# Patient Record
Sex: Male | Born: 1955 | Race: White | Hispanic: No | Marital: Married | Smoking: Current every day smoker
Health system: Southern US, Community
[De-identification: ages and names within clinical notes are randomized; demographics above are authoritative.]

## PROBLEM LIST (undated history)

## (undated) DIAGNOSIS — T4145XA Adverse effect of unspecified anesthetic, initial encounter: Secondary | ICD-10-CM

## (undated) DIAGNOSIS — C61 Malignant neoplasm of prostate: Secondary | ICD-10-CM

## (undated) DIAGNOSIS — M549 Dorsalgia, unspecified: Secondary | ICD-10-CM

## (undated) DIAGNOSIS — E669 Obesity, unspecified: Secondary | ICD-10-CM

## (undated) DIAGNOSIS — Z87442 Personal history of urinary calculi: Secondary | ICD-10-CM

## (undated) DIAGNOSIS — I739 Peripheral vascular disease, unspecified: Secondary | ICD-10-CM

## (undated) DIAGNOSIS — T8859XA Other complications of anesthesia, initial encounter: Secondary | ICD-10-CM

## (undated) HISTORY — PX: CYSTOSCOPY: SUR368

## (undated) HISTORY — PX: HERNIA REPAIR: SHX51

## (undated) HISTORY — PX: TONSILLECTOMY: SUR1361

## (undated) HISTORY — PX: TONSILLECTOMY AND ADENOIDECTOMY: SUR1326

## (undated) HISTORY — PX: CYST EXCISION: SHX5701

## (undated) HISTORY — DX: Peripheral vascular disease, unspecified: I73.9

---

## 2002-11-17 HISTORY — PX: GASTRIC BYPASS: SHX52

## 2014-02-12 DIAGNOSIS — N201 Calculus of ureter: Secondary | ICD-10-CM | POA: Insufficient documentation

## 2017-06-09 ENCOUNTER — Other Ambulatory Visit: Payer: Self-pay | Admitting: Urology

## 2017-06-09 DIAGNOSIS — R310 Gross hematuria: Secondary | ICD-10-CM

## 2017-06-11 ENCOUNTER — Ambulatory Visit
Admission: RE | Admit: 2017-06-11 | Discharge: 2017-06-11 | Disposition: A | Discharge status: Home / Self Care | Payer: BLUE CROSS/BLUE SHIELD | Source: Ambulatory Visit | Attending: Urology | Admitting: Urology

## 2017-06-11 DIAGNOSIS — N202 Calculus of kidney with calculus of ureter: Secondary | ICD-10-CM | POA: Diagnosis not present

## 2017-06-11 DIAGNOSIS — I700 Atherosclerosis of aorta: Secondary | ICD-10-CM | POA: Diagnosis not present

## 2017-06-11 DIAGNOSIS — R310 Gross hematuria: Secondary | ICD-10-CM | POA: Insufficient documentation

## 2017-06-11 DIAGNOSIS — R918 Other nonspecific abnormal finding of lung field: Secondary | ICD-10-CM | POA: Diagnosis not present

## 2017-06-11 MED ORDER — IOPAMIDOL (ISOVUE-300) INJECTION 61%
150.0000 mL | Freq: Once | INTRAVENOUS | Status: AC | PRN
  Administered 2017-06-11: 125 mL via INTRAVENOUS

## 2017-06-16 ENCOUNTER — Encounter: Payer: Self-pay | Admitting: *Deleted

## 2017-06-16 ENCOUNTER — Ambulatory Visit
Admission: RE | Admit: 2017-06-16 | Discharge: 2017-06-16 | Disposition: A | Discharge status: Home / Self Care | Payer: BLUE CROSS/BLUE SHIELD | Source: Ambulatory Visit | Attending: Urology | Admitting: Urology

## 2017-06-16 ENCOUNTER — Ambulatory Visit: Payer: BLUE CROSS/BLUE SHIELD | Admitting: Anesthesiology

## 2017-06-16 ENCOUNTER — Encounter
Admission: RE | Disposition: A | Discharge status: Home / Self Care | Payer: Self-pay | Source: Ambulatory Visit | Attending: Urology

## 2017-06-16 DIAGNOSIS — N201 Calculus of ureter: Secondary | ICD-10-CM | POA: Insufficient documentation

## 2017-06-16 DIAGNOSIS — Z800 Family history of malignant neoplasm of digestive organs: Secondary | ICD-10-CM | POA: Insufficient documentation

## 2017-06-16 DIAGNOSIS — F17210 Nicotine dependence, cigarettes, uncomplicated: Secondary | ICD-10-CM | POA: Insufficient documentation

## 2017-06-16 DIAGNOSIS — J449 Chronic obstructive pulmonary disease, unspecified: Secondary | ICD-10-CM | POA: Insufficient documentation

## 2017-06-16 DIAGNOSIS — M549 Dorsalgia, unspecified: Secondary | ICD-10-CM | POA: Insufficient documentation

## 2017-06-16 DIAGNOSIS — G8929 Other chronic pain: Secondary | ICD-10-CM | POA: Insufficient documentation

## 2017-06-16 HISTORY — PX: URETEROSCOPY WITH HOLMIUM LASER LITHOTRIPSY: SHX6645

## 2017-06-16 HISTORY — PX: CYSTOSCOPY WITH STENT PLACEMENT: SHX5790

## 2017-06-16 HISTORY — DX: Personal history of urinary calculi: Z87.442

## 2017-06-16 SURGERY — URETEROSCOPY, WITH LITHOTRIPSY USING HOLMIUM LASER
Anesthesia: General | Site: Ureter | Laterality: Left | Wound class: Clean Contaminated

## 2017-06-16 MED ORDER — HYOSCYAMINE SULFATE SL 0.125 MG SL SUBL: 0.1250 mg | SUBLINGUAL_TABLET | SUBLINGUAL | 3 refills | Status: DC | PRN

## 2017-06-16 MED ORDER — FENTANYL CITRATE (PF) 100 MCG/2ML IJ SOLN
INTRAMUSCULAR | Status: AC
  Filled 2017-06-16: qty 2

## 2017-06-16 MED ORDER — CEFAZOLIN (ANCEF) 1 G IV SOLR: 1.0000 g | INTRAVENOUS | Status: DC

## 2017-06-16 MED ORDER — CEFAZOLIN SODIUM-DEXTROSE 1-4 GM/50ML-% IV SOLN
1.0000 g | Freq: Once | INTRAVENOUS | Status: AC
  Administered 2017-06-16: 1 g via INTRAVENOUS

## 2017-06-16 MED ORDER — BELLADONNA ALKALOIDS-OPIUM 16.2-60 MG RE SUPP
RECTAL | Status: AC
  Filled 2017-06-16: qty 1

## 2017-06-16 MED ORDER — FAMOTIDINE 20 MG PO TABS
20.0000 mg | ORAL_TABLET | Freq: Once | ORAL | Status: AC
  Administered 2017-06-16: 20 mg via ORAL

## 2017-06-16 MED ORDER — ROCURONIUM BROMIDE 100 MG/10ML IV SOLN
INTRAVENOUS | Status: DC | PRN
  Administered 2017-06-16: 40 mg via INTRAVENOUS

## 2017-06-16 MED ORDER — LACTATED RINGERS IV SOLN
INTRAVENOUS | Status: DC
  Administered 2017-06-16: 13:00:00 via INTRAVENOUS

## 2017-06-16 MED ORDER — GLYCOPYRROLATE 0.2 MG/ML IJ SOLN
INTRAMUSCULAR | Status: AC
  Filled 2017-06-16: qty 1

## 2017-06-16 MED ORDER — CEFAZOLIN SODIUM-DEXTROSE 1-4 GM/50ML-% IV SOLN
INTRAVENOUS | Status: AC
  Filled 2017-06-16: qty 50

## 2017-06-16 MED ORDER — FENTANYL CITRATE (PF) 100 MCG/2ML IJ SOLN: 25.0000 ug | INTRAMUSCULAR | Status: DC | PRN

## 2017-06-16 MED ORDER — SUGAMMADEX SODIUM 200 MG/2ML IV SOLN
INTRAVENOUS | Status: AC
  Filled 2017-06-16: qty 2

## 2017-06-16 MED ORDER — FAMOTIDINE 20 MG PO TABS
ORAL_TABLET | ORAL | Status: AC
  Administered 2017-06-16: 20 mg via ORAL
  Filled 2017-06-16: qty 1

## 2017-06-16 MED ORDER — LIDOCAINE HCL 2 % EX GEL
CUTANEOUS | Status: AC
  Filled 2017-06-16: qty 10

## 2017-06-16 MED ORDER — MIDAZOLAM HCL 2 MG/2ML IJ SOLN
INTRAMUSCULAR | Status: AC
  Filled 2017-06-16: qty 2

## 2017-06-16 MED ORDER — ONDANSETRON HCL 4 MG/2ML IJ SOLN
INTRAMUSCULAR | Status: AC
  Filled 2017-06-16: qty 2

## 2017-06-16 MED ORDER — ONDANSETRON 8 MG PO TBDP: 4.0000 mg | ORAL_TABLET | Freq: Four times a day (QID) | ORAL | 3 refills | Status: DC | PRN

## 2017-06-16 MED ORDER — MIDAZOLAM HCL 2 MG/2ML IJ SOLN
INTRAMUSCULAR | Status: DC | PRN
  Administered 2017-06-16: 2 mg via INTRAVENOUS

## 2017-06-16 MED ORDER — FUROSEMIDE 10 MG/ML IJ SOLN: 10.0000 mg | Freq: Once | INTRAMUSCULAR | Status: DC

## 2017-06-16 MED ORDER — LIDOCAINE HCL 2 % EX GEL
CUTANEOUS | Status: DC | PRN
  Administered 2017-06-16: 1

## 2017-06-16 MED ORDER — MEPERIDINE HCL 50 MG/ML IJ SOLN: 6.2500 mg | INTRAMUSCULAR | Status: DC | PRN

## 2017-06-16 MED ORDER — IOTHALAMATE MEGLUMINE 43 % IV SOLN
INTRAVENOUS | Status: DC | PRN
  Administered 2017-06-16: 15 mL via URETHRAL

## 2017-06-16 MED ORDER — ROCURONIUM BROMIDE 50 MG/5ML IV SOLN
INTRAVENOUS | Status: AC
  Filled 2017-06-16: qty 1

## 2017-06-16 MED ORDER — GLYCOPYRROLATE 0.2 MG/ML IJ SOLN
INTRAMUSCULAR | Status: DC | PRN
  Administered 2017-06-16: 0.2 mg via INTRAVENOUS

## 2017-06-16 MED ORDER — BELLADONNA ALKALOIDS-OPIUM 16.2-60 MG RE SUPP
RECTAL | Status: DC | PRN
  Administered 2017-06-16: 1 via RECTAL

## 2017-06-16 MED ORDER — SUGAMMADEX SODIUM 200 MG/2ML IV SOLN
INTRAVENOUS | Status: DC | PRN
  Administered 2017-06-16: 140 mg via INTRAVENOUS

## 2017-06-16 MED ORDER — KETOROLAC TROMETHAMINE 30 MG/ML IJ SOLN
INTRAMUSCULAR | Status: AC
  Filled 2017-06-16: qty 1

## 2017-06-16 MED ORDER — ONDANSETRON HCL 4 MG/2ML IJ SOLN
INTRAMUSCULAR | Status: DC | PRN
  Administered 2017-06-16: 4 mg via INTRAVENOUS

## 2017-06-16 MED ORDER — EPHEDRINE SULFATE 50 MG/ML IJ SOLN
INTRAMUSCULAR | Status: DC | PRN
  Administered 2017-06-16: 5 mg via INTRAVENOUS

## 2017-06-16 MED ORDER — PROMETHAZINE HCL 25 MG/ML IJ SOLN: 6.2500 mg | INTRAMUSCULAR | Status: DC | PRN

## 2017-06-16 MED ORDER — TAMSULOSIN HCL 0.4 MG PO CAPS: 0.4000 mg | ORAL_CAPSULE | Freq: Every day | ORAL | 11 refills | Status: DC

## 2017-06-16 MED ORDER — CIPROFLOXACIN HCL 500 MG PO TABS: 500.0000 mg | ORAL_TABLET | Freq: Two times a day (BID) | ORAL | 0 refills | Status: DC

## 2017-06-16 MED ORDER — PROPOFOL 10 MG/ML IV BOLUS
INTRAVENOUS | Status: AC
  Filled 2017-06-16: qty 20

## 2017-06-16 MED ORDER — PROPOFOL 10 MG/ML IV BOLUS
INTRAVENOUS | Status: DC | PRN
  Administered 2017-06-16: 150 mg via INTRAVENOUS
  Administered 2017-06-16: 50 mg via INTRAVENOUS

## 2017-06-16 MED ORDER — FENTANYL CITRATE (PF) 100 MCG/2ML IJ SOLN
INTRAMUSCULAR | Status: DC | PRN
  Administered 2017-06-16: 100 ug via INTRAVENOUS

## 2017-06-16 SURGICAL SUPPLY — 27 items
BAG DRAIN CYSTO-URO LG1000N (MISCELLANEOUS) ×2 IMPLANT
CATH URETL 5X70 OPEN END (CATHETERS) ×2 IMPLANT
CNTNR SPEC 2.5X3XGRAD LEK (MISCELLANEOUS) ×1
CONRAY 43 FOR UROLOGY 50M (MISCELLANEOUS) ×2 IMPLANT
CONT SPEC 4OZ STER OR WHT (MISCELLANEOUS) ×1
CONTAINER SPEC 2.5X3XGRAD LEK (MISCELLANEOUS) ×1 IMPLANT
FIBER LASER 365 (Laser) ×2 IMPLANT
GLOVE BIO SURGEON STRL SZ7 (GLOVE) ×4 IMPLANT
GLOVE BIO SURGEON STRL SZ7.5 (GLOVE) ×2 IMPLANT
GOWN STRL REUS W/ TWL LRG LVL4 (GOWN DISPOSABLE) ×1 IMPLANT
GOWN STRL REUS W/ TWL XL LVL3 (GOWN DISPOSABLE) ×1 IMPLANT
GOWN STRL REUS W/TWL LRG LVL4 (GOWN DISPOSABLE) ×1
GOWN STRL REUS W/TWL XL LVL3 (GOWN DISPOSABLE) ×1
GOWN STRL REUS W/TWL XL LVL4 (GOWN DISPOSABLE) ×2 IMPLANT
GUIDEWIRE STR ZIPWIRE 035X150 (MISCELLANEOUS) ×2 IMPLANT
KIT RM TURNOVER CYSTO AR (KITS) ×2 IMPLANT
PACK CYSTO AR (MISCELLANEOUS) ×2 IMPLANT
SET CYSTO W/LG BORE CLAMP LF (SET/KITS/TRAYS/PACK) ×2 IMPLANT
SOL .9 NS 3000ML IRR  AL (IV SOLUTION) ×1
SOL .9 NS 3000ML IRR UROMATIC (IV SOLUTION) ×1 IMPLANT
SOL PREP PVP 2OZ (MISCELLANEOUS) ×2
SOLUTION PREP PVP 2OZ (MISCELLANEOUS) ×1 IMPLANT
STENT URET 6FRX24 CONTOUR (STENTS) ×2 IMPLANT
STENT URET 6FRX26 CONTOUR (STENTS) IMPLANT
SURGILUBE 2OZ TUBE FLIPTOP (MISCELLANEOUS) ×2 IMPLANT
SYRINGE IRR TOOMEY STRL 70CC (SYRINGE) ×2 IMPLANT
WATER STERILE IRR 1000ML POUR (IV SOLUTION) ×2 IMPLANT

## 2017-06-16 NOTE — Anesthesia Preprocedure Evaluation (Signed)
Anesthesia Evaluation  Patient identified by MRN, date of birth, ID band Patient awake    Reviewed: Allergy & Precautions, NPO status , Patient's Chart, lab work & pertinent test results  History of Anesthesia Complications Negative for: history of anesthetic complications  Airway Mallampati: II  TM Distance: >3 FB Neck ROM: Full    Dental no notable dental hx.    Pulmonary neg sleep apnea, neg COPD, Current Smoker,    breath sounds clear to auscultation- rhonchi (-) wheezing      Cardiovascular Exercise Tolerance: Good (-) hypertension(-) CAD, (-) Past MI and (-) Cardiac Stents  Rhythm:Regular Rate:Normal - Systolic murmurs and - Diastolic murmurs    Neuro/Psych negative neurological ROS  negative psych ROS   GI/Hepatic negative GI ROS, Neg liver ROS,   Endo/Other  negative endocrine ROSneg diabetes  Renal/GU Renal disease: nephrolithiasis.     Musculoskeletal negative musculoskeletal ROS (+)   Abdominal (+) - obese,   Peds  Hematology negative hematology ROS (+)   Anesthesia Other Findings Past Medical History: No date: History of kidney stones     Comment:  5th time treating stones in 15 years   Reproductive/Obstetrics                             Anesthesia Physical Anesthesia Plan  ASA: II  Anesthesia Plan: General   Post-op Pain Management:    Induction: Intravenous  PONV Risk Score and Plan: 0 and Ondansetron  Airway Management Planned: Oral ETT  Additional Equipment:   Intra-op Plan:   Post-operative Plan: Extubation in OR  Informed Consent: I have reviewed the patients History and Physical, chart, labs and discussed the procedure including the risks, benefits and alternatives for the proposed anesthesia with the patient or authorized representative who has indicated his/her understanding and acceptance.   Dental advisory given  Plan Discussed with: CRNA and  Anesthesiologist  Anesthesia Plan Comments:         Anesthesia Quick Evaluation

## 2017-06-16 NOTE — H&P (Signed)
Date of Initial H&P: 06/15/17  History reviewed, patient examined, no change in status, stable for surgery.  

## 2017-06-16 NOTE — Transfer of Care (Signed)
Immediate Anesthesia Transfer of Care Note  Patient: Orpah GreekClarence M Ackroyd  Procedure(s) Performed: Procedure(s): URETEROSCOPY WITH HOLMIUM LASER LITHOTRIPSY (Left) CYSTOSCOPY WITH STENT PLACEMENT (Left)  Patient Location: PACU  Anesthesia Type:General  Level of Consciousness: drowsy and patient cooperative  Airway & Oxygen Therapy: Patient Spontanous Breathing and Patient connected to face mask oxygen  Post-op Assessment: Report given to RN and Post -op Vital signs reviewed and stable  Post vital signs: Reviewed and stable  Last Vitals:  Vitals:   06/16/17 1210 06/16/17 1531  BP: 134/89 123/80  Pulse: (!) 47 (!) 51  Resp: 16   Temp: 36.7 C (!) 36.2 C    Last Pain:  Vitals:   06/16/17 1210  TempSrc: Tympanic  PainSc: 1       Patients Stated Pain Goal: 0 (06/16/17 1210)  Complications: No apparent anesthesia complications

## 2017-06-16 NOTE — Anesthesia Procedure Notes (Signed)
Procedure Name: Intubation Date/Time: 06/16/2017 1:47 PM Performed by: Jonna Clark Pre-anesthesia Checklist: Patient identified, Patient being monitored, Timeout performed, Emergency Drugs available and Suction available Patient Re-evaluated:Patient Re-evaluated prior to induction Oxygen Delivery Method: Circle system utilized Preoxygenation: Pre-oxygenation with 100% oxygen Induction Type: IV induction Ventilation: Mask ventilation without difficulty Laryngoscope Size: Mac and 3 Grade View: Grade II Tube type: Oral Tube size: 7.5 mm Number of attempts: 1 Placement Confirmation: ETT inserted through vocal cords under direct vision,  positive ETCO2 and breath sounds checked- equal and bilateral Secured at: 21 cm Tube secured with: Tape Dental Injury: Teeth and Oropharynx as per pre-operative assessment  Comments: Put lubricant on dry skin of right eye before taping closed

## 2017-06-16 NOTE — Discharge Instructions (Signed)
AMBULATORY SURGERY  DISCHARGE INSTRUCTIONS   1) The drugs that you were given will stay in your system until tomorrow so for the next 24 hours you should not:  A) Drive an automobile B) Make any legal decisions C) Drink any alcoholic beverage   2) You may resume regular meals tomorrow.  Today it is better to start with liquids and gradually work up to solid foods.  You may eat anything you prefer, but it is better to start with liquids, then soup and crackers, and gradually work up to solid foods.   3) Please notify your doctor immediately if you have any unusual bleeding, trouble breathing, redness and pain at the surgery site, drainage, fever, or pain not relieved by medication. 4)   5) Your post-operative visit with Dr.                                     is: Date:                        Time:    Please call to schedule your post-operative visit.  6) Additional Instructions:       Kidney Stones Kidney stones (urolithiasis) are rock-like masses that form inside of the kidneys. Kidneys are organs that make pee (urine). A kidney stone can cause very bad pain and can block the flow of pee. The stone usually leaves your body (passes) through your pee. You may need to have a doctor take out the stone. Follow these instructions at home: Eating and drinking  Drink enough fluid to keep your pee clear or pale yellow. This will help you pass the stone.  If told by your doctor, change the foods you eat (your diet). This may include: ? Limiting how much salt (sodium) you eat. ? Eating more fruits and vegetables. ? Limiting how much meat, poultry, fish, and eggs you eat.  Follow instructions from your doctor about eating or drinking restrictions. General instructions  Collect pee samples as told by your doctor. You may need to collect a pee sample: ? 24 hours after a stone comes out. ? 8-12 weeks after a stone comes out, and every 6-12 months after that.  Strain your pee every  time you pee (urinate), for as long as told. Use the strainer that your doctor recommends.  Do not throw out the stone. Keep it so that it can be tested by your doctor.  Take over-the-counter and prescription medicines only as told by your doctor.  Keep all follow-up visits as told by your doctor. This is important. You may need follow-up tests. Preventing kidney stones To prevent another kidney stone:  Drink enough fluid to keep your pee clear or pale yellow. This is the best way to prevent kidney stones.  Eat healthy foods.  Avoid certain foods as told by your doctor. You may be told to eat less protein.  Stay at a healthy weight.  Contact a doctor if:  You have pain that gets worse or does not get better with medicine. Get help right away if:  You have a fever or chills.  You get very bad pain.  You get new pain in your belly (abdomen).  You pass out (faint).  You cannot pee. This information is not intended to replace advice given to you by your health care provider. Make sure you discuss any questions you have  with your health care provider. Document Released: 04/21/2008 Document Revised: 07/22/2016 Document Reviewed: 07/22/2016 Elsevier Interactive Patient Education  2017 [employer]   Laser Therapy for Kidney Stones, Care After Refer to this sheet in the next few weeks. These instructions provide you with information on caring for yourself after your procedure. Your health care provider may also give you more specific instructions. Your treatment has been planned according to current medical practices, but problems sometimes occur. Call your health care provider if you have any problems or questions after your procedure. What can I expect after the procedure? After the procedure, it is common to have:  Pain.  A burning sensation while urinating.  Small amounts of blood in your urine.  A need to urinate frequently.  Pieces of kidney stone in your  urine.  Mild discomfort when urinating that is felt in the tip of the penis in men or in the back. You may experience this if you have a flexible tube (stent) in your ureter.  Follow these instructions at home:  Take over-the-counter and prescription medicines only as told by your health care provider.  Take your antibiotic medicine as told by your health care provider. Do not stop taking the antibiotic even if you start to feel better.  Drink enough fluid to keep your urine clear or pale yellow. Your health care provider may recommend drinking two 8-ounce glasses of water per hour for a few hours after your procedure.  Return to your normal activities as told by your health care provider. Ask your health care provider what activities are safe for you.  If your health care provider approves, you may take a warm bath to ease discomfort and burning.  Do notdrivefor 24 hours if you were given a medicine to help you relax (sedative).  Keep all follow-up visits as told by your health care provider. This is important. If you have a stent, you will need to return to your health care provider to have the stent removed. Contact a health care provider if:  You have pain or a burning feeling that lasts more than two days.  You feel nauseous.  You vomit more and more often.  You have difficulty urinating.  You have pain that gets worse or does not get better with medicine. Get help right away if:  You are unable to urinate, even if your bladder feels full.  You have bright red blood or blood clots in your urine.  You have more blood in your urine.  You have severe pain or discomfort.  You have a fever or shaking chills.  You have abdominal pain. This information is not intended to replace advice given to you by your health care provider. Make sure you discuss any questions you have with your health care provider. Document Released: 11/30/2015 Document Revised: 04/10/2016 Document  Reviewed: 09/27/2015 Elsevier Interactive Patient Education  [employer]2018 [employer]   Ureteroscopy Ureteroscopy is a procedure to check for and treat problems inside part of the urinary tract. In this procedure, a thin, tube-shaped instrument with a light at the end (ureteroscope) is used to look at the inside of the kidneys and the ureters, which are the tubes that carry urine from the kidneys to the bladder. The ureteroscope is inserted into one or both of the ureters. You may need this procedure if you have frequent urinary tract infections (UTIs), blood in your urine, or a stone in one of your ureters. A ureteroscopy can be done to find  the cause of urine blockage in a ureter and to evaluate other abnormalities inside the ureters or kidneys. If stones are found, they can be removed during the procedure. Polyps, abnormal tissue, and some types of tumors can also be removed or treated. The ureteroscope may also have a tool to remove tissue to be checked for disease under a microscope (biopsy). Tell a health care provider about:  Any allergies you have.  All medicines you are taking, including vitamins, herbs, eye drops, creams, and over-the-counter medicines.  Any problems you or family members have had with anesthetic medicines.  Any blood disorders you have.  Any surgeries you have had.  Any medical conditions you have.  Whether you are pregnant or may be pregnant. What are the risks? Generally, this is a safe procedure. However, problems may occur, including:  Bleeding.  Infection.  Allergic reactions to medicines.  Scarring that narrows the ureter (stricture).  Creating a hole in the ureter (perforation).  What happens before the procedure? Staying hydrated Follow instructions from your health care provider about hydration, which may include:  Up to 2 hours before the procedure - you may continue to drink clear liquids, such as water, clear fruit juice, black coffee, and  plain tea.  Eating and drinking restrictions Follow instructions from your health care provider about eating and drinking, which may include:  8 hours before the procedure - stop eating heavy meals or foods such as meat, fried foods, or fatty foods.  6 hours before the procedure - stop eating light meals or foods, such as toast or cereal.  6 hours before the procedure - stop drinking milk or drinks that contain milk.  2 hours before the procedure - stop drinking clear liquids.  Medicines  Ask your health care provider about: ? Changing or stopping your regular medicines. This is especially important if you are taking diabetes medicines or blood thinners. ? Taking medicines such as aspirin and ibuprofen. These medicines can thin your blood. Do not take these medicines before your procedure if your health care provider instructs you not to.  You may be given antibiotic medicine to help prevent infection. General instructions  You may have a urine sample taken to check for infection.  Plan to have someone take you home from the hospital or clinic. What happens during the procedure?  To reduce your risk of infection: ? Your health care team will wash or sanitize their hands. ? Your skin will be washed with soap.  An IV tube will be inserted into one of your veins.  You will be given one of the following: ? A medicine to help you relax (sedative). ? A medicine to make you fall asleep (general anesthetic). ? A medicine that is injected into your spine to numb the area below and slightly above the injection site (spinal anesthetic).  To lower your risk of infection, you may be given an antibiotic medicine by an injection or through the IV tube.  The opening from which you urinate (urethra) will be cleaned with a germ-killing solution.  The ureteroscope will be passed through your urethra into your bladder.  A salt-water solution will flow through the ureteroscope to fill your  bladder. This will help the health care provider see the openings of your ureters more clearly.  Then, the ureteroscope will be passed into your ureter. ? If a growth is found, a piece of it may be removed so it can be examined under a microscope (biopsy). ? If  a stone is found, it may be removed through the ureteroscope, or the stone may be broken up using a laser, shock waves, or electrical energy. ? In some cases, if the ureter is too small, a tube may be inserted that keeps the ureter open (ureteral stent). The stent may be left in place for 1 or 2 weeks to keep the ureter open, and then the ureteroscopy procedure will be performed.  The scope will be removed, and your bladder will be emptied. The procedure may vary among health care providers and hospitals. What happens after the procedure?  Your blood pressure, heart rate, breathing rate, and blood oxygen level will be monitored until the medicines you were given have worn off.  You may be asked to urinate.  Donot drive for 24 hours if you were given a sedative. This information is not intended to replace advice given to you by your health care provider. Make sure you discuss any questions you have with your health care provider. Document Released: 11/08/2013 Document Revised: 08/19/2016 Document Reviewed: 08/15/2016 Elsevier Interactive Patient Education  2018 [employer]  Ureteral Stent Implantation, Care After Refer to this sheet in the next few weeks. These instructions provide you with information about caring for yourself after your procedure. Your health care provider may also give you more specific instructions. Your treatment has been planned according to current medical practices, but problems sometimes occur. Call your health care provider if you have any problems or questions after your procedure. What can I expect after the procedure? After the procedure, it is common to have:  Nausea.  Mild pain when you urinate.  You may feel this pain in your lower back or lower abdomen. Pain should stop within a few minutes after you urinate. This may last for up to 1 week.  A small amount of blood in your urine for several days.  Follow these instructions at home:  Medicines  Take over-the-counter and prescription medicines only as told by your health care provider.  If you were prescribed an antibiotic medicine, take it as told by your health care provider. Do not stop taking the antibiotic even if you start to feel better.  Do not drive for 24 hours if you received a sedative.  Do not drive or operate heavy machinery while taking prescription pain medicines. Activity  Return to your normal activities as told by your health care provider. Ask your health care provider what activities are safe for you.  Do not lift anything that is heavier than 10 lb (4.5 kg). Follow this limit for 1 week after your procedure, or for as long as told by your health care provider. General instructions  Watch for any blood in your urine. Call your health care provider if the amount of blood in your urine increases.  If you have a catheter: ? Follow instructions from your health care provider about taking care of your catheter and collection bag. ? Do not take baths, swim, or use a hot tub until your health care provider approves.  Drink enough fluid to keep your urine clear or pale yellow.  Keep all follow-up visits as told by your health care provider. This is important. Contact a health care provider if:  You have pain that gets worse or does not get better with medicine, especially pain when you urinate.  You have difficulty urinating.  You feel nauseous or you vomit repeatedly during a period of more than 2 days after the procedure. Get help  right away if:  Your urine is dark red or has blood clots in it.  You are leaking urine (have incontinence).  The end of the stent comes out of your urethra.  You cannot  urinate.  You have sudden, sharp, or severe pain in your abdomen or lower back.  You have a fever. This information is not intended to replace advice given to you by your health care provider. Make sure you discuss any questions you have with your health care provider. Document Released: 07/06/2013 Document Revised: 04/10/2016 Document Reviewed: 05/18/2015 Elsevier Interactive Patient Education  [employer].

## 2017-06-16 NOTE — Anesthesia Postprocedure Evaluation (Signed)
Anesthesia Post Note  Patient: Thomas Huerta  Procedure(s) Performed: Procedure(s) (LRB): URETEROSCOPY WITH HOLMIUM LASER LITHOTRIPSY (Left) CYSTOSCOPY WITH STENT PLACEMENT (Left)  Patient location during evaluation: PACU Anesthesia Type: General Level of consciousness: awake and alert and oriented Pain management: pain level controlled Vital Signs Assessment: post-procedure vital signs reviewed and stable Respiratory status: spontaneous breathing Cardiovascular status: blood pressure returned to baseline Anesthetic complications: no     Last Vitals:  Vitals:   06/16/17 1615 06/16/17 1630  BP: (!) 149/91 (!) 143/95  Pulse: (!) 55 (!) 44  Resp: 20 13  Temp:      Last Pain:  Vitals:   06/16/17 1630  TempSrc:   PainSc: 0-No pain                 Sanyiah Kanzler

## 2017-06-16 NOTE — Anesthesia Post-op Follow-up Note (Cosign Needed)
Anesthesia QCDR form completed.        

## 2017-06-16 NOTE — Progress Notes (Signed)
Patient was on cpap about 20 years ago until he had a T & A along with gastric bypass

## 2017-06-16 NOTE — Op Note (Signed)
Preoperative diagnosis: Left ureterolithiasis  Postoperative diagnosis: Same   Procedure: 1. Left ureteroscopic ureterolithotomy with holmium laser lithotripsy                      2. Left double pigtail ureteral stent placement                      3. Fluoroscopy    Surgeon: Suszanne ConnersMichael R. Evelene CroonWolff MD  Anesthesia: General  Indications:See the history and physical. After informed consent the above procedure(s) were requested     Technique and findings: After adequate general anesthesia been obtained the patient was placed into dorsal lithotomy position and the perineum was prepped and draped in the usual fashion. Fluoroscopy confirmed the presence of a 2 x 1 cm stone in distal left ureter. Bilateral renal stones were also present. The 3021 French the scope was coupled to the camera and visually advanced into the bladder. Bladder was thoroughly inspected. Both ureteral orifices were identified and had clear efflux. No bladder tumors were identified. A 0.035 Glidewire was then advanced up the ureter beyond the stone and curled into the renal pelvis. The cystoscope was removed taking care to leave the guidewire in position. The 4.5 short semirigid ureteroscope is coupled the camera and then visually advanced into the distal ureter. The distal end of the stone was identified. The  365  holmium laser fiber was introduced through the scope and set at our level of 1 and a frequency of 5. The stone was powdered and fragmented into fragments less than 1 mm in size. The ureteroscope was then removed and the cystoscope backloaded over the guidewire. A 6 French by 24 cm double pigtail stent was then advanced over the guidewire and position in the ureter. Guidewire was then removed taking care leaving the stent in place. The bladder was drained and cystoscope was removed. 10 cc of viscous Xylocaine was instilled within the urethra. A B&O suppository was placed. The procedure was then terminated and patient transferred to  the recovery room in stable condition.

## 2017-06-23 ENCOUNTER — Ambulatory Visit: Payer: Self-pay

## 2017-06-24 ENCOUNTER — Encounter: Payer: Self-pay | Admitting: *Deleted

## 2017-06-25 ENCOUNTER — Ambulatory Visit
Admission: RE | Admit: 2017-06-25 | Discharge: 2017-06-25 | Disposition: A | Discharge status: Home / Self Care | Payer: BLUE CROSS/BLUE SHIELD | Source: Ambulatory Visit | Attending: Urology | Admitting: Urology

## 2017-06-25 ENCOUNTER — Encounter: Payer: Self-pay | Admitting: *Deleted

## 2017-06-25 ENCOUNTER — Encounter
Admission: RE | Disposition: A | Discharge status: Home / Self Care | Payer: Self-pay | Source: Ambulatory Visit | Attending: Urology

## 2017-06-25 DIAGNOSIS — Z800 Family history of malignant neoplasm of digestive organs: Secondary | ICD-10-CM | POA: Insufficient documentation

## 2017-06-25 DIAGNOSIS — Z79899 Other long term (current) drug therapy: Secondary | ICD-10-CM | POA: Insufficient documentation

## 2017-06-25 DIAGNOSIS — Z9884 Bariatric surgery status: Secondary | ICD-10-CM | POA: Insufficient documentation

## 2017-06-25 DIAGNOSIS — N200 Calculus of kidney: Secondary | ICD-10-CM | POA: Insufficient documentation

## 2017-06-25 DIAGNOSIS — Z7982 Long term (current) use of aspirin: Secondary | ICD-10-CM | POA: Diagnosis not present

## 2017-06-25 DIAGNOSIS — F17210 Nicotine dependence, cigarettes, uncomplicated: Secondary | ICD-10-CM | POA: Insufficient documentation

## 2017-06-25 HISTORY — DX: Obesity, unspecified: E66.9

## 2017-06-25 HISTORY — DX: Dorsalgia, unspecified: M54.9

## 2017-06-25 HISTORY — PX: EXTRACORPOREAL SHOCK WAVE LITHOTRIPSY: SHX1557

## 2017-06-25 SURGERY — LITHOTRIPSY, ESWL
Anesthesia: Moderate Sedation | Laterality: Left

## 2017-06-25 MED ORDER — DIPHENHYDRAMINE HCL 25 MG PO CAPS
25.0000 mg | ORAL_CAPSULE | ORAL | Status: AC
  Administered 2017-06-25: 25 mg via ORAL

## 2017-06-25 MED ORDER — MIDAZOLAM HCL 2 MG/2ML IJ SOLN
1.0000 mg | Freq: Once | INTRAMUSCULAR | Status: AC
  Administered 2017-06-25: 1 mg via INTRAMUSCULAR

## 2017-06-25 MED ORDER — FUROSEMIDE 10 MG/ML IJ SOLN
10.0000 mg | Freq: Once | INTRAMUSCULAR | Status: AC
  Administered 2017-06-25: 10 mg via INTRAVENOUS

## 2017-06-25 MED ORDER — ONDANSETRON 8 MG PO TBDP: 4.0000 mg | ORAL_TABLET | Freq: Four times a day (QID) | ORAL | 3 refills | Status: DC | PRN

## 2017-06-25 MED ORDER — MORPHINE SULFATE (PF) 10 MG/ML IV SOLN
INTRAVENOUS | Status: AC
  Administered 2017-06-25: 10 mg via INTRAMUSCULAR
  Filled 2017-06-25: qty 1

## 2017-06-25 MED ORDER — MORPHINE SULFATE (PF) 10 MG/ML IV SOLN
10.0000 mg | Freq: Once | INTRAVENOUS | Status: AC
  Administered 2017-06-25: 10 mg via INTRAMUSCULAR

## 2017-06-25 MED ORDER — DIPHENHYDRAMINE HCL 25 MG PO CAPS
ORAL_CAPSULE | ORAL | Status: AC
  Administered 2017-06-25: 25 mg via ORAL
  Filled 2017-06-25: qty 1

## 2017-06-25 MED ORDER — DEXTROSE 5 % IV SOLN
1.0000 g | Freq: Once | INTRAVENOUS | Status: DC
  Filled 2017-06-25: qty 10

## 2017-06-25 MED ORDER — PROMETHAZINE HCL 25 MG/ML IJ SOLN
25.0000 mg | Freq: Once | INTRAMUSCULAR | Status: AC
  Administered 2017-06-25: 25 mg via INTRAMUSCULAR

## 2017-06-25 MED ORDER — PROMETHAZINE HCL 25 MG/ML IJ SOLN
INTRAMUSCULAR | Status: AC
  Administered 2017-06-25: 25 mg via INTRAMUSCULAR
  Filled 2017-06-25: qty 1

## 2017-06-25 MED ORDER — FUROSEMIDE 10 MG/ML IJ SOLN
INTRAMUSCULAR | Status: AC
  Filled 2017-06-25: qty 2

## 2017-06-25 MED ORDER — DEXTROSE-NACL 5-0.45 % IV SOLN: INTRAVENOUS | Status: DC

## 2017-06-25 MED ORDER — MIDAZOLAM HCL 2 MG/2ML IJ SOLN
INTRAMUSCULAR | Status: AC
  Administered 2017-06-25: 1 mg via INTRAMUSCULAR
  Filled 2017-06-25: qty 2

## 2017-06-25 NOTE — Discharge Instructions (Addendum)
Kidney Stones Kidney stones (urolithiasis) are solid, rock-like deposits that form inside of the organs that make urine (kidneys). A kidney stone may form in a kidney and move into the bladder, where it can cause intense pain and block the flow of urine. Kidney stones are created when high levels of certain minerals are found in the urine. They are usually passed through urination, but in some cases, medical treatment may be needed to remove them. What are the causes? Kidney stones may be caused by:  A condition in which certain glands produce too much parathyroid hormone (primary hyperparathyroidism), which causes too much calcium buildup in the blood.  Buildup of uric acid crystals in the bladder (hyperuricosuria). Uric acid is a chemical that the body produces when you eat certain foods. It usually exits the body in the urine.  Narrowing (stricture) of one or both of the tubes that drain urine from the kidneys to the bladder (ureters).  A kidney blockage that is present at birth (congenital obstruction).  Past surgery on the kidney or the ureters, such as gastric bypass surgery.  What increases the risk? The following factors make you more likely to develop kidney stones:  Having had a kidney stone in the past.  Having a family history of kidney stones.  Not drinking enough water.  Eating a diet that is high in protein, salt (sodium), or sugar.  Being overweight or obese.  What are the signs or symptoms? Symptoms of a kidney stone may include:  Nausea.  Vomiting.  Blood in the urine (hematuria).  Pain in the side of the abdomen, right below the ribs (flank pain). Pain usually spreads (radiates) to the groin.  Needing to urinate frequently or urgently.  How is this diagnosed? This condition may be diagnosed based on:  Your medical history.  A physical exam.  Blood tests.  Urine tests.  CT scan.  Abdominal X-ray.  A procedure to examine the inside of the  bladder (cystoscopy).  How is this treated? Treatment for kidney stones depends on the size, location, and makeup of the stones. Treatment may involve:  Analyzing your urine before and after you pass the stone through urination.  Being monitored at the hospital until you pass the stone through urination.  Increasing your fluid intake and decreasing the amount of calcium and protein in your diet.  A procedure to break up kidney stones in the bladder using: ? A focused beam of light (laser therapy). ? Shock waves (extracorporeal shock wave lithotripsy).  Surgery to remove kidney stones. This may be needed if you have severe pain or have stones that block your urinary tract.  Follow these instructions at home: Eating and drinking   Drink enough fluid to keep your urine clear or pale yellow. This will help you to pass the kidney stone.  If directed, change your diet. This may include: ? Limiting how much sodium you eat. ? Eating more fruits and vegetables. ? Limiting how much meat, poultry, fish, and eggs you eat.  Follow instructions from your health care provider about eating or drinking restrictions. General instructions  Collect urine samples as told by your health care provider. You may need to collect a urine sample: ? 24 hours after you pass the stone. ? 8-12 weeks after passing the kidney stone, and every 6-12 months after that.  Strain your urine every time you urinate, for as long as directed. Use the strainer that your health care provider recommends.  Do not throw out  the kidney stone after passing it. Keep the stone so it can be tested by your health care provider. Testing the makeup of your kidney stone may help prevent you from getting kidney stones in the future. °· Take over-the-counter and prescription medicines only as told by your health care provider. °· Keep all follow-up visits as told by your health care provider. This is important. You may need follow-up  X-rays or ultrasounds to make sure that your stone has passed. °How is this prevented? °To prevent another kidney stone: °· Drink enough fluid to keep your urine clear or pale yellow. This is the best way to prevent kidney stones. °· Eat a healthy diet and follow recommendations from your health care provider about foods to avoid. You may be instructed to eat a low-protein diet. Recommendations vary depending on the type of kidney stone that you have. °· Maintain a healthy weight. ° °Contact a health care provider if: °· You have pain that gets worse or does not get better with medicine. °Get help right away if: °· You have a fever or chills. °· You develop severe pain. °· You develop new abdominal pain. °· You faint. °· You are unable to urinate. °This information is not intended to replace advice given to you by your health care provider. Make sure you discuss any questions you have with your health care provider. °Document Released: 11/03/2005 Document Revised: 05/23/2016 Document Reviewed: 04/18/2016 °Elsevier Interactive Patient Education © 2017 [employer] ° ° °Lithotripsy, Care After °This sheet gives you information about how to care for yourself after your procedure. Your health care provider may also give you more specific instructions. If you have problems or questions, contact your health care provider. °What can I expect after the procedure? °After the procedure, it is common to have: °· Some blood in your urine. This should only last for a few days. °· Soreness in your back, sides, or upper abdomen for a few days. °· Blotches or bruises on your back where the pressure wave entered the skin. °· Pain, discomfort, or nausea when pieces (fragments) of the kidney stone move through the tube that carries urine from the kidney to the bladder (ureter). Stone fragments may pass soon after the procedure, but they may continue to pass for up to 4-8 weeks. °? If you have severe pain or nausea, contact your  health care provider. This may be caused by a large stone that was not broken up, and this may mean that you need more treatment. °· Some pain or discomfort during urination. °· Some pain or discomfort in the lower abdomen or (in men) at the base of the penis. ° °Follow these instructions at home: °Medicines °· Take over-the-counter and prescription medicines only as told by your health care provider. °· If you were prescribed an antibiotic medicine, take it as told by your health care provider. Do not stop taking the antibiotic even if you start to feel better. °· Do not drive for 24 hours if you were given a medicine to help you relax (sedative). °· Do not drive or use heavy machinery while taking prescription pain medicine. °Eating and drinking °· Drink enough water and fluids to keep your urine clear or pale yellow. This helps any remaining pieces of the stone to pass. It can also help prevent new stones from forming. °· Eat plenty of fresh fruits and vegetables. °· Follow instructions from your health care provider about eating and drinking restrictions. You may be instructed: °?   To reduce how much salt (sodium) you eat or drink. Check ingredients and nutrition facts on packaged foods and beverages. ? To reduce how much meat you eat.  Eat the recommended amount of calcium for your age and gender. Ask your health care provider how much calcium you should have. General instructions  Get plenty of rest.  Most people can resume normal activities 1-2 days after the procedure. Ask your health care provider what activities are safe for you.  If directed, strain all urine through the strainer that was provided by your health care provider. ? Keep all fragments for your health care provider to see. Any stones that are found may be sent to a medical lab for examination. The stone may be as small as a grain of salt.  Keep all follow-up visits as told by your health care provider. This is important. Contact a  health care provider if:  You have pain that is severe or does not get better with medicine.  You have nausea that is severe or does not go away.  You have blood in your urine longer than your health care provider told you to expect.  You have more blood in your urine.  You have pain during urination that does not go away.  You urinate more frequently than usual and this does not go away.  You develop a rash or any other possible signs of an allergic reaction. Get help right away if:  You have severe pain in your back, sides, or upper abdomen.  You have severe pain while urinating.  Your urine is very dark red.  You have blood in your stool (feces).  You cannot pass any urine at all.  You feel a strong urge to urinate after emptying your bladder.  You have a fever or chills.  You develop shortness of breath, difficulty breathing, or chest pain.  You have severe nausea that leads to persistent vomiting.  You faint. Summary  After this procedure, it is common to have some pain, discomfort, or nausea when pieces (fragments) of the kidney stone move through the tube that carries urine from the kidney to the bladder (ureter). If this pain or nausea is severe, however, you should contact your health care provider.  Most people can resume normal activities 1-2 days after the procedure. Ask your health care provider what activities are safe for you.  Drink enough water and fluids to keep your urine clear or pale yellow. This helps any remaining pieces of the stone to pass, and it can help prevent new stones from forming.  If directed, strain your urine and keep all fragments for your health care provider to see. Fragments or stones may be as small as a grain of salt.  Get help right away if you have severe pain in your back, sides, or upper abdomen or have severe pain while urinating. This information is not intended to replace advice given to you by your health care provider.  Make sure you discuss any questions you have with your health care provider. Document Released: 11/23/2007 Document Revised: 09/24/2016 Document Reviewed: 09/24/2016 Elsevier Interactive Patient Education  2017 [place].   Lithotripsy Lithotripsy is a treatment that can sometimes help eliminate kidney stones and the pain that they cause. A form of lithotripsy, also known as extracorporeal shock wave lithotripsy, is a nonsurgical procedure that crushes a kidney stone with shock waves. These shock waves pass through your body and focus on the kidney stone. They cause the  kidney stone to break up while it is still in the urinary tract. This makes it easier for the smaller pieces of stone to pass in the urine. °Tell a health care provider about: °· Any allergies you have. °· All medicines you are taking, including vitamins, herbs, eye drops, creams, and over-the-counter medicines. °· Any blood disorders you have. °· Any surgeries you have had. °· Any medical conditions you have. °· Whether you are pregnant or may be pregnant. °· Any problems you or family members have had with anesthetic medicines. °What are the risks? °Generally, this is a safe procedure. However, problems may occur, including: °· Infection. °· Bleeding of the kidney. °· Bruising of the kidney or skin. °· Scarring of the kidney, which can lead to: °? Increased blood pressure. °? Poor kidney function. °? Return (recurrence) of kidney stones. °· Damage to other structures or organs, such as the liver, colon, spleen, or pancreas. °· Blockage (obstruction) of the the tube that carries urine from the kidney to the bladder (ureter). °· Failure of the kidney stone to break into pieces (fragments). ° °What happens before the procedure? °Staying hydrated °Follow instructions from your health care provider about hydration, which may include: °· Up to 2 hours before the procedure - you may continue to drink clear liquids, such as water, clear fruit  juice, black coffee, and plain tea. ° °Eating and drinking restrictions °Follow instructions from your health care provider about eating and drinking, which may include: °· 8 hours before the procedure - stop eating heavy meals or foods such as meat, fried foods, or fatty foods. °· 6 hours before the procedure - stop eating light meals or foods, such as toast or cereal. °· 6 hours before the procedure - stop drinking milk or drinks that contain milk. °· 2 hours before the procedure - stop drinking clear liquids. ° °General instructions °· Plan to have someone take you home from the hospital or clinic. °· Ask your health care provider about: °? Changing or stopping your regular medicines. This is especially important if you are taking diabetes medicines or blood thinners. °? Taking medicines such as aspirin and ibuprofen. These medicines and other NSAIDs can thin your blood. Do not take these medicines for 7 days before your procedure if your health care provider instructs you not to. °· You may have tests, such as: °? Blood tests. °? Urine tests. °? Imaging tests, such as a CT scan. °What happens during the procedure? °· To lower your risk of infection: °? Your health care team will wash or sanitize their hands. °? Your skin will be washed with soap. °· An IV tube will be inserted into one of your veins. This tube will give you fluids and medicines. °· You will be given one or more of the following: °? A medicine to help you relax (sedative). °? A medicine to make you fall asleep (general anesthetic). °· A water-filled cushion may be placed behind your kidney or on your abdomen. In some cases you may be placed in a tub of lukewarm water. °· Your body will be positioned in a way that makes it easy to target the kidney stone. °· A flexible tube with holes in it (stent) may be placed in the ureter. This will help keep urine flowing from the kidney if the fragments of the stone have been blocking the ureter. °· An X-ray  or ultrasound exam will be done to locate your stone. °· Shock waves will be   aimed at the stone. If you are awake, you may feel a tapping sensation as the shock waves pass through your body. The procedure may vary among health care providers and hospitals. What happens after the procedure?  You may have an X-ray to see whether the procedure was able to break up the kidney stone and how much of the stone has passed. If large stone fragments remain after treatment, you may need to have a second procedure at a later time.  Your blood pressure, heart rate, breathing rate, and blood oxygen level will be monitored until the medicines you were given have worn off.  You may be given antibiotics or pain medicine as needed.  If a stent was placed in your ureter during surgery, it may stay in place for a few weeks.  You may need strain your urine to collect pieces of the kidney stone for testing.  You will need to drink plenty of water.  Do not drive for 24 hours if you were given a sedative. Summary  Lithotripsy is a treatment that can sometimes help eliminate kidney stones and the pain that they cause.  A form of lithotripsy, also known as extracorporeal shock wave lithotripsy, is a nonsurgical procedure that crushes a kidney stone with shock waves.  Generally, this is a safe procedure. However, problems may occur, including damage to the kidney or other organs, infection, or obstruction of the tube that carries urine from the kidney to the bladder (ureter).  When you go home, you will need to drink plenty of water. You may be asked to strain your urine to collect pieces of the kidney stone for testing. This information is not intended to replace advice given to you by your health care provider. Make sure you discuss any questions you have with your health care provider. AMBULATORY SURGERY  DISCHARGE INSTRUCTIONS   1) The drugs that you were given will stay in your system until tomorrow so for  the next 24 hours you should not:  A) Drive an automobile B) Make any legal decisions C) Drink any alcoholic beverage   2) You may resume regular meals tomorrow.  Today it is better to start with liquids and gradually work up to solid foods.  You may eat anything you prefer, but it is better to start with liquids, then soup and crackers, and gradually work up to solid foods.   3) Please notify your doctor immediately if you have any unusual bleeding, trouble breathing, redness and pain at the surgery site, drainage, fever, or pain not relieved by medication.    4) Additional Instructions:  Please contact your physician with any problems or Same Day Surgery at [phone], Monday through Friday 6 am to 4 pm, or [place] [facility] Main number at [phone].

## 2017-06-25 NOTE — OR Nursing (Signed)
1615 dr. Evelene CroonWolff notified that patient is out of Lev[place] and that seemed to help after his last procedure.  Left message to call in at [facility] in [place].

## 2017-06-26 ENCOUNTER — Encounter: Payer: Self-pay | Admitting: Urology

## 2017-07-28 ENCOUNTER — Inpatient Hospital Stay: Admission: RE | Admit: 2017-07-28 | Payer: BLUE CROSS/BLUE SHIELD | Source: Ambulatory Visit

## 2017-07-30 ENCOUNTER — Encounter: Admission: RE | Payer: Self-pay | Source: Ambulatory Visit

## 2017-07-30 ENCOUNTER — Ambulatory Visit: Admission: RE | Admit: 2017-07-30 | Payer: BLUE CROSS/BLUE SHIELD | Source: Ambulatory Visit | Admitting: Urology

## 2017-07-30 SURGERY — LITHOTRIPSY, ESWL
Anesthesia: General | Laterality: Left

## 2017-08-05 ENCOUNTER — Encounter: Payer: Self-pay | Admitting: *Deleted

## 2017-08-05 ENCOUNTER — Encounter
Admission: RE | Admit: 2017-08-05 | Discharge: 2017-08-05 | Disposition: A | Discharge status: Home / Self Care | Payer: BLUE CROSS/BLUE SHIELD | Source: Ambulatory Visit | Attending: Urology | Admitting: Urology

## 2017-08-05 DIAGNOSIS — Z833 Family history of diabetes mellitus: Secondary | ICD-10-CM | POA: Diagnosis not present

## 2017-08-05 DIAGNOSIS — F17200 Nicotine dependence, unspecified, uncomplicated: Secondary | ICD-10-CM | POA: Diagnosis not present

## 2017-08-05 DIAGNOSIS — Z800 Family history of malignant neoplasm of digestive organs: Secondary | ICD-10-CM | POA: Diagnosis not present

## 2017-08-05 DIAGNOSIS — N200 Calculus of kidney: Secondary | ICD-10-CM | POA: Diagnosis present

## 2017-08-05 DIAGNOSIS — Z9884 Bariatric surgery status: Secondary | ICD-10-CM | POA: Diagnosis not present

## 2017-08-05 HISTORY — DX: Adverse effect of unspecified anesthetic, initial encounter: T41.45XA

## 2017-08-05 HISTORY — DX: Other complications of anesthesia, initial encounter: T88.59XA

## 2017-08-05 NOTE — Patient Instructions (Signed)
Your procedure is scheduled on: August 06, 2017 Report to Same Day Surgery on the 2nd floor in [facility] at 12:00 pm  REMEMBER: Instructions that are not followed completely may result in serious medical risk up to and including death; or upon the discretion of your surgeon and anesthesiologist your surgery may need to be rescheduled.  Do not eat food or drink liquids after midnight. No gum chewing or hard candies.  You may however, drink CLEAR liquids up to 2 hours before you are scheduled to arrive at the hospital for your procedure.  Do not drink clear liquids within 2 hours of your scheduled arrival to the hospital as this may lead to your procedure being delayed or rescheduled.  Clear liquids include: - water  - apple juice without pulp - clear gatorade - black coffee or tea (NO milk, creamers, sugars) DO NOT drink anything not on this list.  Type 1 and Type 2 diabetics should only drink water.  No Alcohol for 24 hours before or after surgery.  No Smoking for 24 hours prior to surgery.  Notify your doctor if there is any change in your medical condition (cold, fever, infection).  Do not wear jewelry, make-up, hairpins, clips or nail polish.  Do not wear lotions, powders, or perfumes.   Do not shave 48 hours prior to surgery. Men may shave face and neck.  Contacts and dentures may not be worn into surgery.  Do not bring valuables to the hospital. [facility] is not responsible for any belongings or valuables.   TAKE THESE MEDICATIONS THE MORNING OF SURGERY WITH A SIP OF WATER: NONE   Stop Anti-inflammatories such as Advil, Aleve, Ibuprofen, Motrin, Naproxen, Naprosyn, Goodie powder, or aspirin products. (May take Tylenol if needed.)  Stop supplements until after surgery.  If you are being discharged the day of surgery, you will not be allowed to drive home. You will need someone to drive you home and stay with you that night.   If you are taking public  transportation, you will need to have a responsible adult to with you.  Please call the number above if you have any questions about these instructions.

## 2017-08-06 ENCOUNTER — Ambulatory Visit: Payer: BLUE CROSS/BLUE SHIELD | Admitting: Registered Nurse

## 2017-08-06 ENCOUNTER — Encounter: Payer: Self-pay | Admitting: *Deleted

## 2017-08-06 ENCOUNTER — Ambulatory Visit
Admission: RE | Admit: 2017-08-06 | Discharge: 2017-08-06 | Disposition: A | Discharge status: Home / Self Care | Payer: BLUE CROSS/BLUE SHIELD | Source: Ambulatory Visit | Attending: Urology | Admitting: Urology

## 2017-08-06 ENCOUNTER — Encounter
Admission: RE | Disposition: A | Discharge status: Home / Self Care | Payer: Self-pay | Source: Ambulatory Visit | Attending: Urology

## 2017-08-06 DIAGNOSIS — N200 Calculus of kidney: Secondary | ICD-10-CM | POA: Diagnosis not present

## 2017-08-06 DIAGNOSIS — Z800 Family history of malignant neoplasm of digestive organs: Secondary | ICD-10-CM | POA: Insufficient documentation

## 2017-08-06 DIAGNOSIS — F17200 Nicotine dependence, unspecified, uncomplicated: Secondary | ICD-10-CM | POA: Insufficient documentation

## 2017-08-06 DIAGNOSIS — Z833 Family history of diabetes mellitus: Secondary | ICD-10-CM | POA: Insufficient documentation

## 2017-08-06 DIAGNOSIS — Z9884 Bariatric surgery status: Secondary | ICD-10-CM | POA: Insufficient documentation

## 2017-08-06 HISTORY — PX: EXTRACORPOREAL SHOCK WAVE LITHOTRIPSY: SHX1557

## 2017-08-06 LAB — URINE DRUG SCREEN, QUALITATIVE (ARMC ONLY)
Amphetamines, Ur Screen: NOT DETECTED
Barbiturates, Ur Screen: NOT DETECTED
Benzodiazepine, Ur Scrn: NOT DETECTED
Cannabinoid 50 Ng, Ur ~~LOC~~: NOT DETECTED
Cocaine Metabolite,Ur ~~LOC~~: NOT DETECTED
MDMA (Ecstasy)Ur Screen: NOT DETECTED
Methadone Scn, Ur: NOT DETECTED
Opiate, Ur Screen: NOT DETECTED
Phencyclidine (PCP) Ur S: NOT DETECTED
Tricyclic, Ur Screen: NOT DETECTED

## 2017-08-06 SURGERY — LITHOTRIPSY, ESWL
Anesthesia: General | Laterality: Left

## 2017-08-06 MED ORDER — CIPROFLOXACIN HCL 500 MG PO TABS: 500.0000 mg | ORAL_TABLET | Freq: Two times a day (BID) | ORAL | 0 refills | Status: DC

## 2017-08-06 MED ORDER — FAMOTIDINE 20 MG PO TABS
20.0000 mg | ORAL_TABLET | Freq: Once | ORAL | Status: AC
  Administered 2017-08-06: 20 mg via ORAL

## 2017-08-06 MED ORDER — LIDOCAINE HCL (PF) 2 % IJ SOLN
INTRAMUSCULAR | Status: AC
  Filled 2017-08-06: qty 2

## 2017-08-06 MED ORDER — ONDANSETRON 8 MG PO TBDP: 4.0000 mg | ORAL_TABLET | Freq: Four times a day (QID) | ORAL | 3 refills | Status: DC | PRN

## 2017-08-06 MED ORDER — PROPOFOL 10 MG/ML IV BOLUS
INTRAVENOUS | Status: AC
  Filled 2017-08-06: qty 20

## 2017-08-06 MED ORDER — LEVOFLOXACIN 500 MG PO TABS
ORAL_TABLET | ORAL | Status: AC
  Administered 2017-08-06: 500 mg via ORAL
  Filled 2017-08-06: qty 1

## 2017-08-06 MED ORDER — TAMSULOSIN HCL 0.4 MG PO CAPS: 0.4000 mg | ORAL_CAPSULE | Freq: Every day | ORAL | 11 refills | Status: DC

## 2017-08-06 MED ORDER — DEXAMETHASONE SODIUM PHOSPHATE 10 MG/ML IJ SOLN
INTRAMUSCULAR | Status: AC
  Filled 2017-08-06: qty 1

## 2017-08-06 MED ORDER — NUCYNTA 50 MG PO TABS
50.0000 mg | ORAL_TABLET | Freq: Four times a day (QID) | ORAL | 0 refills | Status: DC | PRN
Start: 2017-08-06 — End: 2017-11-05

## 2017-08-06 MED ORDER — EPHEDRINE SULFATE 50 MG/ML IJ SOLN
INTRAMUSCULAR | Status: AC
  Filled 2017-08-06: qty 1

## 2017-08-06 MED ORDER — FAMOTIDINE 20 MG PO TABS
ORAL_TABLET | ORAL | Status: AC
  Administered 2017-08-06: 20 mg via ORAL
  Filled 2017-08-06: qty 1

## 2017-08-06 MED ORDER — ONDANSETRON HCL 4 MG/2ML IJ SOLN: 4.0000 mg | Freq: Once | INTRAMUSCULAR | Status: DC | PRN

## 2017-08-06 MED ORDER — ONDANSETRON HCL 4 MG/2ML IJ SOLN
INTRAMUSCULAR | Status: AC
  Filled 2017-08-06: qty 2

## 2017-08-06 MED ORDER — DEXTROSE-NACL 5-0.45 % IV SOLN
INTRAVENOUS | Status: DC
  Administered 2017-08-06: 13:00:00 via INTRAVENOUS

## 2017-08-06 MED ORDER — FUROSEMIDE 10 MG/ML IJ SOLN: 10.0000 mg | Freq: Once | INTRAMUSCULAR | Status: DC

## 2017-08-06 MED ORDER — MIDAZOLAM HCL 2 MG/2ML IJ SOLN
INTRAMUSCULAR | Status: AC
  Filled 2017-08-06: qty 2

## 2017-08-06 MED ORDER — FENTANYL CITRATE (PF) 100 MCG/2ML IJ SOLN: 25.0000 ug | INTRAMUSCULAR | Status: DC | PRN

## 2017-08-06 MED ORDER — FENTANYL CITRATE (PF) 100 MCG/2ML IJ SOLN
INTRAMUSCULAR | Status: AC
  Filled 2017-08-06: qty 2

## 2017-08-06 MED ORDER — LEVOFLOXACIN 500 MG PO TABS
500.0000 mg | ORAL_TABLET | Freq: Once | ORAL | Status: AC
  Administered 2017-08-06: 500 mg via ORAL

## 2017-08-06 NOTE — Discharge Instructions (Addendum)
Kidney Stones Kidney stones (urolithiasis) are solid, rock-like deposits that form inside of the organs that make urine (kidneys). A kidney stone may form in a kidney and move into the bladder, where it can cause intense pain and block the flow of urine. Kidney stones are created when high levels of certain minerals are found in the urine. They are usually passed through urination, but in some cases, medical treatment may be needed to remove them. What are the causes? Kidney stones may be caused by:  A condition in which certain glands produce too much parathyroid hormone (primary hyperparathyroidism), which causes too much calcium buildup in the blood.  Buildup of uric acid crystals in the bladder (hyperuricosuria). Uric acid is a chemical that the body produces when you eat certain foods. It usually exits the body in the urine.  Narrowing (stricture) of one or both of the tubes that drain urine from the kidneys to the bladder (ureters).  A kidney blockage that is present at birth (congenital obstruction).  Past surgery on the kidney or the ureters, such as gastric bypass surgery.  What increases the risk? The following factors make you more likely to develop kidney stones:  Having had a kidney stone in the past.  Having a family history of kidney stones.  Not drinking enough water.  Eating a diet that is high in protein, salt (sodium), or sugar.  Being overweight or obese.  What are the signs or symptoms? Symptoms of a kidney stone may include:  Nausea.  Vomiting.  Blood in the urine (hematuria).  Pain in the side of the abdomen, right below the ribs (flank pain). Pain usually spreads (radiates) to the groin.  Needing to urinate frequently or urgently.  How is this diagnosed? This condition may be diagnosed based on:  Your medical history.  A physical exam.  Blood tests.  Urine tests.  CT scan.  Abdominal X-ray.  A procedure to examine the inside of the  bladder (cystoscopy).  How is this treated? Treatment for kidney stones depends on the size, location, and makeup of the stones. Treatment may involve:  Analyzing your urine before and after you pass the stone through urination.  Being monitored at the hospital until you pass the stone through urination.  Increasing your fluid intake and decreasing the amount of calcium and protein in your diet.  A procedure to break up kidney stones in the bladder using: ? A focused beam of light (laser therapy). ? Shock waves (extracorporeal shock wave lithotripsy).  Surgery to remove kidney stones. This may be needed if you have severe pain or have stones that block your urinary tract.  Follow these instructions at home: Eating and drinking   Drink enough fluid to keep your urine clear or pale yellow. This will help you to pass the kidney stone.  If directed, change your diet. This may include: ? Limiting how much sodium you eat. ? Eating more fruits and vegetables. ? Limiting how much meat, poultry, fish, and eggs you eat.  Follow instructions from your health care provider about eating or drinking restrictions. General instructions  Collect urine samples as told by your health care provider. You may need to collect a urine sample: ? 24 hours after you pass the stone. ? 8-12 weeks after passing the kidney stone, and every 6-12 months after that.  Strain your urine every time you urinate, for as long as directed. Use the strainer that your health care provider recommends.  Do not throw out  the kidney stone after passing it. Keep the stone so it can be tested by your health care provider. Testing the makeup of your kidney stone may help prevent you from getting kidney stones in the future. °· Take over-the-counter and prescription medicines only as told by your health care provider. °· Keep all follow-up visits as told by your health care provider. This is important. You may need follow-up  X-rays or ultrasounds to make sure that your stone has passed. °How is this prevented? °To prevent another kidney stone: °· Drink enough fluid to keep your urine clear or pale yellow. This is the best way to prevent kidney stones. °· Eat a healthy diet and follow recommendations from your health care provider about foods to avoid. You may be instructed to eat a low-protein diet. Recommendations vary depending on the type of kidney stone that you have. °· Maintain a healthy weight. ° °Contact a health care provider if: °· You have pain that gets worse or does not get better with medicine. °Get help right away if: °· You have a fever or chills. °· You develop severe pain. °· You develop new abdominal pain. °· You faint. °· You are unable to urinate. °This information is not intended to replace advice given to you by your health care provider. Make sure you discuss any questions you have with your health care provider. °Document Released: 11/03/2005 Document Revised: 05/23/2016 Document Reviewed: 04/18/2016 °Elsevier Interactive Patient Education © 2017 [employer] ° ° °Lithotripsy, Care After °This sheet gives you information about how to care for yourself after your procedure. Your health care provider may also give you more specific instructions. If you have problems or questions, contact your health care provider. °What can I expect after the procedure? °After the procedure, it is common to have: °· Some blood in your urine. This should only last for a few days. °· Soreness in your back, sides, or upper abdomen for a few days. °· Blotches or bruises on your back where the pressure wave entered the skin. °· Pain, discomfort, or nausea when pieces (fragments) of the kidney stone move through the tube that carries urine from the kidney to the bladder (ureter). Stone fragments may pass soon after the procedure, but they may continue to pass for up to 4-8 weeks. °? If you have severe pain or nausea, contact your  health care provider. This may be caused by a large stone that was not broken up, and this may mean that you need more treatment. °· Some pain or discomfort during urination. °· Some pain or discomfort in the lower abdomen or (in men) at the base of the penis. ° °Follow these instructions at home: °Medicines °· Take over-the-counter and prescription medicines only as told by your health care provider. °· If you were prescribed an antibiotic medicine, take it as told by your health care provider. Do not stop taking the antibiotic even if you start to feel better. °· Do not drive for 24 hours if you were given a medicine to help you relax (sedative). °· Do not drive or use heavy machinery while taking prescription pain medicine. °Eating and drinking °· Drink enough water and fluids to keep your urine clear or pale yellow. This helps any remaining pieces of the stone to pass. It can also help prevent new stones from forming. °· Eat plenty of fresh fruits and vegetables. °· Follow instructions from your health care provider about eating and drinking restrictions. You may be instructed: °?   To reduce how much salt (sodium) you eat or drink. Check ingredients and nutrition facts on packaged foods and beverages. ? To reduce how much meat you eat.  Eat the recommended amount of calcium for your age and gender. Ask your health care provider how much calcium you should have. General instructions  Get plenty of rest.  Most people can resume normal activities 1-2 days after the procedure. Ask your health care provider what activities are safe for you.  If directed, strain all urine through the strainer that was provided by your health care provider. ? Keep all fragments for your health care provider to see. Any stones that are found may be sent to a medical lab for examination. The stone may be as small as a grain of salt.  Keep all follow-up visits as told by your health care provider. This is important. Contact a  health care provider if:  You have pain that is severe or does not get better with medicine.  You have nausea that is severe or does not go away.  You have blood in your urine longer than your health care provider told you to expect.  You have more blood in your urine.  You have pain during urination that does not go away.  You urinate more frequently than usual and this does not go away.  You develop a rash or any other possible signs of an allergic reaction. Get help right away if:  You have severe pain in your back, sides, or upper abdomen.  You have severe pain while urinating.  Your urine is very dark red.  You have blood in your stool (feces).  You cannot pass any urine at all.  You feel a strong urge to urinate after emptying your bladder.  You have a fever or chills.  You develop shortness of breath, difficulty breathing, or chest pain.  You have severe nausea that leads to persistent vomiting.  You faint. Summary  After this procedure, it is common to have some pain, discomfort, or nausea when pieces (fragments) of the kidney stone move through the tube that carries urine from the kidney to the bladder (ureter). If this pain or nausea is severe, however, you should contact your health care provider.  Most people can resume normal activities 1-2 days after the procedure. Ask your health care provider what activities are safe for you.  Drink enough water and fluids to keep your urine clear or pale yellow. This helps any remaining pieces of the stone to pass, and it can help prevent new stones from forming.  If directed, strain your urine and keep all fragments for your health care provider to see. Fragments or stones may be as small as a grain of salt.  Get help right away if you have severe pain in your back, sides, or upper abdomen or have severe pain while urinating. This information is not intended to replace advice given to you by your health care provider.  Make sure you discuss any questions you have with your health care provider. Document Released: 11/23/2007 Document Revised: 09/24/2016 Document Reviewed: 09/24/2016 Elsevier Interactive Patient Education  2017 [place].   Lithotripsy Lithotripsy is a treatment that can sometimes help eliminate kidney stones and the pain that they cause. A form of lithotripsy, also known as extracorporeal shock wave lithotripsy, is a nonsurgical procedure that crushes a kidney stone with shock waves. These shock waves pass through your body and focus on the kidney stone. They cause the  kidney stone to break up while it is still in the urinary tract. This makes it easier for the smaller pieces of stone to pass in the urine. °Tell a health care provider about: °· Any allergies you have. °· All medicines you are taking, including vitamins, herbs, eye drops, creams, and over-the-counter medicines. °· Any blood disorders you have. °· Any surgeries you have had. °· Any medical conditions you have. °· Whether you are pregnant or may be pregnant. °· Any problems you or family members have had with anesthetic medicines. °What are the risks? °Generally, this is a safe procedure. However, problems may occur, including: °· Infection. °· Bleeding of the kidney. °· Bruising of the kidney or skin. °· Scarring of the kidney, which can lead to: °? Increased blood pressure. °? Poor kidney function. °? Return (recurrence) of kidney stones. °· Damage to other structures or organs, such as the liver, colon, spleen, or pancreas. °· Blockage (obstruction) of the the tube that carries urine from the kidney to the bladder (ureter). °· Failure of the kidney stone to break into pieces (fragments). ° °What happens before the procedure? °Staying hydrated °Follow instructions from your health care provider about hydration, which may include: °· Up to 2 hours before the procedure - you may continue to drink clear liquids, such as water, clear fruit  juice, black coffee, and plain tea. ° °Eating and drinking restrictions °Follow instructions from your health care provider about eating and drinking, which may include: °· 8 hours before the procedure - stop eating heavy meals or foods such as meat, fried foods, or fatty foods. °· 6 hours before the procedure - stop eating light meals or foods, such as toast or cereal. °· 6 hours before the procedure - stop drinking milk or drinks that contain milk. °· 2 hours before the procedure - stop drinking clear liquids. ° °General instructions °· Plan to have someone take you home from the hospital or clinic. °· Ask your health care provider about: °? Changing or stopping your regular medicines. This is especially important if you are taking diabetes medicines or blood thinners. °? Taking medicines such as aspirin and ibuprofen. These medicines and other NSAIDs can thin your blood. Do not take these medicines for 7 days before your procedure if your health care provider instructs you not to. °· You may have tests, such as: °? Blood tests. °? Urine tests. °? Imaging tests, such as a CT scan. °What happens during the procedure? °· To lower your risk of infection: °? Your health care team will wash or sanitize their hands. °? Your skin will be washed with soap. °· An IV tube will be inserted into one of your veins. This tube will give you fluids and medicines. °· You will be given one or more of the following: °? A medicine to help you relax (sedative). °? A medicine to make you fall asleep (general anesthetic). °· A water-filled cushion may be placed behind your kidney or on your abdomen. In some cases you may be placed in a tub of lukewarm water. °· Your body will be positioned in a way that makes it easy to target the kidney stone. °· A flexible tube with holes in it (stent) may be placed in the ureter. This will help keep urine flowing from the kidney if the fragments of the stone have been blocking the ureter. °· An X-ray  or ultrasound exam will be done to locate your stone. °· Shock waves will be   aimed at the stone. If you are awake, you may feel a tapping sensation as the shock waves pass through your body. The procedure may vary among health care providers and hospitals. What happens after the procedure?  You may have an X-ray to see whether the procedure was able to break up the kidney stone and how much of the stone has passed. If large stone fragments remain after treatment, you may need to have a second procedure at a later time.  Your blood pressure, heart rate, breathing rate, and blood oxygen level will be monitored until the medicines you were given have worn off.  You may be given antibiotics or pain medicine as needed.  If a stent was placed in your ureter during surgery, it may stay in place for a few weeks.  You may need strain your urine to collect pieces of the kidney stone for testing.  You will need to drink plenty of water.  Do not drive for 24 hours if you were given a sedative. Summary  Lithotripsy is a treatment that can sometimes help eliminate kidney stones and the pain that they cause.  A form of lithotripsy, also known as extracorporeal shock wave lithotripsy, is a nonsurgical procedure that crushes a kidney stone with shock waves.  Generally, this is a safe procedure. However, problems may occur, including damage to the kidney or other organs, infection, or obstruction of the tube that carries urine from the kidney to the bladder (ureter).  When you go home, you will need to drink plenty of water. You may be asked to strain your urine to collect pieces of the kidney stone for testing. This information is not intended to replace advice given to you by your health care provider. Make sure you discuss any questions you have with your health care provider. Document Released: 10/31/2000 Document Revised: 09/24/2016 Document Reviewed: 09/24/2016 Elsevier Interactive Patient Education   2017 [employer] Inc[employer]    AMBULATORY SURGERY  DISCHARGE INSTRUCTIONS   1) The drugs that you were given will stay in your system until tomorrow so for the next 24 hours you should not:  A) Drive an automobile B) Make any legal decisions C) Drink any alcoholic beverage   2) You may resume regular meals tomorrow.  Today it is better to start with liquids and gradually work up to solid foods.  You may eat anything you prefer, but it is better to start with liquids, then soup and crackers, and gradually work up to solid foods.   3) Please notify your doctor immediately if you have any unusual bleeding, trouble breathing, redness and pain at the surgery site, drainage, fever, or pain not relieved by medication.    4) Additional Instructions:DRINK DRINK DRINK at least one bottle per hour of water(preferrably) but any liquid will do.  Strain all urine and keep any stone fragments.  Bring back with you to the follow up appointment.  You may take pain medication as needed or take tylenol/ibuprofen for pain.      Please contact your physician with any problems or Same Day Surgery at [phone], Monday through Friday 6 am to 4 pm, or [place] at [facility] number at [phone].

## 2017-08-06 NOTE — Anesthesia Preprocedure Evaluation (Signed)
Anesthesia Evaluation  Patient identified by MRN, date of birth, ID band Patient awake    Reviewed: Allergy & Precautions, H&P , NPO status , Patient's Chart, lab work & pertinent test results, reviewed documented beta blocker date and time   History of Anesthesia Complications (+) history of anesthetic complications  Airway Mallampati: II  TM Distance: >3 FB Neck ROM: full    Dental  (+) Teeth Intact   Pulmonary neg pulmonary ROS, Current Smoker,    Pulmonary exam normal        Cardiovascular Exercise Tolerance: Good negative cardio ROS Normal cardiovascular exam Rate:Normal     Neuro/Psych negative neurological ROS  negative psych ROS   GI/Hepatic negative GI ROS, Neg liver ROS,   Endo/Other  negative endocrine ROS  Renal/GU negative Renal ROS  negative genitourinary   Musculoskeletal   Abdominal   Peds  Hematology negative hematology ROS (+)   Anesthesia Other Findings   Reproductive/Obstetrics negative OB ROS                             Anesthesia Physical Anesthesia Plan  ASA: II  Anesthesia Plan: General LMA   Post-op Pain Management:    Induction:   PONV Risk Score and Plan: 2 and Ondansetron and Dexamethasone  Airway Management Planned:   Additional Equipment:   Intra-op Plan:   Post-operative Plan:   Informed Consent: I have reviewed the patients History and Physical, chart, labs and discussed the procedure including the risks, benefits and alternatives for the proposed anesthesia with the patient or authorized representative who has indicated his/her understanding and acceptance.     Plan Discussed with: CRNA  Anesthesia Plan Comments:         Anesthesia Quick Evaluation

## 2017-08-06 NOTE — Transfer of Care (Signed)
Immediate Anesthesia Transfer of Care Note  Patient: Thomas Huerta  Procedure(s) Performed: Procedure(s): EXTRACORPOREAL SHOCK WAVE LITHOTRIPSY (ESWL) (Left)  Patient Location: PACU  Anesthesia Type:General  Level of Consciousness: sedated  Airway & Oxygen Therapy: Patient Spontanous Breathing and Patient connected to face mask oxygen  Post-op Assessment: Report given to RN and Post -op Vital signs reviewed and stable  Post vital signs: Reviewed and stable  Last Vitals:  Vitals:   08/06/17 1220 08/06/17 1552  BP: (!) 129/93 139/88  Pulse: 60 (!) 51  Resp: 16 10  Temp: (!) 36.1 C (!) 36.2 C  SpO2: 99% 98%    Last Pain:  Vitals:   08/06/17 1220  TempSrc: Tympanic  PainSc: 1          Complications: No apparent anesthesia complications

## 2017-08-06 NOTE — Anesthesia Post-op Follow-up Note (Signed)
Anesthesia QCDR form completed.        

## 2017-08-07 ENCOUNTER — Encounter: Payer: Self-pay | Admitting: Urology

## 2017-08-07 NOTE — Anesthesia Postprocedure Evaluation (Signed)
Anesthesia Post Note  Patient: Thomas Huerta  Procedure(s) Performed: Procedure(s) (LRB): EXTRACORPOREAL SHOCK WAVE LITHOTRIPSY (ESWL) (Left)  Patient location during evaluation: PACU Anesthesia Type: General Level of consciousness: awake and alert Pain management: pain level controlled Vital Signs Assessment: post-procedure vital signs reviewed and stable Respiratory status: spontaneous breathing, nonlabored ventilation, respiratory function stable and patient connected to nasal cannula oxygen Cardiovascular status: blood pressure returned to baseline and stable Postop Assessment: no apparent nausea or vomiting Anesthetic complications: no     Last Vitals:  Vitals:   08/06/17 1635 08/06/17 1714  BP: 133/87 125/82  Pulse: (!) 51 (!) 52  Resp: 15 16  Temp: (!) 35.8 C   SpO2: 100% 100%    Last Pain:  Vitals:   08/07/17 0830  TempSrc:   PainSc: 0-No pain                 Yevette Edwards

## 2017-08-13 NOTE — H&P (Signed)
Thomas Huerta, Thomas Huerta           ACCOUNT NO.:  [id number]  MEDICAL RECORD NO.:  [id number]  LOCATION:  [facility]                         FACILITY:  [facility]  PHYSICIAN:  Anola Gurney          DATE OF BIRTH:  Apr 19, 1956  DATE OF ADMISSION:  08/06/2017 DATE OF DISCHARGE:  08/06/2017                            HISTORY AND PHYSICAL   SAME-DAY SURGERY:  August 18, 2017.  CHIEF COMPLAINT:  Ureteral stent.  HISTORY OF PRESENT ILLNESS:  [name] is a 61 year old white male with history of left ureterolithiasis and bilateral nephrolithiasis, who is status post left ureteroscopic ureterolithotomy with holmium laser lithotripsy, on June 16, 2017.  He had a stent placed at that time.  He had several large left renal stones and underwent lithotripsy.  Stones are fully fragmented into powder and he comes in now for cystoscopy with stent removal.  ALLERGIES:  NO DRUG ALLERGIES.  MEDICATIONS:  Suboxone and aspirin.  SURGICAL HISTORY: 1. Gastric bypass in 2004. 2. Right inguinal herniorrhaphy in 2014. 3. Ureteroscopic stone removals in 2005, 2008, and 2016.  PAST AND CURRENT MEDICAL CONDITIONS: 1. Morbid obesity. 2. Chronic back pain.  SOCIAL HISTORY:  The patient smokes a pack a day, has a 10-pack year history.  He denies alcohol use.  REVIEW OF SYSTEMS:  The patient denies chest pain, shortness of breath, diabetes, or heart disease.  FAMILY HISTORY:  Positive for colon cancer and diabetes.  PHYSICAL EXAMINATION:  GENERAL:  Well-nourished white male, in no acute distress. HEENT:  Sclerae were clear. NECK:  Supple.  No palpable cervical adenopathy. LUNGS:  Clear to auscultation. CARDIOVASCULAR:  Regular rhythm and rate without audible murmurs. ABDOMEN:  Soft, nontender abdomen. NEUROMUSCULAR:  Alert and oriented x3. GU:  Deferred.  IMPRESSION:  Left ureterolithiasis and nephrolithiasis, status post ureteroscopic ureterolithotomy and extracorporeal shockwave lithotripsy.  PLAN:   Cystoscopy with stent retrieval.          ______________________________ Anola Gurney     MW/MEDQ  D:  08/13/2017  T:  08/13/2017  Job:  [id number]

## 2017-08-18 ENCOUNTER — Ambulatory Visit: Payer: BLUE CROSS/BLUE SHIELD | Admitting: Anesthesiology

## 2017-08-18 ENCOUNTER — Ambulatory Visit
Admission: RE | Admit: 2017-08-18 | Discharge: 2017-08-18 | Disposition: A | Discharge status: Home / Self Care | Payer: BLUE CROSS/BLUE SHIELD | Source: Ambulatory Visit | Attending: Urology | Admitting: Urology

## 2017-08-18 ENCOUNTER — Encounter: Payer: Self-pay | Admitting: Emergency Medicine

## 2017-08-18 ENCOUNTER — Encounter
Admission: RE | Disposition: A | Discharge status: Home / Self Care | Payer: Self-pay | Source: Ambulatory Visit | Attending: Urology

## 2017-08-18 DIAGNOSIS — Z9884 Bariatric surgery status: Secondary | ICD-10-CM | POA: Diagnosis not present

## 2017-08-18 DIAGNOSIS — N200 Calculus of kidney: Secondary | ICD-10-CM | POA: Insufficient documentation

## 2017-08-18 DIAGNOSIS — Z79899 Other long term (current) drug therapy: Secondary | ICD-10-CM | POA: Diagnosis not present

## 2017-08-18 DIAGNOSIS — Z466 Encounter for fitting and adjustment of urinary device: Secondary | ICD-10-CM | POA: Insufficient documentation

## 2017-08-18 HISTORY — PX: CYSTOSCOPY W/ URETERAL STENT REMOVAL: SHX1430

## 2017-08-18 LAB — URINE DRUG SCREEN, QUALITATIVE (ARMC ONLY)
Amphetamines, Ur Screen: NOT DETECTED
Barbiturates, Ur Screen: NOT DETECTED
Benzodiazepine, Ur Scrn: POSITIVE — AB
Cannabinoid 50 Ng, Ur ~~LOC~~: NOT DETECTED
Cocaine Metabolite,Ur ~~LOC~~: NOT DETECTED
MDMA (Ecstasy)Ur Screen: NOT DETECTED
Methadone Scn, Ur: NOT DETECTED
Opiate, Ur Screen: NOT DETECTED
Phencyclidine (PCP) Ur S: NOT DETECTED
Tricyclic, Ur Screen: NOT DETECTED

## 2017-08-18 SURGERY — REMOVAL, STENT, URETER, CYSTOSCOPIC
Anesthesia: General | Site: Ureter | Laterality: Left | Wound class: Clean Contaminated

## 2017-08-18 MED ORDER — FENTANYL CITRATE (PF) 100 MCG/2ML IJ SOLN
INTRAMUSCULAR | Status: DC | PRN
  Administered 2017-08-18: 25 ug via INTRAVENOUS

## 2017-08-18 MED ORDER — FUROSEMIDE 10 MG/ML IJ SOLN
INTRAMUSCULAR | Status: AC
  Filled 2017-08-18: qty 4

## 2017-08-18 MED ORDER — FENTANYL CITRATE (PF) 100 MCG/2ML IJ SOLN
INTRAMUSCULAR | Status: AC
  Filled 2017-08-18: qty 2

## 2017-08-18 MED ORDER — LEVOFLOXACIN IN D5W 500 MG/100ML IV SOLN
INTRAVENOUS | Status: AC
  Filled 2017-08-18: qty 100

## 2017-08-18 MED ORDER — OXYCODONE HCL 5 MG/5ML PO SOLN: 5.0000 mg | Freq: Once | ORAL | Status: DC | PRN

## 2017-08-18 MED ORDER — LIDOCAINE HCL 2 % EX GEL
CUTANEOUS | Status: DC | PRN
  Administered 2017-08-18: 1 via URETHRAL

## 2017-08-18 MED ORDER — PROPOFOL 10 MG/ML IV BOLUS
INTRAVENOUS | Status: AC
  Filled 2017-08-18: qty 20

## 2017-08-18 MED ORDER — PROMETHAZINE HCL 25 MG/ML IJ SOLN: 6.2500 mg | INTRAMUSCULAR | Status: DC | PRN

## 2017-08-18 MED ORDER — MIDAZOLAM HCL 2 MG/2ML IJ SOLN
INTRAMUSCULAR | Status: DC | PRN
  Administered 2017-08-18: 2 mg via INTRAVENOUS

## 2017-08-18 MED ORDER — LIDOCAINE HCL 2 % EX GEL
CUTANEOUS | Status: AC
  Filled 2017-08-18: qty 10

## 2017-08-18 MED ORDER — OXYCODONE HCL 5 MG PO TABS: 5.0000 mg | ORAL_TABLET | Freq: Once | ORAL | Status: DC | PRN

## 2017-08-18 MED ORDER — PROPOFOL 10 MG/ML IV BOLUS
INTRAVENOUS | Status: DC | PRN
  Administered 2017-08-18: 150 mg via INTRAVENOUS

## 2017-08-18 MED ORDER — ONDANSETRON HCL 4 MG/2ML IJ SOLN
INTRAMUSCULAR | Status: AC
  Filled 2017-08-18: qty 2

## 2017-08-18 MED ORDER — ONDANSETRON HCL 4 MG/2ML IJ SOLN
INTRAMUSCULAR | Status: DC | PRN
  Administered 2017-08-18: 4 mg via INTRAVENOUS

## 2017-08-18 MED ORDER — DEXAMETHASONE SODIUM PHOSPHATE 10 MG/ML IJ SOLN
INTRAMUSCULAR | Status: DC | PRN
  Administered 2017-08-18: 10 mg via INTRAVENOUS

## 2017-08-18 MED ORDER — FENTANYL CITRATE (PF) 100 MCG/2ML IJ SOLN: 25.0000 ug | INTRAMUSCULAR | Status: DC | PRN

## 2017-08-18 MED ORDER — MIDAZOLAM HCL 2 MG/2ML IJ SOLN
INTRAMUSCULAR | Status: AC
  Filled 2017-08-18: qty 2

## 2017-08-18 MED ORDER — LACTATED RINGERS IV SOLN
INTRAVENOUS | Status: DC
  Administered 2017-08-18 (×2): via INTRAVENOUS

## 2017-08-18 MED ORDER — GLYCOPYRROLATE 0.2 MG/ML IJ SOLN
INTRAMUSCULAR | Status: DC | PRN
  Administered 2017-08-18: 0.2 mg via INTRAVENOUS

## 2017-08-18 MED ORDER — LIDOCAINE HCL (PF) 2 % IJ SOLN
INTRAMUSCULAR | Status: AC
  Filled 2017-08-18: qty 4

## 2017-08-18 MED ORDER — GLYCOPYRROLATE 0.2 MG/ML IJ SOLN
INTRAMUSCULAR | Status: AC
  Filled 2017-08-18: qty 1

## 2017-08-18 MED ORDER — FAMOTIDINE 20 MG PO TABS
ORAL_TABLET | ORAL | Status: AC
  Administered 2017-08-18: 20 mg via ORAL
  Filled 2017-08-18: qty 1

## 2017-08-18 MED ORDER — FAMOTIDINE 20 MG PO TABS
20.0000 mg | ORAL_TABLET | Freq: Once | ORAL | Status: AC
  Administered 2017-08-18: 20 mg via ORAL

## 2017-08-18 MED ORDER — MEPERIDINE HCL 50 MG/ML IJ SOLN: 6.2500 mg | INTRAMUSCULAR | Status: DC | PRN

## 2017-08-18 MED ORDER — LIDOCAINE HCL (CARDIAC) 20 MG/ML IV SOLN
INTRAVENOUS | Status: DC | PRN
  Administered 2017-08-18: 80 mg via INTRAVENOUS

## 2017-08-18 MED ORDER — FUROSEMIDE 10 MG/ML IJ SOLN
INTRAMUSCULAR | Status: DC | PRN
  Administered 2017-08-18: 10 mg via INTRAMUSCULAR

## 2017-08-18 MED ORDER — LEVOFLOXACIN IN D5W 500 MG/100ML IV SOLN
500.0000 mg | Freq: Once | INTRAVENOUS | Status: AC
  Administered 2017-08-18: 500 mg via INTRAVENOUS

## 2017-08-18 SURGICAL SUPPLY — 17 items
BAG DRAIN CYSTO-URO LG1000N (MISCELLANEOUS) ×2 IMPLANT
GLOVE BIO SURGEON STRL SZ7 (GLOVE) ×4 IMPLANT
GLOVE BIO SURGEON STRL SZ7.5 (GLOVE) ×2 IMPLANT
GOWN STRL REUS W/ TWL LRG LVL3 (GOWN DISPOSABLE) ×1 IMPLANT
GOWN STRL REUS W/ TWL XL LVL3 (GOWN DISPOSABLE) ×1 IMPLANT
GOWN STRL REUS W/TWL LRG LVL3 (GOWN DISPOSABLE) ×1
GOWN STRL REUS W/TWL XL LVL3 (GOWN DISPOSABLE) ×1
GUIDEWIRE STR ZIPWIRE 035X150 (MISCELLANEOUS) ×2 IMPLANT
PACK CYSTO AR (MISCELLANEOUS) ×2 IMPLANT
PREP PVP WINGED SPONGE (MISCELLANEOUS) ×2 IMPLANT
SET CYSTO W/LG BORE CLAMP LF (SET/KITS/TRAYS/PACK) ×2 IMPLANT
SOL .9 NS 3000ML IRR  AL (IV SOLUTION) ×1
SOL .9 NS 3000ML IRR UROMATIC (IV SOLUTION) ×1 IMPLANT
SOL PREP PVP 2OZ (MISCELLANEOUS) ×2
SOLUTION PREP PVP 2OZ (MISCELLANEOUS) ×1 IMPLANT
SURGILUBE 2OZ TUBE FLIPTOP (MISCELLANEOUS) ×2 IMPLANT
WATER STERILE IRR 1000ML POUR (IV SOLUTION) ×2 IMPLANT

## 2017-08-18 NOTE — Op Note (Signed)
Preoperative diagnosis: Left nephrolithiasis  Postoperative diagnosis: Same   Procedure: Cystoscopy with removal of left ureteral stent    Surgeon: Suszanne Conners. Evelene Croon MD  Anesthesia: General  Indications:See the history and physical. After informed consent the above procedure(s) were requested     Technique and findings: After adequate general anesthesia been obtained the patient was placed into dorsal lithotomy position and the perineum was prepped and draped in the usual fashion. The 21 French cystoscope was coupled to the camera and visually advanced into the bladder. The stent was identified exiting the left ureteral orifice. No bladder lesions were identified. The stent was engaged with the alligator forceps and removed. 10 cc of viscous Xylocaine was instilled within the urethra and bladder. The procedure was then terminated and the patient was transferred to the recovery room in stable condition.

## 2017-08-18 NOTE — Transfer of Care (Signed)
Immediate Anesthesia Transfer of Care Note  Patient: Thomas Huerta  Procedure(s) Performed: CYSTOSCOPY WITH STENT REMOVAL (Left Ureter)  Patient Location: PACU  Anesthesia Type:General  Level of Consciousness: drowsy and patient cooperative  Airway & Oxygen Therapy: Patient Spontanous Breathing and Patient connected to face mask oxygen  Post-op Assessment: Report given to RN and Post -op Vital signs reviewed and stable  Post vital signs: Reviewed and stable  Last Vitals:  Vitals:   08/18/17 1120  BP: 126/85  Pulse: (!) 50  Resp: 18  Temp: 36.6 C  SpO2: 100%    Last Pain:  Vitals:   08/18/17 1120  TempSrc: Oral         Complications: No apparent anesthesia complications

## 2017-08-18 NOTE — Anesthesia Preprocedure Evaluation (Signed)
Anesthesia Evaluation  Patient identified by MRN, date of birth, ID band Patient awake    Reviewed: Allergy & Precautions, NPO status , Patient's Chart, lab work & pertinent test results  History of Anesthesia Complications Negative for: history of anesthetic complications  Airway Mallampati: II  TM Distance: >3 FB Neck ROM: Full    Dental no notable dental hx.    Pulmonary neg sleep apnea, neg COPD, Current Smoker,    breath sounds clear to auscultation- rhonchi (-) wheezing      Cardiovascular Exercise Tolerance: Good (-) hypertension(-) CAD, (-) Past MI and (-) Cardiac Stents  Rhythm:Regular Rate:Normal - Systolic murmurs and - Diastolic murmurs    Neuro/Psych negative neurological ROS  negative psych ROS   GI/Hepatic negative GI ROS, Neg liver ROS,   Endo/Other  negative endocrine ROSneg diabetes  Renal/GU Renal disease: nephrolithiasis.     Musculoskeletal negative musculoskeletal ROS (+)   Abdominal (+) - obese,   Peds  Hematology negative hematology ROS (+)   Anesthesia Other Findings Past Medical History: No date: Back pain     Comment:  chronic No date: Complication of anesthesia     Comment:  over medicated with Fentanyl No date: History of kidney stones     Comment:  5th time treating stones in 15 years No date: Obesity     Comment:  history of prior to gastric bypass   Reproductive/Obstetrics                             Anesthesia Physical Anesthesia Plan  ASA: II  Anesthesia Plan: General   Post-op Pain Management:    Induction: Intravenous  PONV Risk Score and Plan: 0 and Ondansetron  Airway Management Planned: LMA  Additional Equipment:   Intra-op Plan:   Post-operative Plan:   Informed Consent: I have reviewed the patients History and Physical, chart, labs and discussed the procedure including the risks, benefits and alternatives for the proposed  anesthesia with the patient or authorized representative who has indicated his/her understanding and acceptance.   Dental advisory given  Plan Discussed with: CRNA and Anesthesiologist  Anesthesia Plan Comments:         Anesthesia Quick Evaluation

## 2017-08-18 NOTE — Anesthesia Postprocedure Evaluation (Signed)
Anesthesia Post Note  Patient: Thomas Huerta  Procedure(s) Performed: CYSTOSCOPY WITH STENT REMOVAL (Left Ureter)  Patient location during evaluation: PACU Anesthesia Type: General Level of consciousness: awake and alert and oriented Pain management: pain level controlled Vital Signs Assessment: post-procedure vital signs reviewed and stable Respiratory status: spontaneous breathing, nonlabored ventilation and respiratory function stable Cardiovascular status: blood pressure returned to baseline and stable Postop Assessment: no signs of nausea or vomiting Anesthetic complications: no     Last Vitals:  Vitals:   08/18/17 1335 08/18/17 1350  BP: 107/79 122/86  Pulse: (!) 49 (!) 49  Resp: (!) 9 12  Temp: 36.9 C   SpO2: 100% 100%    Last Pain:  Vitals:   08/18/17 1335  TempSrc:   PainSc: Asleep                 Dawanna Grauberger

## 2017-08-18 NOTE — Anesthesia Post-op Follow-up Note (Signed)
Anesthesia QCDR form completed.        

## 2017-08-18 NOTE — Discharge Instructions (Addendum)
AMBULATORY SURGERY  DISCHARGE INSTRUCTIONS   1) The drugs that you were given will stay in your system until tomorrow so for the next 24 hours you should not:  A) Drive an automobile B) Make any legal decisions C) Drink any alcoholic beverage   2) You may resume regular meals tomorrow.  Today it is better to start with liquids and gradually work up to solid foods.  You may eat anything you prefer, but it is better to start with liquids, then soup and crackers, and gradually work up to solid foods.   3) Please notify your doctor immediately if you have any unusual bleeding, trouble breathing, redness and pain at the surgery site, drainage, fever, or pain not relieved by medication.    4) Additional Instructions:        Please contact your physician with any problems or Same Day Surgery at [phone], Monday through Friday 6 am to 4 pm, or [place] [facility] number at [phone].Kidney Stones Kidney stones (urolithiasis) are solid, rock-like deposits that form inside of the organs that make urine (kidneys). A kidney stone may form in a kidney and move into the bladder, where it can cause intense pain and block the flow of urine. Kidney stones are created when high levels of certain minerals are found in the urine. They are usually passed through urination, but in some cases, medical treatment may be needed to remove them. What are the causes? Kidney stones may be caused by:  A condition in which certain glands produce too much parathyroid hormone (primary hyperparathyroidism), which causes too much calcium buildup in the blood.  Buildup of uric acid crystals in the bladder (hyperuricosuria). Uric acid is a chemical that the body produces when you eat certain foods. It usually exits the body in the urine.  Narrowing (stricture) of one or both of the tubes that drain urine from the kidneys to the bladder (ureters).  A kidney blockage that is present at birth  (congenital obstruction).  Past surgery on the kidney or the ureters, such as gastric bypass surgery.  What increases the risk? The following factors make you more likely to develop kidney stones:  Having had a kidney stone in the past.  Having a family history of kidney stones.  Not drinking enough water.  Eating a diet that is high in protein, salt (sodium), or sugar.  Being overweight or obese.  What are the signs or symptoms? Symptoms of a kidney stone may include:  Nausea.  Vomiting.  Blood in the urine (hematuria).  Pain in the side of the abdomen, right below the ribs (flank pain). Pain usually spreads (radiates) to the groin.  Needing to urinate frequently or urgently.  How is this diagnosed? This condition may be diagnosed based on:  Your medical history.  A physical exam.  Blood tests.  Urine tests.  CT scan.  Abdominal X-ray.  A procedure to examine the inside of the bladder (cystoscopy).  How is this treated? Treatment for kidney stones depends on the size, location, and makeup of the stones. Treatment may involve:  Analyzing your urine before and after you pass the stone through urination.  Being monitored at the hospital until you pass the stone through urination.  Increasing your fluid intake and decreasing the amount of calcium and protein in your diet.  A procedure to break up kidney stones in the bladder using: ? A focused beam of light (laser therapy). ? Shock waves (extracorporeal shock wave lithotripsy).  Surgery to remove kidney stones. This may be needed if you have severe pain or have stones that block your urinary tract.  Follow these instructions at home: Eating and drinking   Drink enough fluid to keep your urine clear or pale yellow. This will help you to pass the kidney stone.  If directed, change your diet. This may include: ? Limiting how much sodium you eat. ? Eating more fruits and vegetables. ? Limiting how much  meat, poultry, fish, and eggs you eat.  Follow instructions from your health care provider about eating or drinking restrictions. General instructions  Collect urine samples as told by your health care provider. You may need to collect a urine sample: ? 24 hours after you pass the stone. ? 8-12 weeks after passing the kidney stone, and every 6-12 months after that.  Strain your urine every time you urinate, for as long as directed. Use the strainer that your health care provider recommends.  Do not throw out the kidney stone after passing it. Keep the stone so it can be tested by your health care provider. Testing the makeup of your kidney stone may help prevent you from getting kidney stones in the future.  Take over-the-counter and prescription medicines only as told by your health care provider.  Keep all follow-up visits as told by your health care provider. This is important. You may need follow-up X-rays or ultrasounds to make sure that your stone has passed. How is this prevented? To prevent another kidney stone:  Drink enough fluid to keep your urine clear or pale yellow. This is the best way to prevent kidney stones.  Eat a healthy diet and follow recommendations from your health care provider about foods to avoid. You may be instructed to eat a low-protein diet. Recommendations vary depending on the type of kidney stone that you have.  Maintain a healthy weight.  Contact a health care provider if:  You have pain that gets worse or does not get better with medicine. Get help right away if:  You have a fever or chills.  You develop severe pain.  You develop new abdominal pain.  You faint.  You are unable to urinate. This information is not intended to replace advice given to you by your health care provider. Make sure you discuss any questions you have with your health care provider. Document Released: 11/03/2005 Document Revised: 05/23/2016 Document Reviewed:  04/18/2016 Elsevier Interactive Patient Education  2017 [employer]  Cystoscopy, Care After Refer to this sheet in the next few weeks. These instructions provide you with information about caring for yourself after your procedure. Your health care provider may also give you more specific instructions. Your treatment has been planned according to current medical practices, but problems sometimes occur. Call your health care provider if you have any problems or questions after your procedure. What can I expect after the procedure? After the procedure, it is common to have:  Mild pain when you urinate. Pain should stop within a few minutes after you urinate. This may last for up to 1 week.  A small amount of blood in your urine for several days.  Feeling like you need to urinate but producing only a small amount of urine.  Follow these instructions at home:  Medicines  Take over-the-counter and prescription medicines only as told by your health care provider.  If you were prescribed an antibiotic medicine, take it as told by your health care provider. Do not stop taking the  antibiotic even if you start to feel better. General instructions   Return to your normal activities as told by your health care provider. Ask your health care provider what activities are safe for you.  Do not drive for 24 hours if you received a sedative.  Watch for any blood in your urine. If the amount of blood in your urine increases, call your health care provider.  Follow instructions from your health care provider about eating or drinking restrictions.  If a tissue sample was removed for testing (biopsy) during your procedure, it is your responsibility to get your test results. Ask your health care provider or the department performing the test when your results will be ready.  Drink enough fluid to keep your urine clear or pale yellow.  Keep all follow-up visits as told by your health care provider.  This is important. Contact a health care provider if:  You have pain that gets worse or does not get better with medicine, especially pain when you urinate.  You have difficulty urinating. Get help right away if:  You have more blood in your urine.  You have blood clots in your urine.  You have abdominal pain.  You have a fever or chills.  You are unable to urinate. This information is not intended to replace advice given to you by your health care provider. Make sure you discuss any questions you have with your health care provider. Document Released: 05/23/2005 Document Revised: 04/10/2016 Document Reviewed: 09/20/2015 Elsevier Interactive Patient Education  2017 [employer].  Cystoscopy Cystoscopy is a procedure that is used to help diagnose and sometimes treat conditions that affect that lower urinary tract. The lower urinary tract includes the bladder and the tube that drains urine from the bladder out of the body (urethra). Cystoscopy is performed with a thin, tube-shaped instrument with a light and camera at the end (cystoscope). The cystoscope may be hard (rigid) or flexible, depending on the goal of the procedure.The cystoscope is inserted through the urethra, into the bladder. Cystoscopy may be recommended if you have:  Urinary tractinfections that keep coming back (recurring).  Blood in the urine (hematuria).  Loss of bladder control (urinary incontinence) or an overactive bladder.  Unusual cells found in a urine sample.  A blockage in the urethra.  Painful urination.  An abnormality in the bladder found during an intravenous pyelogram (IVP) or CT scan.  Cystoscopy may also be done to remove a sample of tissue to be examined under a microscope (biopsy). Tell a health care provider about:  Any allergies you have.  All medicines you are taking, including vitamins, herbs, eye drops, creams, and over-the-counter medicines.  Any problems you or family members  have had with anesthetic medicines.  Any blood disorders you have.  Any surgeries you have had.  Any medical conditions you have.  Whether you are pregnant or may be pregnant. What are the risks? Generally, this is a safe procedure. However, problems may occur, including:  Infection.  Bleeding.  Allergic reactions to medicines.  Damage to other structures or organs.  What happens before the procedure?  Ask your health care provider about: ? Changing or stopping your regular medicines. This is especially important if you are taking diabetes medicines or blood thinners. ? Taking medicines such as aspirin and ibuprofen. These medicines can thin your blood. Do not take these medicines before your procedure if your health care provider instructs you not to.  Follow instructions from your health care provider  about eating or drinking restrictions.  You may be given antibiotic medicine to help prevent infection.  You may have an exam or testing, such as X-rays of the bladder, urethra, or kidneys.  You may have urine tests to check for signs of infection.  Plan to have someone take you home after the procedure. What happens during the procedure?  To reduce your risk of infection,your health care team will wash or sanitize their hands.  You will be given one or more of the following: ? A medicine to help you relax (sedative). ? A medicine to numb the area (local anesthetic).  The area around the opening of your urethra will be cleaned.  The cystoscope will be passed through your urethra into your bladder.  Germ-free (sterile)fluid will flow through the cystoscope to fill your bladder. The fluid will stretch your bladder so that your surgeon can clearly examine your bladder walls.  The cystoscope will be removed and your bladder will be emptied. The procedure may vary among health care providers and hospitals. What happens after the procedure?  You may have some soreness  or pain in your abdomen and urethra. Medicines will be available to help you.  You may have some blood in your urine.  Do not drive for 24 hours if you received a sedative. This information is not intended to replace advice given to you by your health care provider. Make sure you discuss any questions you have with your health care provider. Document Released: 10/31/2000 Document Revised: 03/13/2016 Document Reviewed: 09/20/2015 Elsevier Interactive Patient Education  2017 [employer].

## 2017-08-18 NOTE — H&P (Signed)
Date of Initial H&P: 08/06/17  History reviewed, patient examined, no change in status, stable for surgery.

## 2017-08-18 NOTE — Anesthesia Procedure Notes (Signed)
Procedure Name: LMA Insertion Date/Time: 08/18/2017 1:01 PM Performed by: Michaele Offer Pre-anesthesia Checklist: Patient identified, Emergency Drugs available, Suction available, Patient being monitored and Timeout performed Patient Re-evaluated:Patient Re-evaluated prior to induction Oxygen Delivery Method: Circle system utilized Preoxygenation: Pre-oxygenation with 100% oxygen Induction Type: IV induction Ventilation: Mask ventilation without difficulty LMA: LMA inserted LMA Size: 4.0 Number of attempts: 1 Placement Confirmation: positive ETCO2 and breath sounds checked- equal and bilateral Tube secured with: Tape Dental Injury: Teeth and Oropharynx as per pre-operative assessment

## 2017-08-19 ENCOUNTER — Encounter: Payer: Self-pay | Admitting: Urology

## 2017-10-30 ENCOUNTER — Other Ambulatory Visit: Payer: Self-pay

## 2017-10-30 ENCOUNTER — Encounter
Admission: RE | Admit: 2017-10-30 | Discharge: 2017-10-30 | Disposition: A | Discharge status: Home / Self Care | Payer: BLUE CROSS/BLUE SHIELD | Source: Ambulatory Visit | Attending: Urology | Admitting: Urology

## 2017-10-30 NOTE — Patient Instructions (Addendum)
Your procedure is scheduled on:  Thursday, November 05, 2017 Report to Same Day Surgery on the 2nd floor in the [facility]. To find out your arrival time, please call [phone] between 1PM - 3PM on: Wednesday, November 04, 2017  REMEMBER: Instructions that are not followed completely may result in serious medical risk, up to and including death; or upon the discretion of your surgeon and anesthesiologist your surgery may need to be rescheduled.  Do not eat food after midnight the night before your procedure.  No gum chewing or hard candies.  You may however, drink CLEAR liquids up to 2 hours before you are scheduled to arrive at the hospital for your procedure.  Do not drink clear liquids within 2 hours of the start of your surgery.  Clear liquids include: - water  - apple juice without pulp - clear gatorade - black coffee or tea (Do NOT add anything to the coffee or tea) Do NOT drink anything that is not on this list.  No Alcohol for 24 hours before or after surgery.  No Smoking including e-cigarettes for 24 hours prior to surgery. No chewable tobacco products for at least 6 hours prior to surgery. No nicotine patches on the day of surgery.  Notify your doctor if there is any change in your medical condition (cold, fever, infection).  Do not wear jewelry, make-up, hairpins, clips or nail polish.  Do not wear lotions, powders, or perfumes. You may wear deodorant.  Do not shave 48 hours prior to surgery. Men may shave face and neck.  Contacts and dentures may not be worn into surgery.  Do not bring valuables to the hospital. [facility] or [facility]ables.   TAKE THESE MEDICATIONS THE MORNING OF SURGERY WITH A SIP OF WATER:  none  NOW!  Stop ASPIRIN AND Anti-inflammatories such as Advil, Aleve, Ibuprofen, Motrin, Naproxen, Naprosyn, Goodie powder, or aspirin products. (May take Tylenol or Acetaminophen if needed.)  NOW!  Stop ANY OVER THE  COUNTER supplements until after surgery.   If you are being discharged the day of surgery, you will not be allowed to drive home. You will need someone to drive you home and stay with you that night.   If you are taking public transportation, you will need to have a responsible adult to with you.  Please call the number above if you have any questions about these instructions.

## 2017-11-05 ENCOUNTER — Ambulatory Visit: Payer: BLUE CROSS/BLUE SHIELD | Admitting: Anesthesiology

## 2017-11-05 ENCOUNTER — Ambulatory Visit
Admission: RE | Admit: 2017-11-05 | Discharge: 2017-11-05 | Disposition: A | Discharge status: Home / Self Care | Payer: BLUE CROSS/BLUE SHIELD | Source: Ambulatory Visit | Attending: Urology | Admitting: Urology

## 2017-11-05 ENCOUNTER — Encounter
Admission: RE | Disposition: A | Discharge status: Home / Self Care | Payer: Self-pay | Source: Ambulatory Visit | Attending: Urology

## 2017-11-05 DIAGNOSIS — F17200 Nicotine dependence, unspecified, uncomplicated: Secondary | ICD-10-CM | POA: Insufficient documentation

## 2017-11-05 DIAGNOSIS — N200 Calculus of kidney: Secondary | ICD-10-CM | POA: Insufficient documentation

## 2017-11-05 HISTORY — PX: EXTRACORPOREAL SHOCK WAVE LITHOTRIPSY: SHX1557

## 2017-11-05 LAB — URINE DRUG SCREEN, QUALITATIVE (ARMC ONLY)
Amphetamines, Ur Screen: NOT DETECTED
Barbiturates, Ur Screen: NOT DETECTED
Benzodiazepine, Ur Scrn: POSITIVE — AB
Cannabinoid 50 Ng, Ur ~~LOC~~: NOT DETECTED
Cocaine Metabolite,Ur ~~LOC~~: NOT DETECTED
MDMA (Ecstasy)Ur Screen: NOT DETECTED
Methadone Scn, Ur: NOT DETECTED
Opiate, Ur Screen: NOT DETECTED
Phencyclidine (PCP) Ur S: NOT DETECTED
Tricyclic, Ur Screen: POSITIVE — AB

## 2017-11-05 SURGERY — LITHOTRIPSY, ESWL
Anesthesia: General | Laterality: Right

## 2017-11-05 MED ORDER — ONDANSETRON HCL 4 MG/2ML IJ SOLN: 4.0000 mg | Freq: Once | INTRAMUSCULAR | Status: DC | PRN

## 2017-11-05 MED ORDER — DEXAMETHASONE SODIUM PHOSPHATE 10 MG/ML IJ SOLN
INTRAMUSCULAR | Status: AC
  Filled 2017-11-05: qty 1

## 2017-11-05 MED ORDER — MIDAZOLAM HCL 2 MG/2ML IJ SOLN
INTRAMUSCULAR | Status: AC
  Filled 2017-11-05: qty 2

## 2017-11-05 MED ORDER — PROMETHAZINE HCL 25 MG/ML IJ SOLN
INTRAMUSCULAR | Status: AC
  Administered 2017-11-05: 25 mg via INTRAVENOUS
  Filled 2017-11-05: qty 1

## 2017-11-05 MED ORDER — ONDANSETRON 8 MG PO TBDP: 8.0000 mg | ORAL_TABLET | Freq: Four times a day (QID) | ORAL | 3 refills | Status: DC | PRN

## 2017-11-05 MED ORDER — KETOROLAC TROMETHAMINE 30 MG/ML IJ SOLN
30.0000 mg | Freq: Once | INTRAMUSCULAR | Status: AC
  Administered 2017-11-05: 30 mg via INTRAVENOUS

## 2017-11-05 MED ORDER — FENTANYL CITRATE (PF) 100 MCG/2ML IJ SOLN: 25.0000 ug | INTRAMUSCULAR | Status: DC | PRN

## 2017-11-05 MED ORDER — TAMSULOSIN HCL 0.4 MG PO CAPS: 0.4000 mg | ORAL_CAPSULE | Freq: Every day | ORAL | 1 refills | Status: DC

## 2017-11-05 MED ORDER — LIDOCAINE HCL (PF) 2 % IJ SOLN
INTRAMUSCULAR | Status: AC
  Filled 2017-11-05: qty 10

## 2017-11-05 MED ORDER — DEXTROSE-NACL 5-0.45 % IV SOLN
INTRAVENOUS | Status: DC
  Administered 2017-11-05: 12:00:00 via INTRAVENOUS

## 2017-11-05 MED ORDER — ONDANSETRON HCL 4 MG/2ML IJ SOLN
INTRAMUSCULAR | Status: AC
  Filled 2017-11-05: qty 2

## 2017-11-05 MED ORDER — KETOROLAC TROMETHAMINE 30 MG/ML IJ SOLN
INTRAMUSCULAR | Status: AC
  Administered 2017-11-05: 30 mg via INTRAVENOUS
  Filled 2017-11-05: qty 1

## 2017-11-05 MED ORDER — FAMOTIDINE 20 MG PO TABS
20.0000 mg | ORAL_TABLET | Freq: Once | ORAL | Status: AC
  Administered 2017-11-05: 20 mg via ORAL

## 2017-11-05 MED ORDER — TAMSULOSIN HCL 0.4 MG PO CAPS
0.4000 mg | ORAL_CAPSULE | Freq: Every day | ORAL | Status: DC
  Administered 2017-11-05: 0.4 mg via ORAL
  Filled 2017-11-05 (×3): qty 1

## 2017-11-05 MED ORDER — FAMOTIDINE 20 MG PO TABS
ORAL_TABLET | ORAL | Status: AC
  Administered 2017-11-05: 20 mg via ORAL
  Filled 2017-11-05: qty 1

## 2017-11-05 MED ORDER — FUROSEMIDE 10 MG/ML IJ SOLN
10.0000 mg | Freq: Once | INTRAMUSCULAR | Status: AC
  Administered 2017-11-05: 10 mg via INTRAVENOUS

## 2017-11-05 MED ORDER — LEVOFLOXACIN 500 MG PO TABS
500.0000 mg | ORAL_TABLET | Freq: Once | ORAL | Status: AC
  Administered 2017-11-05: 500 mg via ORAL

## 2017-11-05 MED ORDER — PROPOFOL 10 MG/ML IV BOLUS
INTRAVENOUS | Status: AC
  Filled 2017-11-05: qty 20

## 2017-11-05 MED ORDER — FUROSEMIDE 10 MG/ML IJ SOLN
INTRAMUSCULAR | Status: AC
  Administered 2017-11-05: 10 mg via INTRAVENOUS
  Filled 2017-11-05: qty 2

## 2017-11-05 MED ORDER — LEVOFLOXACIN 500 MG PO TABS
ORAL_TABLET | ORAL | Status: AC
  Administered 2017-11-05: 500 mg via ORAL
  Filled 2017-11-05: qty 1

## 2017-11-05 MED ORDER — PROMETHAZINE HCL 25 MG/ML IJ SOLN
25.0000 mg | Freq: Once | INTRAMUSCULAR | Status: AC
  Administered 2017-11-05: 25 mg via INTRAVENOUS

## 2017-11-05 MED ORDER — FUROSEMIDE 10 MG/ML IJ SOLN
INTRAMUSCULAR | Status: AC
  Filled 2017-11-05: qty 2

## 2017-11-05 NOTE — Transfer of Care (Signed)
Immediate Anesthesia Transfer of Care Note  Patient: Thomas Huerta  Procedure(s) Performed: EXTRACORPOREAL SHOCK WAVE LITHOTRIPSY (ESWL) (Right )  Patient Location: PACU  Anesthesia Type:General  Level of Consciousness: sedated  Airway & Oxygen Therapy: Patient Spontanous Breathing and Patient connected to face mask oxygen  Post-op Assessment: Report given to RN and Post -op Vital signs reviewed and stable  Post vital signs: Reviewed and stable  Last Vitals:  Vitals:   11/05/17 1119 11/05/17 1434  BP: (!) 129/94 (!) 153/94  Pulse: (!) 58 (!) 46  Resp: 17 10  Temp: (!) 36.1 C (!) 36.2 C  SpO2: 100% 100%    Last Pain:  Vitals:   11/05/17 1434  TempSrc:   PainSc: Asleep         Complications: No apparent anesthesia complications

## 2017-11-05 NOTE — OR Nursing (Deleted)
Dr Evelene CroonWolff called related to pt complaint of extreme pain to right flank. Stated to give pt toradol 30mg  IV. Toradol 30mg  given IV.

## 2017-11-05 NOTE — Discharge Instructions (Signed)
Lithotripsy, Care After °This sheet gives you information about how to care for yourself after your procedure. Your health care provider may also give you more specific instructions. If you have problems or questions, contact your health care provider. °What can I expect after the procedure? °After the procedure, it is common to have: °· Some blood in your urine. This should only last for a few days. °· Soreness in your back, sides, or upper abdomen for a few days. °· Blotches or bruises on your back where the pressure wave entered the skin. °· Pain, discomfort, or nausea when pieces (fragments) of the kidney stone move through the tube that carries urine from the kidney to the bladder (ureter). Stone fragments may pass soon after the procedure, but they may continue to pass for up to 4-8 weeks. °? If you have severe pain or nausea, contact your health care provider. This may be caused by a large stone that was not broken up, and this may mean that you need more treatment. °· Some pain or discomfort during urination. °· Some pain or discomfort in the lower abdomen or (in men) at the base of the penis. ° °Follow these instructions at home: °Medicines °· Take over-the-counter and prescription medicines only as told by your health care provider. °· If you were prescribed an antibiotic medicine, take it as told by your health care provider. Do not stop taking the antibiotic even if you start to feel better. °· Do not drive for 24 hours if you were given a medicine to help you relax (sedative). °· Do not drive or use heavy machinery while taking prescription pain medicine. °Eating and drinking °· Drink enough water and fluids to keep your urine clear or pale yellow. This helps any remaining pieces of the stone to pass. It can also help prevent new stones from forming. °· Eat plenty of fresh fruits and vegetables. °· Follow instructions from your health care provider about eating and drinking restrictions. You may be  instructed: °? To reduce how much salt (sodium) you eat or drink. Check ingredients and nutrition facts on packaged foods and beverages. °? To reduce how much meat you eat. °· Eat the recommended amount of calcium for your age and gender. Ask your health care provider how much calcium you should have. °General instructions °· Get plenty of rest. °· Most people can resume normal activities 1-2 days after the procedure. Ask your health care provider what activities are safe for you. °· If directed, strain all urine through the strainer that was provided by your health care provider. °? Keep all fragments for your health care provider to see. Any stones that are found may be sent to a medical lab for examination. The stone may be as small as a grain of salt. °· Keep all follow-up visits as told by your health care provider. This is important. °Contact a health care provider if: °· You have pain that is severe or does not get better with medicine. °· You have nausea that is severe or does not go away. °· You have blood in your urine longer than your health care provider told you to expect. °· You have more blood in your urine. °· You have pain during urination that does not go away. °· You urinate more frequently than usual and this does not go away. °· You develop a rash or any other possible signs of an allergic reaction. °Get help right away if: °· You have severe pain in   your back, sides, or upper abdomen. °· You have severe pain while urinating. °· Your urine is very dark red. °· You have blood in your stool (feces). °· You cannot pass any urine at all. °· You feel a strong urge to urinate after emptying your bladder. °· You have a fever or chills. °· You develop shortness of breath, difficulty breathing, or chest pain. °· You have severe nausea that leads to persistent vomiting. °· You faint. °Summary °· After this procedure, it is common to have some pain, discomfort, or nausea when pieces (fragments) of the  kidney stone move through the tube that carries urine from the kidney to the bladder (ureter). If this pain or nausea is severe, however, you should contact your health care provider. °· Most people can resume normal activities 1-2 days after the procedure. Ask your health care provider what activities are safe for you. °· Drink enough water and fluids to keep your urine clear or pale yellow. This helps any remaining pieces of the stone to pass, and it can help prevent new stones from forming. °· If directed, strain your urine and keep all fragments for your health care provider to see. Fragments or stones may be as small as a grain of salt. °· Get help right away if you have severe pain in your back, sides, or upper abdomen or have severe pain while urinating. °This information is not intended to replace advice given to you by your health care provider. Make sure you discuss any questions you have with your health care provider. °Document Released: 11/23/2007 Document Revised: 09/24/2016 Document Reviewed: 09/24/2016 °Elsevier Interactive Patient Education © 2018 [employer] ° °

## 2017-11-05 NOTE — OR Nursing (Signed)
Dr Evelene CroonWolff called related to patients complaint of severe pain. Toradol 30mg  IV ordered and given.

## 2017-11-05 NOTE — OR Nursing (Signed)
Discharge instructions per discharge instructions for [facility] patients discussed and given to pt and family. Both voice understanding.

## 2017-11-05 NOTE — OR Nursing (Signed)
Dr Evelene CroonWolff called again related to pt complaint of pain (8 out of 10). Ordered Toradol 30 mg IV and phenergan 25mg  IV both given.

## 2017-11-05 NOTE — Anesthesia Postprocedure Evaluation (Signed)
Anesthesia Post Note  Patient: Orpah GreekClarence M Huerta  Procedure(s) Performed: EXTRACORPOREAL SHOCK WAVE LITHOTRIPSY (ESWL) (Right )  Patient location during evaluation: PACU Anesthesia Type: General Level of consciousness: awake and alert and oriented Pain management: pain level controlled Vital Signs Assessment: post-procedure vital signs reviewed and stable Respiratory status: spontaneous breathing Cardiovascular status: blood pressure returned to baseline Anesthetic complications: no     Last Vitals:  Vitals:   11/05/17 1459 11/05/17 1515  BP: (!) 148/95 (!) 154/90  Pulse: (!) 56 (!) 45  Resp: 14 16  Temp: 36.7 C (!) 36.3 C  SpO2: 100% 100%    Last Pain:  Vitals:   11/05/17 1515  TempSrc:   PainSc: 10-Worst pain ever                 Sandria Mcenroe

## 2017-11-05 NOTE — Anesthesia Preprocedure Evaluation (Addendum)
Anesthesia Evaluation  Patient identified by MRN, date of birth, ID band Patient awake    Reviewed: Allergy & Precautions, NPO status , Patient's Chart, lab work & pertinent test results, reviewed documented beta blocker date and time   History of Anesthesia Complications (+) history of anesthetic complications  Airway Mallampati: II  TM Distance: >3 FB     Dental  (+) Chipped   Pulmonary Current Smoker,           Cardiovascular      Neuro/Psych    GI/Hepatic   Endo/Other    Renal/GU      Musculoskeletal   Abdominal   Peds  Hematology   Anesthesia Other Findings g-bypass. No fentanyl.  Reproductive/Obstetrics                             Anesthesia Physical Anesthesia Plan  ASA: III  Anesthesia Plan: General   Post-op Pain Management:    Induction: Intravenous  PONV Risk Score and Plan:   Airway Management Planned:   Additional Equipment:   Intra-op Plan:   Post-operative Plan:   Informed Consent: I have reviewed the patients History and Physical, chart, labs and discussed the procedure including the risks, benefits and alternatives for the proposed anesthesia with the patient or authorized representative who has indicated his/her understanding and acceptance.     Plan Discussed with: CRNA  Anesthesia Plan Comments:         Anesthesia Quick Evaluation

## 2017-11-05 NOTE — Anesthesia Post-op Follow-up Note (Signed)
Anesthesia QCDR form completed.        

## 2017-11-06 ENCOUNTER — Encounter: Payer: Self-pay | Admitting: Urology

## 2018-08-03 DIAGNOSIS — R011 Cardiac murmur, unspecified: Secondary | ICD-10-CM | POA: Diagnosis not present

## 2018-08-03 DIAGNOSIS — R069 Unspecified abnormalities of breathing: Secondary | ICD-10-CM | POA: Diagnosis not present

## 2018-08-03 DIAGNOSIS — R609 Edema, unspecified: Secondary | ICD-10-CM | POA: Diagnosis not present

## 2018-08-04 ENCOUNTER — Ambulatory Visit: Payer: BLUE CROSS/BLUE SHIELD | Admitting: Primary Care

## 2018-09-02 ENCOUNTER — Other Ambulatory Visit: Payer: Self-pay

## 2018-09-02 DIAGNOSIS — I824Z2 Acute embolism and thrombosis of unspecified deep veins of left distal lower extremity: Secondary | ICD-10-CM

## 2018-09-08 ENCOUNTER — Other Ambulatory Visit: Payer: Self-pay

## 2018-09-08 ENCOUNTER — Encounter: Payer: Self-pay | Admitting: Vascular Surgery

## 2018-09-08 ENCOUNTER — Ambulatory Visit (HOSPITAL_COMMUNITY)
Admission: RE | Admit: 2018-09-08 | Discharge: 2018-09-08 | Disposition: A | Discharge status: Home / Self Care | Payer: BLUE CROSS/BLUE SHIELD | Source: Ambulatory Visit | Attending: Vascular Surgery | Admitting: Vascular Surgery

## 2018-09-08 ENCOUNTER — Other Ambulatory Visit: Payer: Self-pay | Admitting: *Deleted

## 2018-09-08 ENCOUNTER — Encounter: Payer: Self-pay | Admitting: *Deleted

## 2018-09-08 ENCOUNTER — Ambulatory Visit (INDEPENDENT_AMBULATORY_CARE_PROVIDER_SITE_OTHER): Payer: BLUE CROSS/BLUE SHIELD | Admitting: Vascular Surgery

## 2018-09-08 VITALS — BP 139/92 | HR 55 | Temp 97.6°F | Resp 18 | Ht 69.0 in | Wt 149.0 lb

## 2018-09-08 DIAGNOSIS — I82402 Acute embolism and thrombosis of unspecified deep veins of left lower extremity: Secondary | ICD-10-CM

## 2018-09-08 DIAGNOSIS — I824Z2 Acute embolism and thrombosis of unspecified deep veins of left distal lower extremity: Secondary | ICD-10-CM | POA: Diagnosis present

## 2018-09-08 NOTE — Progress Notes (Signed)
Referring Physician:  Cletis Athens NP  Patient name: Thomas Huerta MRN: [id number] DOB: 1956/08/15 Sex: male  REASON FOR CONSULT: Left leg DVT with swelling  HPI: Thomas Huerta is a 63 y.o. male, with a 6-week history of bilateral leg swelling.  About 2 to 3 weeks ago the left leg became worse.  Ultrasound was obtained which showed a DVT throughout the left leg extending up to the level of the common femoral vein.  The patient was placed on Eliquis about 2 weeks ago.  Swelling has improved slightly but is still fairly extensive.  He is very limited in his walking distance due to the swelling in the left leg.  He denies prior history of DVT.  He states that he did have incidence of clotting in his mother and father after they developed cancer late in age.  He has had a history of gastric bypass in the past but no real metabolic complications related to this.  He states he was anemic recently.    Past Medical History:  Diagnosis Date  . Back pain    chronic  . Complication of anesthesia    over medicated with Fentanyl  . History of kidney stones    5th time treating stones in 15 years  . Obesity    history of prior to gastric bypass   Past Surgical History:  Procedure Laterality Date  . CYST EXCISION     left lower back beside the spine  . CYSTOSCOPY    . CYSTOSCOPY W/ URETERAL STENT REMOVAL Left 08/18/2017   Procedure: CYSTOSCOPY WITH STENT REMOVAL;  Surgeon: Orson Ape, MD;  Location: [facility];  Service: Urology;  Laterality: Left;  . CYSTOSCOPY WITH STENT PLACEMENT Left 06/16/2017   Procedure: CYSTOSCOPY WITH STENT PLACEMENT;  Surgeon: Orson Ape, MD;  Location: [facility];  Service: Urology;  Laterality: Left;  . EXTRACORPOREAL SHOCK WAVE LITHOTRIPSY Left 06/25/2017   Procedure: EXTRACORPOREAL SHOCK WAVE LITHOTRIPSY (ESWL);  Surgeon: Orson Ape, MD;  Location: [facility];  Service: Urology;  Laterality: Left;  . EXTRACORPOREAL SHOCK WAVE LITHOTRIPSY Left  08/06/2017   Procedure: EXTRACORPOREAL SHOCK WAVE LITHOTRIPSY (ESWL);  Surgeon: Orson Ape, MD;  Location: [facility];  Service: Urology;  Laterality: Left;  . EXTRACORPOREAL SHOCK WAVE LITHOTRIPSY Right 11/05/2017   Procedure: EXTRACORPOREAL SHOCK WAVE LITHOTRIPSY (ESWL);  Surgeon: Orson Ape, MD;  Location: [facility];  Service: Urology;  Laterality: Right;  . GASTRIC BYPASS  2004  . HERNIA REPAIR Bilateral    inguinal hernias  . TONSILLECTOMY     and addenoids  . TONSILLECTOMY AND ADENOIDECTOMY    . URETEROSCOPY WITH HOLMIUM LASER LITHOTRIPSY Left 06/16/2017   Procedure: URETEROSCOPY WITH HOLMIUM LASER LITHOTRIPSY;  Surgeon: Orson Ape, MD;  Location: [facility];  Service: Urology;  Laterality: Left;    Family History  Problem Relation Age of Onset  . Lymphoma Mother   . Colon cancer Father     SOCIAL HISTORY: Social History   Socioeconomic History  . Marital status: Married    Spouse name: Not on file  . Number of children: Not on file  . Years of education: Not on file  . Highest education level: Not on file  Occupational History  . Not on file  Social Needs  . Financial resource strain: Not on file  . Food insecurity:    Worry: Not on file    Inability: Not on file  . Transportation needs:    Medical:  Not on file    Non-medical: Not on file  Tobacco Use  . Smoking status: Current Every Day Smoker    Packs/day: 1.00    Types: Cigarettes  . Smokeless tobacco: Never Used  Substance and Sexual Activity  . Alcohol use: No  . Drug use: Yes    Types: Oxycodone    Comment: past history age 26 with perscription Percocet  . Sexual activity: Not on file  Lifestyle  . Physical activity:    Days per week: Not on file    Minutes per session: Not on file  . Stress: Not on file  Relationships  . Social connections:    Talks on phone: Not on file    Gets together: Not on file    Attends religious service: Not on file    Active member of club or organization:  Not on file    Attends meetings of clubs or organizations: Not on file    Relationship status: Not on file  . Intimate partner violence:    Fear of current or ex partner: Not on file    Emotionally abused: Not on file    Physically abused: Not on file    Forced sexual activity: Not on file  Other Topics Concern  . Not on file  Social History Narrative  . Not on file    Allergies  Allergen Reactions  . Fentanyl Other (See Comments)    Sedated for days    Current Outpatient Medications  Medication Sig Dispense Refill  . buprenorphine-naloxone (SUBOXONE) 2-0.5 mg SUBL SL tablet Place 1 tablet under the tongue 3 (three) times daily.    . diphenhydrAMINE (BENADRYL) 25 MG tablet Take 25 mg by mouth every 6 (six) hours as needed.    . Multiple Vitamins-Minerals (MULTIVITAMIN WITH MINERALS) tablet Take 1 tablet by mouth daily.    . tamsulosin (FLOMAX) 0.4 MG CAPS capsule Take 1 capsule (0.4 mg total) by mouth daily. 30 capsule 11  . ondansetron (ZOFRAN ODT) 8 MG disintegrating tablet Take 0.5 tablets (4 mg total) by mouth every 6 (six) hours as needed for nausea or vomiting. (Patient not taking: Reported on 10/30/2017) 4 tablet 3  . ondansetron (ZOFRAN ODT) 8 MG disintegrating tablet Take 1 tablet (8 mg total) by mouth every 6 (six) hours as needed for nausea or vomiting. (Patient not taking: Reported on 09/08/2018) 10 tablet 3   No current facility-administered medications for this visit.     ROS:   General:  No weight loss, Fever, chills  HEENT: No recent headaches, no nasal bleeding, no visual changes, no sore throat  Neurologic: No dizziness, blackouts, seizures. No recent symptoms of stroke or mini- stroke. No recent episodes of slurred speech, or temporary blindness.  Cardiac: No recent episodes of chest pain/pressure, no shortness of breath at rest.  No shortness of breath with exertion.  Denies history of atrial fibrillation or irregular heartbeat  Vascular: No history of  rest pain in feet.  No history of claudication.  No history of non-healing ulcer, No history of DVT   Pulmonary: No home oxygen, no productive cough, no hemoptysis,  No asthma or wheezing  Musculoskeletal:  [ ]  Arthritis, [ ]  Low back pain,  [ ]  Joint pain  Hematologic:No history of hypercoagulable state.  No history of easy bleeding.  No history of anemia  Gastrointestinal: No hematochezia or melena,  No gastroesophageal reflux, no trouble swallowing  Urinary: [ ]  chronic Kidney disease, [ ]  on HD - [ ]   MWF or [ ]  TTHS, [ ]  Burning with urination, [ ]  Frequent urination, [ ]  Difficulty urinating;   Skin: No rashes  Psychological: No history of anxiety,  No history of depression   Physical Examination  Vitals:   09/08/18 0851  BP: (!) 139/92  Pulse: (!) 55  Resp: 18  Temp: 97.6 F (36.4 C)  TempSrc: Oral  SpO2: 99%  Weight: 149 lb (67.6 kg)  Height: 5\' 9"  (1.753 m)    Body mass index is 22 kg/m.  General:  Alert and oriented, no acute distress HEENT: Normal Neck: No bruit or JVD Pulmonary: Clear to auscultation bilaterally Cardiac: Regular Rate and Rhythm without murmur Abdomen: Soft, non-tender, non-distended, no mass, well-healed midline scar no evidence of hernia Skin: No rash Extremity Pulses:  2+ radial, brachial, femoral, dorsalis pedis, posterior tibial pulses bilaterally Musculoskeletal: No deformity left and right leg edema right leg edema is primarily below the knee 1-2+ edema.  Left leg is approximately 20% larger than the right with tense edema.  Neurologic: Upper and lower extremity motor 5/5 and symmetric  DATA:  Patient had an ileal caval DVT ultrasound today which showed no evidence of DVT in the iliac veins or IVC.  He did have occlusive DVT in the superficial femoral and common femoral vein.  ASSESSMENT: Patient with extensive DVT left leg currently on Eliquis fairly significant debilitating symptoms in his left leg secondary to swelling.   PLAN:  Left leg venogram popliteal vein approach prone position possible IVUS possible thrombolysis possible penumbra.  This is scheduled for Friday, September 10, 2018.  Risk benefits possible complications and procedure details were discussed with the patient today including not limited to bleeding infection vessel injury and need for ICU care at least overnight and possibly a few days hospitalization.  He understands and agrees to proceed.   Fabienne Bruns, MD [facility] Office: [phone] Pager: [phone]

## 2018-09-08 NOTE — H&P (View-Only) (Signed)
  Referring Physician:  Melissa Rose NP  Patient name: Thomas Huerta MRN: [id number] DOB: 07/16/1956 Sex: male  REASON FOR CONSULT: Left leg DVT with swelling  HPI: Tariq M Makki is a 62 y.o. male, with a 6-week history of bilateral leg swelling.  About 2 to 3 weeks ago the left leg became worse.  Ultrasound was obtained which showed a DVT throughout the left leg extending up to the level of the common femoral vein.  The patient was placed on Eliquis about 2 weeks ago.  Swelling has improved slightly but is still fairly extensive.  He is very limited in his walking distance due to the swelling in the left leg.  He denies prior history of DVT.  He states that he did have incidence of clotting in his mother and father after they developed cancer late in age.  He has had a history of gastric bypass in the past but no real metabolic complications related to this.  He states he was anemic recently.    Past Medical History:  Diagnosis Date  . Back pain    chronic  . Complication of anesthesia    over medicated with Fentanyl  . History of kidney stones    5th time treating stones in 15 years  . Obesity    history of prior to gastric bypass   Past Surgical History:  Procedure Laterality Date  . CYST EXCISION     left lower back beside the spine  . CYSTOSCOPY    . CYSTOSCOPY W/ URETERAL STENT REMOVAL Left 08/18/2017   Procedure: CYSTOSCOPY WITH STENT REMOVAL;  Surgeon: Wolff, Michael R, MD;  Location: [facility];  Service: Urology;  Laterality: Left;  . CYSTOSCOPY WITH STENT PLACEMENT Left 06/16/2017   Procedure: CYSTOSCOPY WITH STENT PLACEMENT;  Surgeon: Wolff, Michael R, MD;  Location: [facility];  Service: Urology;  Laterality: Left;  . EXTRACORPOREAL SHOCK WAVE LITHOTRIPSY Left 06/25/2017   Procedure: EXTRACORPOREAL SHOCK WAVE LITHOTRIPSY (ESWL);  Surgeon: Wolff, Michael R, MD;  Location: [facility];  Service: Urology;  Laterality: Left;  . EXTRACORPOREAL SHOCK WAVE LITHOTRIPSY Left  08/06/2017   Procedure: EXTRACORPOREAL SHOCK WAVE LITHOTRIPSY (ESWL);  Surgeon: Wolff, Michael R, MD;  Location: [facility];  Service: Urology;  Laterality: Left;  . EXTRACORPOREAL SHOCK WAVE LITHOTRIPSY Right 11/05/2017   Procedure: EXTRACORPOREAL SHOCK WAVE LITHOTRIPSY (ESWL);  Surgeon: Wolff, Michael R, MD;  Location: [facility];  Service: Urology;  Laterality: Right;  . GASTRIC BYPASS  2004  . HERNIA REPAIR Bilateral    inguinal hernias  . TONSILLECTOMY     and addenoids  . TONSILLECTOMY AND ADENOIDECTOMY    . URETEROSCOPY WITH HOLMIUM LASER LITHOTRIPSY Left 06/16/2017   Procedure: URETEROSCOPY WITH HOLMIUM LASER LITHOTRIPSY;  Surgeon: Wolff, Michael R, MD;  Location: [facility];  Service: Urology;  Laterality: Left;    Family History  Problem Relation Age of Onset  . Lymphoma Mother   . Colon cancer Father     SOCIAL HISTORY: Social History   Socioeconomic History  . Marital status: Married    Spouse name: Not on file  . Number of children: Not on file  . Years of education: Not on file  . Highest education level: Not on file  Occupational History  . Not on file  Social Needs  . Financial resource strain: Not on file  . Food insecurity:    Worry: Not on file    Inability: Not on file  . Transportation needs:    Medical:   Not on file    Non-medical: Not on file  Tobacco Use  . Smoking status: Current Every Day Smoker    Packs/day: 1.00    Types: Cigarettes  . Smokeless tobacco: Never Used  Substance and Sexual Activity  . Alcohol use: No  . Drug use: Yes    Types: Oxycodone    Comment: past history age 47 with perscription Percocet  . Sexual activity: Not on file  Lifestyle  . Physical activity:    Days per week: Not on file    Minutes per session: Not on file  . Stress: Not on file  Relationships  . Social connections:    Talks on phone: Not on file    Gets together: Not on file    Attends religious service: Not on file    Active member of club or organization:  Not on file    Attends meetings of clubs or organizations: Not on file    Relationship status: Not on file  . Intimate partner violence:    Fear of current or ex partner: Not on file    Emotionally abused: Not on file    Physically abused: Not on file    Forced sexual activity: Not on file  Other Topics Concern  . Not on file  Social History Narrative  . Not on file    Allergies  Allergen Reactions  . Fentanyl Other (See Comments)    Sedated for days    Current Outpatient Medications  Medication Sig Dispense Refill  . buprenorphine-naloxone (SUBOXONE) 2-0.5 mg SUBL SL tablet Place 1 tablet under the tongue 3 (three) times daily.    . diphenhydrAMINE (BENADRYL) 25 MG tablet Take 25 mg by mouth every 6 (six) hours as needed.    . Multiple Vitamins-Minerals (MULTIVITAMIN WITH MINERALS) tablet Take 1 tablet by mouth daily.    . tamsulosin (FLOMAX) 0.4 MG CAPS capsule Take 1 capsule (0.4 mg total) by mouth daily. 30 capsule 11  . ondansetron (ZOFRAN ODT) 8 MG disintegrating tablet Take 0.5 tablets (4 mg total) by mouth every 6 (six) hours as needed for nausea or vomiting. (Patient not taking: Reported on 10/30/2017) 4 tablet 3  . ondansetron (ZOFRAN ODT) 8 MG disintegrating tablet Take 1 tablet (8 mg total) by mouth every 6 (six) hours as needed for nausea or vomiting. (Patient not taking: Reported on 09/08/2018) 10 tablet 3   No current facility-administered medications for this visit.     ROS:   General:  No weight loss, Fever, chills  HEENT: No recent headaches, no nasal bleeding, no visual changes, no sore throat  Neurologic: No dizziness, blackouts, seizures. No recent symptoms of stroke or mini- stroke. No recent episodes of slurred speech, or temporary blindness.  Cardiac: No recent episodes of chest pain/pressure, no shortness of breath at rest.  No shortness of breath with exertion.  Denies history of atrial fibrillation or irregular heartbeat  Vascular: No history of  rest pain in feet.  No history of claudication.  No history of non-healing ulcer, No history of DVT   Pulmonary: No home oxygen, no productive cough, no hemoptysis,  No asthma or wheezing  Musculoskeletal:  [ ] Arthritis, [ ] Low back pain,  [ ] Joint pain  Hematologic:No history of hypercoagulable state.  No history of easy bleeding.  No history of anemia  Gastrointestinal: No hematochezia or melena,  No gastroesophageal reflux, no trouble swallowing  Urinary: [ ] chronic Kidney disease, [ ] on HD - [ ]   MWF or [ ] TTHS, [ ] Burning with urination, [ ] Frequent urination, [ ] Difficulty urinating;   Skin: No rashes  Psychological: No history of anxiety,  No history of depression   Physical Examination  Vitals:   09/08/18 0851  BP: (!) 139/92  Pulse: (!) 55  Resp: 18  Temp: 97.6 F (36.4 C)  TempSrc: Oral  SpO2: 99%  Weight: 149 lb (67.6 kg)  Height: 5' 9" (1.753 m)    Body mass index is 22 kg/m.  General:  Alert and oriented, no acute distress HEENT: Normal Neck: No bruit or JVD Pulmonary: Clear to auscultation bilaterally Cardiac: Regular Rate and Rhythm without murmur Abdomen: Soft, non-tender, non-distended, no mass, well-healed midline scar no evidence of hernia Skin: No rash Extremity Pulses:  2+ radial, brachial, femoral, dorsalis pedis, posterior tibial pulses bilaterally Musculoskeletal: No deformity left and right leg edema right leg edema is primarily below the knee 1-2+ edema.  Left leg is approximately 20% larger than the right with tense edema.  Neurologic: Upper and lower extremity motor 5/5 and symmetric  DATA:  Patient had an ileal caval DVT ultrasound today which showed no evidence of DVT in the iliac veins or IVC.  He did have occlusive DVT in the superficial femoral and common femoral vein.  ASSESSMENT: Patient with extensive DVT left leg currently on Eliquis fairly significant debilitating symptoms in his left leg secondary to swelling.   PLAN:  Left leg venogram popliteal vein approach prone position possible IVUS possible thrombolysis possible penumbra.  This is scheduled for Friday, September 10, 2018.  Risk benefits possible complications and procedure details were discussed with the patient today including not limited to bleeding infection vessel injury and need for ICU care at least overnight and possibly a few days hospitalization.  He understands and agrees to proceed.   Erubiel Manasco, MD [facility] Office: [phone] Pager: [phone]   

## 2018-09-09 ENCOUNTER — Encounter: Payer: Self-pay | Admitting: Nurse Practitioner

## 2018-09-10 ENCOUNTER — Inpatient Hospital Stay (HOSPITAL_COMMUNITY)
Admission: AD | Admit: 2018-09-10 | Discharge: 2018-09-12 | DRG: 271 | Disposition: A | Discharge status: Home / Self Care | Payer: BLUE CROSS/BLUE SHIELD | Source: Ambulatory Visit | Attending: Vascular Surgery | Admitting: Vascular Surgery

## 2018-09-10 ENCOUNTER — Encounter (HOSPITAL_COMMUNITY)
Admission: AD | Disposition: A | Discharge status: Home / Self Care | Payer: Self-pay | Source: Ambulatory Visit | Attending: Vascular Surgery

## 2018-09-10 DIAGNOSIS — I82412 Acute embolism and thrombosis of left femoral vein: Secondary | ICD-10-CM | POA: Diagnosis present

## 2018-09-10 DIAGNOSIS — I82432 Acute embolism and thrombosis of left popliteal vein: Secondary | ICD-10-CM | POA: Diagnosis present

## 2018-09-10 DIAGNOSIS — I82402 Acute embolism and thrombosis of unspecified deep veins of left lower extremity: Secondary | ICD-10-CM | POA: Diagnosis not present

## 2018-09-10 DIAGNOSIS — Z885 Allergy status to narcotic agent status: Secondary | ICD-10-CM | POA: Diagnosis not present

## 2018-09-10 DIAGNOSIS — Z9884 Bariatric surgery status: Secondary | ICD-10-CM | POA: Diagnosis not present

## 2018-09-10 DIAGNOSIS — Z79899 Other long term (current) drug therapy: Secondary | ICD-10-CM | POA: Diagnosis not present

## 2018-09-10 DIAGNOSIS — E440 Moderate protein-calorie malnutrition: Secondary | ICD-10-CM | POA: Diagnosis present

## 2018-09-10 DIAGNOSIS — I871 Compression of vein: Secondary | ICD-10-CM | POA: Diagnosis present

## 2018-09-10 DIAGNOSIS — Z6822 Body mass index (BMI) 22.0-22.9, adult: Secondary | ICD-10-CM

## 2018-09-10 DIAGNOSIS — F17210 Nicotine dependence, cigarettes, uncomplicated: Secondary | ICD-10-CM | POA: Diagnosis present

## 2018-09-10 HISTORY — PX: PERIPHERAL VASCULAR THROMBECTOMY: CATH118306

## 2018-09-10 HISTORY — PX: LOWER EXTREMITY VENOGRAPHY: CATH118253

## 2018-09-10 LAB — CBC
HCT: 31.1 % — ABNORMAL LOW (ref 39.0–52.0)
HCT: 27.1 % — ABNORMAL LOW (ref 39.0–52.0)
Hemoglobin: 9.9 g/dL — ABNORMAL LOW (ref 13.0–17.0)
Hemoglobin: 8.4 g/dL — ABNORMAL LOW (ref 13.0–17.0)
MCH: 35.1 pg — ABNORMAL HIGH (ref 26.0–34.0)
MCH: 34.7 pg — ABNORMAL HIGH (ref 26.0–34.0)
MCHC: 31.8 g/dL (ref 30.0–36.0)
MCHC: 31 g/dL (ref 30.0–36.0)
MCV: 110.3 fL — ABNORMAL HIGH (ref 80.0–100.0)
MCV: 112 fL — ABNORMAL HIGH (ref 80.0–100.0)
Platelets: 309 10*3/uL (ref 150–400)
Platelets: 245 10*3/uL (ref 150–400)
RBC: 2.82 MIL/uL — ABNORMAL LOW (ref 4.22–5.81)
RBC: 2.42 MIL/uL — ABNORMAL LOW (ref 4.22–5.81)
RDW: 14.6 % (ref 11.5–15.5)
RDW: 14.6 % (ref 11.5–15.5)
WBC: 5 10*3/uL (ref 4.0–10.5)
WBC: 4.2 10*3/uL (ref 4.0–10.5)
nRBC: 0 % (ref 0.0–0.2)
nRBC: 0 % (ref 0.0–0.2)

## 2018-09-10 LAB — POCT I-STAT, CHEM 8
BUN: 18 mg/dL (ref 6–20)
Calcium, Ion: 1.14 mmol/L — ABNORMAL LOW (ref 1.15–1.40)
Chloride: 109 mmol/L (ref 98–111)
Creatinine, Ser: 0.9 mg/dL (ref 0.61–1.24)
Glucose, Bld: 76 mg/dL (ref 70–99)
HCT: 32 % — ABNORMAL LOW (ref 39.0–52.0)
Hemoglobin: 10.9 g/dL — ABNORMAL LOW (ref 13.0–17.0)
Potassium: 3.5 mmol/L (ref 3.5–5.1)
Sodium: 142 mmol/L (ref 135–145)
TCO2: 26 mmol/L (ref 22–32)

## 2018-09-10 LAB — HEPARIN LEVEL (UNFRACTIONATED)
Heparin Unfractionated: 1.62 IU/mL — ABNORMAL HIGH (ref 0.30–0.70)
Heparin Unfractionated: >2.2 IU/mL — ABNORMAL HIGH (ref 0.30–0.70)

## 2018-09-10 LAB — APTT: aPTT: >200 seconds (ref 24–36)

## 2018-09-10 LAB — FIBRINOGEN
Fibrinogen: 256 mg/dL (ref 210–475)
Fibrinogen: 172 mg/dL — ABNORMAL LOW (ref 210–475)

## 2018-09-10 LAB — MRSA PCR SCREENING: MRSA by PCR: NEGATIVE

## 2018-09-10 SURGERY — LOWER EXTREMITY VENOGRAPHY
Anesthesia: LOCAL | Laterality: Left

## 2018-09-10 MED ORDER — LIDOCAINE HCL (PF) 1 % IJ SOLN
INTRAMUSCULAR | Status: DC | PRN
  Administered 2018-09-10: 5 mL

## 2018-09-10 MED ORDER — SUVOREXANT 5 MG PO TABS: 5.0000 mg | ORAL_TABLET | Freq: Every evening | ORAL | Status: DC | PRN

## 2018-09-10 MED ORDER — POTASSIUM CHLORIDE CRYS ER 20 MEQ PO TBCR
20.0000 meq | EXTENDED_RELEASE_TABLET | Freq: Once | ORAL | Status: AC
  Administered 2018-09-10: 20 meq via ORAL
  Filled 2018-09-10: qty 1

## 2018-09-10 MED ORDER — ADULT MULTIVITAMIN W/MINERALS CH
1.0000 | ORAL_TABLET | Freq: Every day | ORAL | Status: DC
  Administered 2018-09-12: 1 via ORAL
  Filled 2018-09-10: qty 1

## 2018-09-10 MED ORDER — SODIUM CHLORIDE 0.9 % IV SOLN
250.0000 mL | INTRAVENOUS | Status: DC | PRN
  Administered 2018-09-10: 250 mL via INTRAVENOUS

## 2018-09-10 MED ORDER — LIDOCAINE HCL (PF) 1 % IJ SOLN
INTRAMUSCULAR | Status: AC
  Filled 2018-09-10: qty 30

## 2018-09-10 MED ORDER — HEPARIN SODIUM (PORCINE) 1000 UNIT/ML IJ SOLN
INTRAMUSCULAR | Status: DC | PRN
  Administered 2018-09-10: 5000 [IU] via INTRAVENOUS

## 2018-09-10 MED ORDER — MORPHINE SULFATE (PF) 2 MG/ML IV SOLN
INTRAVENOUS | Status: AC
  Filled 2018-09-10: qty 1

## 2018-09-10 MED ORDER — MORPHINE SULFATE (PF) 2 MG/ML IV SOLN
2.0000 mg | INTRAVENOUS | Status: DC | PRN
  Administered 2018-09-10 – 2018-09-12 (×8): 2 mg via INTRAVENOUS
  Filled 2018-09-10 (×8): qty 1

## 2018-09-10 MED ORDER — MIDAZOLAM HCL 2 MG/2ML IJ SOLN
INTRAMUSCULAR | Status: DC | PRN
  Administered 2018-09-10 (×2): 2 mg via INTRAVENOUS

## 2018-09-10 MED ORDER — HEPARIN (PORCINE) IN NACL 1000-0.9 UT/500ML-% IV SOLN
INTRAVENOUS | Status: DC | PRN
  Administered 2018-09-10: 500 mL

## 2018-09-10 MED ORDER — ALBUTEROL SULFATE (2.5 MG/3ML) 0.083% IN NEBU: 3.0000 mL | INHALATION_SOLUTION | Freq: Four times a day (QID) | RESPIRATORY_TRACT | Status: DC | PRN

## 2018-09-10 MED ORDER — PANTOPRAZOLE SODIUM 40 MG PO TBEC
40.0000 mg | DELAYED_RELEASE_TABLET | Freq: Every day | ORAL | Status: DC
  Administered 2018-09-10 – 2018-09-12 (×2): 40 mg via ORAL
  Filled 2018-09-10 (×2): qty 1

## 2018-09-10 MED ORDER — BUPRENORPHINE HCL-NALOXONE HCL 2-0.5 MG SL SUBL
1.0000 | SUBLINGUAL_TABLET | Freq: Three times a day (TID) | SUBLINGUAL | Status: DC
  Administered 2018-09-10 – 2018-09-12 (×6): 1 via SUBLINGUAL
  Filled 2018-09-10 (×6): qty 1

## 2018-09-10 MED ORDER — ZOLPIDEM TARTRATE 5 MG PO TABS
5.0000 mg | ORAL_TABLET | Freq: Every evening | ORAL | Status: DC | PRN
  Administered 2018-09-10 – 2018-09-11 (×2): 5 mg via ORAL
  Filled 2018-09-10 (×2): qty 1

## 2018-09-10 MED ORDER — MIDAZOLAM HCL 2 MG/2ML IJ SOLN
INTRAMUSCULAR | Status: AC
  Filled 2018-09-10: qty 2

## 2018-09-10 MED ORDER — LABETALOL HCL 5 MG/ML IV SOLN: 10.0000 mg | INTRAVENOUS | Status: DC | PRN

## 2018-09-10 MED ORDER — PRO-STAT SUGAR FREE PO LIQD
30.0000 mL | Freq: Two times a day (BID) | ORAL | Status: DC
  Administered 2018-09-10: 30 mL via ORAL
  Filled 2018-09-10: qty 30

## 2018-09-10 MED ORDER — ACETAMINOPHEN 325 MG PO TABS: 325.0000 mg | ORAL_TABLET | ORAL | Status: DC | PRN

## 2018-09-10 MED ORDER — IODIXANOL 320 MG/ML IV SOLN
INTRAVENOUS | Status: DC | PRN
  Administered 2018-09-10: 7 mL via INTRAVENOUS

## 2018-09-10 MED ORDER — DOCUSATE SODIUM 100 MG PO CAPS
100.0000 mg | ORAL_CAPSULE | Freq: Two times a day (BID) | ORAL | Status: DC
  Administered 2018-09-10 – 2018-09-12 (×3): 100 mg via ORAL
  Filled 2018-09-10 (×3): qty 1

## 2018-09-10 MED ORDER — SODIUM CHLORIDE 0.9 % IV SOLN
0.5000 mg/h | INTRAVENOUS | Status: DC
  Filled 2018-09-10 (×3): qty 10

## 2018-09-10 MED ORDER — SODIUM CHLORIDE 0.9 % IV SOLN
10.0000 mg | Freq: Once | INTRAVENOUS | Status: AC
  Administered 2018-09-10: 10 mg via INTRAVENOUS
  Filled 2018-09-10: qty 10

## 2018-09-10 MED ORDER — HYDRALAZINE HCL 20 MG/ML IJ SOLN: 5.0000 mg | INTRAMUSCULAR | Status: DC | PRN

## 2018-09-10 MED ORDER — METOPROLOL TARTRATE 5 MG/5ML IV SOLN: 2.0000 mg | INTRAVENOUS | Status: DC | PRN

## 2018-09-10 MED ORDER — SODIUM CHLORIDE 0.9 % IV SOLN
0.5000 mg/h | INTRAVENOUS | Status: DC
  Administered 2018-09-10: 0.5 mg/h
  Filled 2018-09-10 (×2): qty 10

## 2018-09-10 MED ORDER — MORPHINE SULFATE (PF) 10 MG/ML IV SOLN
2.0000 mg | INTRAVENOUS | Status: DC | PRN
  Administered 2018-09-10: 2 mg via INTRAVENOUS

## 2018-09-10 MED ORDER — ALUM & MAG HYDROXIDE-SIMETH 200-200-20 MG/5ML PO SUSP: 15.0000 mL | ORAL | Status: DC | PRN

## 2018-09-10 MED ORDER — HEPARIN (PORCINE) IN NACL 100-0.45 UNIT/ML-% IJ SOLN
500.0000 [IU]/h | INTRAMUSCULAR | Status: DC
  Administered 2018-09-10: 500 [IU]/h via INTRAVENOUS
  Filled 2018-09-10: qty 250

## 2018-09-10 MED ORDER — GUAIFENESIN-DM 100-10 MG/5ML PO SYRP: 15.0000 mL | ORAL_SOLUTION | ORAL | Status: DC | PRN

## 2018-09-10 MED ORDER — SODIUM CHLORIDE 0.9% FLUSH
3.0000 mL | Freq: Two times a day (BID) | INTRAVENOUS | Status: DC
  Administered 2018-09-10 – 2018-09-12 (×3): 3 mL via INTRAVENOUS

## 2018-09-10 MED ORDER — HEPARIN (PORCINE) IN NACL 100-0.45 UNIT/ML-% IJ SOLN
1150.0000 [IU]/h | INTRAMUSCULAR | Status: DC
  Administered 2018-09-11: 1150 [IU]/h via INTRAVENOUS
  Filled 2018-09-10 (×2): qty 250

## 2018-09-10 MED ORDER — SODIUM CHLORIDE 0.9% FLUSH: 3.0000 mL | INTRAVENOUS | Status: DC | PRN

## 2018-09-10 MED ORDER — ONDANSETRON HCL 4 MG/2ML IJ SOLN
4.0000 mg | Freq: Four times a day (QID) | INTRAMUSCULAR | Status: DC | PRN
  Administered 2018-09-11: 4 mg via INTRAVENOUS

## 2018-09-10 MED ORDER — SODIUM CHLORIDE 0.9 % IV SOLN: INTRAVENOUS | Status: DC

## 2018-09-10 MED ORDER — SPIRONOLACTONE 25 MG PO TABS
50.0000 mg | ORAL_TABLET | Freq: Every day | ORAL | Status: DC
  Administered 2018-09-10 – 2018-09-12 (×2): 50 mg via ORAL
  Filled 2018-09-10 (×2): qty 2

## 2018-09-10 MED ORDER — PHENOL 1.4 % MT LIQD: 1.0000 | OROMUCOSAL | Status: DC | PRN

## 2018-09-10 MED ORDER — DEXTROSE-NACL 5-0.45 % IV SOLN
INTRAVENOUS | Status: DC
  Administered 2018-09-10 (×2): via INTRAVENOUS

## 2018-09-10 MED ORDER — HEPARIN (PORCINE) IN NACL 1000-0.9 UT/500ML-% IV SOLN
INTRAVENOUS | Status: AC
  Filled 2018-09-10: qty 500

## 2018-09-10 MED ORDER — ACETAMINOPHEN 325 MG RE SUPP: 325.0000 mg | RECTAL | Status: DC | PRN

## 2018-09-10 SURGICAL SUPPLY — 18 items
BAG SNAP BAND KOVER 36X36 (MISCELLANEOUS) ×2 IMPLANT
CANISTER PENUMBRA ENGINE (MISCELLANEOUS) ×2 IMPLANT
CATH ANGIO 5F BER 100CM (CATHETERS) IMPLANT
CATH ANGIO 5F BER2 100CM (CATHETERS) ×2 IMPLANT
CATH INDIGO D 50CM (CATHETERS) ×2 IMPLANT
CATH INFUS 135CMX50CM (CATHETERS) ×2 IMPLANT
CATH VISIONS PV .035 IVUS (CATHETERS) ×2 IMPLANT
COVER DOME SNAP 22 D (MISCELLANEOUS) ×2 IMPLANT
KIT MICROPUNCTURE NIT STIFF (SHEATH) ×2 IMPLANT
PROTECTION STATION PRESSURIZED (MISCELLANEOUS) ×2
SHEATH PINNACLE 8F 10CM (SHEATH) ×4 IMPLANT
SHEATH PINNACLE 9F 10CM (SHEATH) ×2 IMPLANT
STATION PROTECTION PRESSURIZED (MISCELLANEOUS) ×1 IMPLANT
TRAY PV CATH (CUSTOM PROCEDURE TRAY) ×2 IMPLANT
TUBING ASPIRATION INDIGO (MISCELLANEOUS) ×2 IMPLANT
WIRE BENTSON .035X145CM (WIRE) ×2 IMPLANT
WIRE ROSEN-J .035X260CM (WIRE) ×2 IMPLANT
WIRE TORQFLEX AUST .018X40CM (WIRE) ×2 IMPLANT

## 2018-09-10 NOTE — Progress Notes (Signed)
Initial Nutrition Assessment  DOCUMENTATION CODES:   Non-severe (moderate) malnutrition in context of acute illness/injury  INTERVENTION:   - Add ProStat BID (each provides 100 kcal and 15 g protein) - Continue MVI with minerals - Suggest checking for deficiency of fat-soluble vitamins and minerals given h/o of gastric bypass and pt report of loose, foul-smelling stool  NUTRITION DIAGNOSIS:   Moderate Malnutrition related to acute illness as evidenced by percent weight loss, mild muscle depletion, mild fat depletion, edema.   GOAL:   Patient will meet greater than or equal to 90% of their needs   MONITOR:   PO intake, Supplement acceptance, Diet advancement  REASON FOR ASSESSMENT:   Malnutrition Screening Tool    ASSESSMENT:   62 yo male, admitted for surgery with dx of DVT. PMH significant for kidney stones, gastric bypass in 2004.  Labs: Hgb 10.9, Hct 32.0 Meds: colace, MVI with minerals, protonix  Pt on phone at time of visit. Describes UBW 160-165# (72.7-75 kg) with a 10-15# wt loss over the last 2-3 weeks --> 7-9% wt loss.   Typically eats 3 meals/day at home. Pt diagnosed with anemia 3-4 weeks ago. Since then, pt has been aiming for 60-90 g protein/day and was utilizing protein powder. Does take MVI daily.   Concerned about nutrient malabsorption - states he was told his blood clots were d/t malabsorption. Describes loose BM every morning. Stool does not float, but pt endorses foul odor. Denies nausea or vomiting, or difficulty chewing or swallowing.   Pt amenable to ProStat BID.   NUTRITION - FOCUSED PHYSICAL EXAM:   Most Recent Value  Orbital Region  Mild depletion  Upper Arm Region  Moderate depletion  Thoracic and Lumbar Region  Mild depletion  Buccal Region  Mild depletion  Temple Region  Mild depletion  Clavicle Bone Region  Mild depletion  Clavicle and Acromion Bone Region  Moderate depletion  Scapular Bone Region  Moderate depletion  Dorsal Hand   Mild depletion  Patellar Region  Mild depletion  Anterior Thigh Region  Mild depletion  Posterior Calf Region  Mild depletion  Edema (RD Assessment)  Mild  Hair  Reviewed  Eyes  Reviewed  Mouth  Reviewed  Skin  Reviewed  Nails  Reviewed      Diet Order:  75% of lunch on 10/25 (observed tray in room at nutrition visit) Diet Order            Diet clear liquid Room service appropriate? Yes; Fluid consistency: Thin  Diet effective now              EDUCATION NEEDS:  No education needs have been identified at this time  Skin:  Skin Assessment: Reviewed RN Assessment  Last BM:  PTA  Height:  Ht Readings from Last 1 Encounters:  09/10/18 5\' 9"  (1.753 m)    Weight:  Wt Readings from Last 1 Encounters:  09/10/18 68 kg    Ideal Body Weight:  72.7 kg  BMI:  Body mass index is 22.15 kg/m.  Estimated Nutritional Needs:   Kcal:  1782 kcal/day(MSJ x 1.2)  Protein:  82-102 g protein/day(1.2-1.5 g/kg)  Fluid:  1 mL/kcal or per MD discretion  Jolaine Artist, MS, RDN, LDN On-call pager: [phone]

## 2018-09-10 NOTE — Interval H&P Note (Signed)
History and Physical Interval Note:  09/10/2018 9:27 AM  Thomas Huerta  has presented today for surgery, with the diagnosis of dvt  The various methods of treatment have been discussed with the patient and family. After consideration of risks, benefits and other options for treatment, the patient has consented to  Procedure(s): LOWER EXTREMITY VENOGRAPHY (Left) as a surgical intervention .  The patient's history has been reviewed, patient examined, no change in status, stable for surgery.  I have reviewed the patient's chart and labs.  Questions were answered to the patient's satisfaction.     Fabienne Bruns

## 2018-09-10 NOTE — Op Note (Signed)
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  The patient was placed in supine position on the table initially and then flipped to a prone position.  Patient's left popliteal fossa was prepped and draped in usual sterile fashion.  Ultrasound was used to identify the left popliteal vein.  I initially used a micropuncture needle to cannulate the popliteal vein under ultrasound guidance.  Ultrasound showed that the popliteal vein was filled with thrombus.  Permanent hardcopy image was obtained for the patient's record.  I was unable to get the micropuncture wire to advance easily in the popliteal vein.  Therefore a standard introducer needle was used to cannulate the popliteal vein under ultrasound guidance and I was able to pass a 035 Bentson wire up into the more proximal popliteal vein.  I then placed the micropuncture sheath over this.  With this extra-support I was able to advance the guidewire all the way up to the level of the vena cava.  Micropuncture sheath was then swapped out for a 5 [place] sheath.  The [facility] IVUS catheter was then placed over the guidewire all the way up to the level of the vena cava and renal veins.  I then recorded the images pulling back through the vein.  The inferior vena cava was widely patent.  There seem to be a narrowing at the origin of the left common iliac  vein.  The remainder of the veins all the way down to the common femoral vein was normal in appearance.  Then encountered diffuse thrombus starting in the femoral vein and extending fully into the popliteal vein at the level of the puncture site.  A 5 French sheath was exchanged over a guidewire for an 8 [place] sheath.  At this point the cath the penumbra catheter was advanced over the wire all the way up to the level of the femoral vein.  This was then aspirated back with a rotating technique.  Large amounts of solid thrombotic material removed.  There was so much thrombus that the sheath became clogged.  I switched out the 8 [place] sheath for a 9 [place] sheath.  The penumbra was placed back up to the level of the femoral vein.  I passed this about 6 times.  At this point 10 mg of TPA dissolved in 100 cc of saline was infused into the vein and allowed to have a dwell time of about 10 minutes.  I then passed the penumbra catheter again all the way up to the level of the femoral vein and pulled this back again again removing large amounts of thrombus.  A contrast venogram was then performed by gently injecting contrast and doing in a sending venogram from the sheath all the way up to the level of the groin.  This still showed some residual thrombus primarily at the level of the sitz bone in the femoral vein.  The penumbra catheter was again placed back in and concentrated on this area.  I then placed the IVIS catheter back through  and again on pullback from the cava the left common iliac origin appeared to be narrowed.  There were no other obvious narrowings in the caval iliac system.  There was still thrombus within the femoral and popliteal vein.  Therefore at this point a multi sidehole lysis catheter was placed over a wire with its tip in the left mid iliac vein and extending all the way down to the popliteal vein.  This was then hooked up to TPA to be infused overnight.  The side-port of the sheath was infused  with heparin at 500 units/h.  The patient tolerated procedure well and there were no complications.  The patient will return to the operating room tomorrow for a second look study and possible left common iliac vein stenting  Fabienne Bruns, MD [facility] [place] Office: [phone] Pager: [phone]

## 2018-09-10 NOTE — Interval H&P Note (Signed)
History and Physical Interval Note:  09/10/2018 9:27 AM  Thomas Huerta  has presented today for surgery, with the diagnosis of dvt  The various methods of treatment have been discussed with the patient and family. After consideration of risks, benefits and other options for treatment, the patient has consented to  Procedure(s): LOWER EXTREMITY VENOGRAPHY (Left) as a surgical intervention .  The patient's history has been reviewed, patient examined, no change in status, stable for surgery.  I have reviewed the patient's chart and labs.  Questions were answered to the patient's satisfaction.     [name]   

## 2018-09-11 ENCOUNTER — Inpatient Hospital Stay (HOSPITAL_COMMUNITY): Payer: BLUE CROSS/BLUE SHIELD | Admitting: Anesthesiology

## 2018-09-11 ENCOUNTER — Inpatient Hospital Stay (HOSPITAL_COMMUNITY)
Admission: RE | Admit: 2018-09-11 | Payer: BLUE CROSS/BLUE SHIELD | Source: Ambulatory Visit | Admitting: Vascular Surgery

## 2018-09-11 ENCOUNTER — Encounter (HOSPITAL_COMMUNITY)
Admission: AD | Disposition: A | Discharge status: Home / Self Care | Payer: Self-pay | Source: Ambulatory Visit | Attending: Vascular Surgery

## 2018-09-11 HISTORY — PX: ANGIOPLASTY ILLIAC ARTERY: SHX5720

## 2018-09-11 HISTORY — PX: INSERTION OF ILIAC STENT: SHX6256

## 2018-09-11 HISTORY — PX: INTRAVASCULAR ULTRASOUND/IVUS: CATH118244

## 2018-09-11 HISTORY — PX: VENOGRAM: SHX5497

## 2018-09-11 LAB — APTT
aPTT: 36 seconds (ref 24–36)
aPTT: 35 seconds (ref 24–36)
aPTT: 68 seconds — ABNORMAL HIGH (ref 24–36)

## 2018-09-11 LAB — BASIC METABOLIC PANEL
Anion gap: 4 — ABNORMAL LOW (ref 5–15)
BUN: 7 mg/dL (ref 6–20)
CO2: 25 mmol/L (ref 22–32)
Calcium: 7.5 mg/dL — ABNORMAL LOW (ref 8.9–10.3)
Chloride: 110 mmol/L (ref 98–111)
Creatinine, Ser: 0.73 mg/dL (ref 0.61–1.24)
GFR calc Af Amer: >60 mL/min (ref >60)
GFR calc non Af Amer: >60 mL/min (ref >60)
Glucose, Bld: 86 mg/dL (ref 70–99)
Potassium: 3.8 mmol/L (ref 3.5–5.1)
Sodium: 139 mmol/L (ref 135–145)

## 2018-09-11 LAB — CBC
HCT: 30.3 % — ABNORMAL LOW (ref 39.0–52.0)
HCT: 29.3 % — ABNORMAL LOW (ref 39.0–52.0)
Hemoglobin: 9.4 g/dL — ABNORMAL LOW (ref 13.0–17.0)
Hemoglobin: 9.4 g/dL — ABNORMAL LOW (ref 13.0–17.0)
MCH: 34.3 pg — ABNORMAL HIGH (ref 26.0–34.0)
MCH: 35.3 pg — ABNORMAL HIGH (ref 26.0–34.0)
MCHC: 31 g/dL (ref 30.0–36.0)
MCHC: 32.1 g/dL (ref 30.0–36.0)
MCV: 110.6 fL — ABNORMAL HIGH (ref 80.0–100.0)
MCV: 110.2 fL — ABNORMAL HIGH (ref 80.0–100.0)
Platelets: 286 10*3/uL (ref 150–400)
Platelets: 263 10*3/uL (ref 150–400)
RBC: 2.74 MIL/uL — ABNORMAL LOW (ref 4.22–5.81)
RBC: 2.66 MIL/uL — ABNORMAL LOW (ref 4.22–5.81)
RDW: 14.6 % (ref 11.5–15.5)
RDW: 14.5 % (ref 11.5–15.5)
WBC: 5.2 10*3/uL (ref 4.0–10.5)
WBC: 4.6 10*3/uL (ref 4.0–10.5)
nRBC: 0 % (ref 0.0–0.2)
nRBC: 0 % (ref 0.0–0.2)

## 2018-09-11 LAB — FIBRINOGEN
Fibrinogen: 212 mg/dL (ref 210–475)
Fibrinogen: 214 mg/dL (ref 210–475)

## 2018-09-11 LAB — HEPARIN LEVEL (UNFRACTIONATED)
Heparin Unfractionated: 0.82 IU/mL — ABNORMAL HIGH (ref 0.30–0.70)
Heparin Unfractionated: 0.7 IU/mL (ref 0.30–0.70)

## 2018-09-11 LAB — HIV ANTIBODY (ROUTINE TESTING W REFLEX): HIV Screen 4th Generation wRfx: NONREACTIVE

## 2018-09-11 SURGERY — VENOGRAM
Anesthesia: General

## 2018-09-11 MED ORDER — PROPOFOL 10 MG/ML IV BOLUS
INTRAVENOUS | Status: DC | PRN
  Administered 2018-09-11: 160 mg via INTRAVENOUS

## 2018-09-11 MED ORDER — IODIXANOL 320 MG/ML IV SOLN
INTRAVENOUS | Status: DC | PRN
  Administered 2018-09-11 (×2): 50 mL via INTRAVENOUS

## 2018-09-11 MED ORDER — SODIUM CHLORIDE 0.9 % IV SOLN
INTRAVENOUS | Status: DC | PRN
  Administered 2018-09-11: 500 mL

## 2018-09-11 MED ORDER — DEXAMETHASONE SODIUM PHOSPHATE 4 MG/ML IJ SOLN
INTRAMUSCULAR | Status: DC | PRN
  Administered 2018-09-11: 8 mg via INTRAVENOUS

## 2018-09-11 MED ORDER — CEFAZOLIN SODIUM 1 G IJ SOLR
INTRAMUSCULAR | Status: DC | PRN
  Administered 2018-09-11: 2 g via INTRAMUSCULAR

## 2018-09-11 MED ORDER — PHENYLEPHRINE 40 MCG/ML (10ML) SYRINGE FOR IV PUSH (FOR BLOOD PRESSURE SUPPORT)
PREFILLED_SYRINGE | INTRAVENOUS | Status: DC | PRN
  Administered 2018-09-11 (×4): 80 ug via INTRAVENOUS

## 2018-09-11 MED ORDER — LIDOCAINE HCL (CARDIAC) PF 100 MG/5ML IV SOSY
PREFILLED_SYRINGE | INTRAVENOUS | Status: DC | PRN
  Administered 2018-09-11: 50 mg via INTRAVENOUS

## 2018-09-11 MED ORDER — ROCURONIUM BROMIDE 100 MG/10ML IV SOLN
INTRAVENOUS | Status: DC | PRN
  Administered 2018-09-11: 50 mg via INTRAVENOUS

## 2018-09-11 MED ORDER — LACTATED RINGERS IV SOLN
INTRAVENOUS | Status: DC
  Administered 2018-09-11: 07:00:00 via INTRAVENOUS

## 2018-09-11 MED ORDER — PROMETHAZINE HCL 25 MG/ML IJ SOLN: 6.2500 mg | INTRAMUSCULAR | Status: DC | PRN

## 2018-09-11 MED ORDER — FENTANYL CITRATE (PF) 100 MCG/2ML IJ SOLN
INTRAMUSCULAR | Status: DC | PRN
  Administered 2018-09-11: 75 ug via INTRAVENOUS

## 2018-09-11 MED ORDER — HEPARIN SODIUM (PORCINE) 1000 UNIT/ML IJ SOLN
INTRAMUSCULAR | Status: DC | PRN
  Administered 2018-09-11: 5000 [IU] via INTRAVENOUS

## 2018-09-11 MED ORDER — SUGAMMADEX SODIUM 200 MG/2ML IV SOLN
INTRAVENOUS | Status: DC | PRN
  Administered 2018-09-11: 150 mg via INTRAVENOUS

## 2018-09-11 MED ORDER — 0.9 % SODIUM CHLORIDE (POUR BTL) OPTIME
TOPICAL | Status: DC | PRN
  Administered 2018-09-11: 1000 mL

## 2018-09-11 MED ORDER — HYDROMORPHONE HCL 1 MG/ML IJ SOLN: 0.2500 mg | INTRAMUSCULAR | Status: DC | PRN

## 2018-09-11 MED ORDER — MIDAZOLAM HCL 5 MG/5ML IJ SOLN
INTRAMUSCULAR | Status: DC | PRN
  Administered 2018-09-11 (×2): 1 mg via INTRAVENOUS

## 2018-09-11 MED ORDER — SODIUM CHLORIDE 0.9 % IV SOLN
INTRAVENOUS | Status: AC
  Filled 2018-09-11: qty 1.2

## 2018-09-11 MED ORDER — LACTATED RINGERS IV SOLN: INTRAVENOUS | Status: DC

## 2018-09-11 MED ORDER — MEPERIDINE HCL 50 MG/ML IJ SOLN: 6.2500 mg | INTRAMUSCULAR | Status: DC | PRN

## 2018-09-11 MED ORDER — GLYCOPYRROLATE PF 0.2 MG/ML IJ SOSY
PREFILLED_SYRINGE | INTRAMUSCULAR | Status: DC | PRN
  Administered 2018-09-11: .2 mg via INTRAVENOUS

## 2018-09-11 MED ORDER — SODIUM CHLORIDE 0.9 % IV SOLN
INTRAVENOUS | Status: DC | PRN
  Administered 2018-09-11: 50 ug/min via INTRAVENOUS

## 2018-09-11 SURGICAL SUPPLY — 85 items
BAG BANDED W/RUBBER/TAPE 36X54 (MISCELLANEOUS) ×3 IMPLANT
BAG SNAP BAND KOVER 36X36 (MISCELLANEOUS) ×3 IMPLANT
BALLN MUSTANG 12X60X75 (BALLOONS) ×3
BALLN MUSTANG 8X60X75 (BALLOONS) ×3
BALLOON MUSTANG 12X60X75 (BALLOONS) ×2 IMPLANT
BALLOON MUSTANG 8X60X75 (BALLOONS) ×2 IMPLANT
BANDAGE ACE 4X5 VEL STRL LF (GAUZE/BANDAGES/DRESSINGS) IMPLANT
BANDAGE ELASTIC 4 VELCRO ST LF (GAUZE/BANDAGES/DRESSINGS) ×3 IMPLANT
BANDAGE ESMARK 6X9 LF (GAUZE/BANDAGES/DRESSINGS) IMPLANT
BNDG ESMARK 6X9 LF (GAUZE/BANDAGES/DRESSINGS)
BNDG GAUZE ELAST 4 BULKY (GAUZE/BANDAGES/DRESSINGS) ×3 IMPLANT
CANISTER SUCT 3000ML PPV (MISCELLANEOUS) ×3 IMPLANT
CATH VISIONS PV .035 IVUS (CATHETERS) ×3 IMPLANT
CLIP VESOCCLUDE MED 24/CT (CLIP) IMPLANT
CLIP VESOCCLUDE MED 6/CT (CLIP) IMPLANT
CLIP VESOCCLUDE SM WIDE 24/CT (CLIP) IMPLANT
CLIP VESOCCLUDE SM WIDE 6/CT (CLIP) IMPLANT
COVER DOME SNAP 22 D (MISCELLANEOUS) ×3 IMPLANT
COVER PROBE W GEL 5X96 (DRAPES) IMPLANT
COVER WAND RF STERILE (DRAPES) ×3 IMPLANT
CUFF TOURNIQUET SINGLE 24IN (TOURNIQUET CUFF) IMPLANT
CUFF TOURNIQUET SINGLE 34IN LL (TOURNIQUET CUFF) IMPLANT
CUFF TOURNIQUET SINGLE 44IN (TOURNIQUET CUFF) IMPLANT
DRAIN CHANNEL 15F RND FF W/TCR (WOUND CARE) IMPLANT
DRAPE C-ARM 42X72 X-RAY (DRAPES) IMPLANT
DRAPE FEMORAL ANGIO 80X135IN (DRAPES) ×3 IMPLANT
DRAPE HALF SHEET 40X57 (DRAPES) ×3 IMPLANT
ELECT REM PT RETURN 9FT ADLT (ELECTROSURGICAL) ×3
ELECTRODE REM PT RTRN 9FT ADLT (ELECTROSURGICAL) ×2 IMPLANT
EVACUATOR SILICONE 100CC (DRAIN) IMPLANT
GAUZE SPONGE 4X4 12PLY STRL LF (GAUZE/BANDAGES/DRESSINGS) ×3 IMPLANT
GLOVE BIO SURGEON STRL SZ7.5 (GLOVE) ×3 IMPLANT
GLOVE BIOGEL PI IND STRL 8 (GLOVE) ×2 IMPLANT
GLOVE BIOGEL PI INDICATOR 8 (GLOVE) ×1
GOWN STRL REUS W/ TWL LRG LVL3 (GOWN DISPOSABLE) ×6 IMPLANT
GOWN STRL REUS W/ TWL XL LVL3 (GOWN DISPOSABLE) ×2 IMPLANT
GOWN STRL REUS W/TWL LRG LVL3 (GOWN DISPOSABLE) ×3
GOWN STRL REUS W/TWL XL LVL3 (GOWN DISPOSABLE) ×1
HEMOSTAT SPONGE AVITENE ULTRA (HEMOSTASIS) IMPLANT
INSERT FOGARTY SM (MISCELLANEOUS) IMPLANT
KIT BASIN OR (CUSTOM PROCEDURE TRAY) ×3 IMPLANT
KIT ENCORE 26 ADVANTAGE (KITS) ×3 IMPLANT
KIT TURNOVER KIT B (KITS) ×3 IMPLANT
MARKER GRAFT CORONARY BYPASS (MISCELLANEOUS) IMPLANT
NS IRRIG 1000ML POUR BTL (IV SOLUTION) ×6 IMPLANT
PACK GENERAL/GYN (CUSTOM PROCEDURE TRAY) IMPLANT
PACK PERIPHERAL VASCULAR (CUSTOM PROCEDURE TRAY) IMPLANT
PACK UNIVERSAL I (CUSTOM PROCEDURE TRAY) ×3 IMPLANT
PAD ARMBOARD 7.5X6 YLW CONV (MISCELLANEOUS) ×6 IMPLANT
PROTECTION STATION PRESSURIZED (MISCELLANEOUS) ×3
SET MICROPUNCTURE 5F STIFF (MISCELLANEOUS) IMPLANT
SHEATH AVANTI 11CM 5FR (SHEATH) IMPLANT
SLEEVE ISOL F/PACE RF HD COVER (MISCELLANEOUS) ×3 IMPLANT
STAPLER VISISTAT 35W (STAPLE) IMPLANT
STATION PROTECTION PRESSURIZED (MISCELLANEOUS) ×2 IMPLANT
STENT VICI VENOUS 14X120 (Permanent Stent) ×3 IMPLANT
STENT VICI VENOUS 14X60 (Permanent Stent) ×3 IMPLANT
STOPCOCK 4 WAY LG BORE MALE ST (IV SETS) IMPLANT
STOPCOCK MORSE 400PSI 3WAY (MISCELLANEOUS) IMPLANT
SUT ETHILON 3 0 PS 1 (SUTURE) IMPLANT
SUT GORETEX 5 0 TT13 24 (SUTURE) IMPLANT
SUT GORETEX 6.0 TT13 (SUTURE) IMPLANT
SUT MNCRL AB 4-0 PS2 18 (SUTURE) ×3 IMPLANT
SUT PROLENE 5 0 C 1 24 (SUTURE) IMPLANT
SUT PROLENE 6 0 BV (SUTURE) IMPLANT
SUT PROLENE 7 0 BV 1 (SUTURE) IMPLANT
SUT SILK 2 0 PERMA HAND 18 BK (SUTURE) IMPLANT
SUT SILK 3 0 (SUTURE)
SUT SILK 3-0 18XBRD TIE 12 (SUTURE) IMPLANT
SUT VIC AB 2-0 CT1 27 (SUTURE)
SUT VIC AB 2-0 CT1 TAPERPNT 27 (SUTURE) IMPLANT
SUT VIC AB 3-0 SH 27 (SUTURE)
SUT VIC AB 3-0 SH 27X BRD (SUTURE) IMPLANT
SYR 10ML LL (SYRINGE) ×9 IMPLANT
SYR 20CC LL (SYRINGE) ×3 IMPLANT
SYR MEDRAD MARK V 150ML (SYRINGE) IMPLANT
TOWEL GREEN STERILE (TOWEL DISPOSABLE) ×3 IMPLANT
TRAY FOLEY MTR SLVR 16FR STAT (SET/KITS/TRAYS/PACK) IMPLANT
TUBING CIL FLEX 10 FLL-RA (TUBING) IMPLANT
TUBING EXTENTION W/L.L. (IV SETS) IMPLANT
TUBING HIGH PRESSURE 120CM (CONNECTOR) IMPLANT
UNDERPAD 30X30 (UNDERPADS AND DIAPERS) ×3 IMPLANT
WATER STERILE IRR 1000ML POUR (IV SOLUTION) ×3 IMPLANT
WIRE AMPLATZ SS-J .035X180CM (WIRE) ×3 IMPLANT
WIRE AMPLATZ SS-J .035X260CM (WIRE) ×3 IMPLANT

## 2018-09-11 NOTE — Progress Notes (Signed)
RN verified the presence of a signed informed consent that matches stated procedure by patient. Verified armband matches patient's stated name and birth date. Verified NPO status and that all jewelry, contact, glasses, dentures, and partials had been removed (if applicable).  

## 2018-09-11 NOTE — Anesthesia Postprocedure Evaluation (Signed)
Anesthesia Post Note  Patient: Thomas Huerta  Procedure(s) Performed: VENOGRAM LEFT LEG (Left ) INSERTION OF LEFT ILIAC STENT (Left ) INTRAVASCULAR ULTRASOUND/IVUS (N/A ) BALLOON ANGIOPLASTY LEFT ILIAC VEIN (Left )     Patient location during evaluation: PACU Anesthesia Type: General Level of consciousness: awake and alert Pain management: pain level controlled Vital Signs Assessment: post-procedure vital signs reviewed and stable Respiratory status: spontaneous breathing, nonlabored ventilation, respiratory function stable and patient connected to nasal cannula oxygen Cardiovascular status: blood pressure returned to baseline and stable Postop Assessment: no apparent nausea or vomiting Anesthetic complications: no    Last Vitals:  Vitals:   09/11/18 1045 09/11/18 1100  BP:  116/82  Pulse: (!) 54 (!) 52  Resp: 16 12  Temp:    SpO2: 99% 100%    Last Pain:  Vitals:   09/11/18 1045  TempSrc:   PainSc: 0-No pain                 Shelton Silvas

## 2018-09-11 NOTE — Op Note (Signed)
Date: September 11, 2018  Preoperative diagnosis: Left femoral and popliteal vein DVT  Postoperative diagnosis: Same  Procedure: 1.  Thrombolytics catheter check of the left lower extremity 2.  Intravascular ultrasound of the left popliteal, superficial femoral, common femoral, external iliac, common iliac vein, and vena cava 3.  Left common and external iliac angioplasty with stent placement (14 mm x 120 mm VICI and 14 mm x 60 VICI)  Surgeon: Dr. Cephus Shelling, MD  Indications: Patient is a 62 year old male who was evaluated by Dr. Darrick Penna for a left femoralpopliteal DVT with severe symptoms in his leg.  Yesterday he underwent left popliteal access in the prone position with penumbra thrombectomy and thrombolysis overnight.  He presents today for lytics check and possible iliac stent given a stenosis that was identified on IVUS yesterday.  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  We initially deployed a 14 mm x 120 mm VICI stent just past the left iliac bifurcation down into the left external iliac vein.  The stent was post angioplastied with a 12 mm Mustang balloon.  Venogram showed contrast hanging up in the distal external iliac vein right at the inguinal ligament and on IVUS there appeared to be a second severe narrowing here.  We separately extended our VICI stent down with a second 14 mm x 60 mm stent to just at the level of inguinal ligament given that angioplasty alone did not improve the results.  After this the patient had significant improvement with emptying of the left femoral vein and a widely patent left external and common iliac stent.  Anesthesia: General  Details: The patient was taken to the operating room after informed consent was obtained.  He was placed on operative table in prone position.  His left leg sheath and the back of his leg was prepped and draped in usual sterile fashion.  We initially  disconnected his TPA infusion and put a Amplatz wire up into his vena cava through the thrombolytic catheter.  The catheter was then removed and we obtained an injected arteriogram through the 9 French sheath in the popliteal vein and this showed significant improvement of lysis with no significant remnant thrombus.  We then performed IVUS that showed a greater than 70% stenosis of the common iliac vein that appeared stenotic down to the takeoff of the external iliac.  Using IVIS we selected a 14 mm VICI stent that was then deployed just across the iliac vein bifurcation by several millimeters and extended down the left external iliac vein.  This was predilated with an 8 mm Mustang and then postdilated with a 12 mm Mustang.  Another injection from the sheath showed contrast hanging up in the distal external iliac vein before contrast even got to the distal extent of her stent.  We looked at this again with IVIU and found a second stenosis in the external iliac vein that appeared to be the etiology for this.  As result we extended down with a second 40 mm x 60 mm VICI stent and then postdilated this with a 8 mm balloon.  Final injection showed widely patent femoropopliteal vein with no significant remnant thrombus and a widely patent common and external iliac stent and 0% residual stenosis in the stent.  Our sheath and wires were then removed pressure was held patient be taken the PACU on heparin.  Complications: None  Cephus Shelling, MD [facility] [place] Office: [phone] Pager: [place]

## 2018-09-11 NOTE — Progress Notes (Signed)
[facility] [place]  Subjective  - POD#1 s/p LLE lysis for fempop DVT.  States leg improved this am.   Objective (!) 120/96 (!) 52 97.6 F (36.4 C) (Oral) (!) 9 99%  Intake/Output Summary (Last 24 hours) at 09/11/2018 0701 Last data filed at 09/11/2018 0600 Gross per 24 hour  Intake 1616.04 ml  Output 2950 ml  Net -1333.96 ml    Sheath behind left knee, no hematoma Foot warm, swelling improved to left leg Motor and sensory intact  Laboratory Lab Results: Recent Labs    09/11/18 0053 09/11/18 0546  WBC 5.2 4.6  HGB 9.4* 9.4*  HCT 30.3* 29.3*  PLT 286 263   BMET Recent Labs    09/10/18 0814 09/11/18 0546  NA 142 139  K 3.5 3.8  CL 109 110  CO2  --  25  GLUCOSE 76 86  BUN 18 7  CREATININE 0.90 0.73  CALCIUM  --  7.5*    COAG No results found for: INR, PROTIME No results found for: PTT  Assessment/Planning:  Tolerated thrombolysis of left leg overnight for fem pop DVT.  States leg feels better.  Will perform lytics check today and evaluate with IVUS.  Cephus Shelling 09/11/2018 7:01 AM --

## 2018-09-11 NOTE — Anesthesia Procedure Notes (Signed)
Procedure Name: Intubation Date/Time: 09/11/2018 7:54 AM Performed by: Sonda Primes, CRNA Pre-anesthesia Checklist: Patient identified, Emergency Drugs available, Suction available and Patient being monitored Patient Re-evaluated:Patient Re-evaluated prior to induction Oxygen Delivery Method: Circle System Utilized Preoxygenation: Pre-oxygenation with 100% oxygen Induction Type: IV induction Ventilation: Mask ventilation without difficulty Laryngoscope Size: Glidescope and 3 Grade View: Grade I Tube type: Oral Tube size: 8.0 mm Number of attempts: 1 Airway Equipment and Method: Stylet,  Oral airway and Video-laryngoscopy Placement Confirmation: ETT inserted through vocal cords under direct vision,  positive ETCO2 and breath sounds checked- equal and bilateral Secured at: 22 cm Tube secured with: Tape Dental Injury: Teeth and Oropharynx as per pre-operative assessment

## 2018-09-11 NOTE — Transfer of Care (Signed)
Immediate Anesthesia Transfer of Care Note  Patient: Thomas Huerta  Procedure(s) Performed: VENOGRAM LEFT LEG (Left ) INSERTION OF LEFT ILIAC STENT (Left ) INTRAVASCULAR ULTRASOUND/IVUS (N/A ) BALLOON ANGIOPLASTY LEFT ILIAC VEIN (Left )  Patient Location: PACU  Anesthesia Type:General  Level of Consciousness: awake, alert , oriented and patient cooperative  Airway & Oxygen Therapy: Patient Spontanous Breathing  Post-op Assessment: Report given to RN and Post -op Vital signs reviewed and stable  Post vital signs: Reviewed and stable  Last Vitals: 127/80, 54, 16, 98% RA Vitals Value Taken Time  BP 127/80 09/11/2018 10:17 AM  Temp    Pulse 54 09/11/2018 10:19 AM  Resp 13 09/11/2018 10:19 AM  SpO2 100 % 09/11/2018 10:19 AM  Vitals shown include unvalidated device data.  Last Pain:  Vitals:   09/11/18 0522  TempSrc:   PainSc: 0-No pain      Patients Stated Pain Goal: 3 (09/10/18 1315)  Complications: No apparent anesthesia complications

## 2018-09-11 NOTE — Progress Notes (Signed)
ANTICOAGULATION CONSULT NOTE - Follow Up Consult  Pharmacy Consult for heparin  Indication: DVT  Allergies  Allergen Reactions  . Fentanyl Other (See Comments)    Sedated for days    Patient Measurements: Height: 5\' 9"  (175.3 cm) Weight: 150 lb (68 kg) IBW/kg (Calculated) : 70.7 Heparin Dosing Weight: 70kg  Vital Signs: Temp: 98.7 F (37.1 C) (10/26 1551) Temp Source: Oral (10/26 1551) BP: 119/90 (10/26 1800) Pulse Rate: 62 (10/26 1800)  Labs: Recent Labs    09/10/18 0814  09/10/18 1847 09/11/18 0053 09/11/18 0546 09/11/18 1754  HGB 10.9*   < > 8.4* 9.4* 9.4*  --   HCT 32.0*   < > 27.1* 30.3* 29.3*  --   PLT  --    < > 245 286 263  --   APTT  --    < > >200* 36 35 68*  HEPARINUNFRC  --    < > >2.20* 0.82* 0.70  --   CREATININE 0.90  --   --   --  0.73  --    < > = values in this interval not displayed.    Estimated Creatinine Clearance: 94.4 mL/min (by C-G formula based on SCr of 0.73 mg/dL).   Medical History: Past Medical History:  Diagnosis Date  . Back pain    chronic  . Complication of anesthesia    over medicated with Fentanyl  . History of kidney stones    5th time treating stones in 15 years  . Obesity    history of prior to gastric bypass   Assessment: 62 year old male with recent DVT, admitted to hospital for dvt expansion needing thrombolytic therapy. Patient has completed tpa protocol and s/p angioplasty and stent this am.   He was on apixaban as outpt, received 10/24pm. Heparin/tpa yesterday and new orders received this am to resume heparin and titrate to normal dvt goal.   Initial aptt on heparin 1100 units/hr is low therapeutic at 68.   Goal of Therapy:  Heparin level 0.3-0.7 units/ml aPTT 66-102 seconds Monitor platelets by anticoagulation protocol: Yes   Plan:  Increase heparin infusion slightly to 1150 units/hr Check 6 hr confirmatory aPTT Continue to monitor H&H and platelets  Vinnie Level, PharmD., BCPS Clinical  Pharmacist Clinical phone for 09/11/18 until 10pm: [phone]

## 2018-09-11 NOTE — Anesthesia Preprocedure Evaluation (Signed)
Anesthesia Evaluation  Patient identified by MRN, date of birth, ID band Patient awake    Reviewed: Allergy & Precautions, NPO status , Patient's Chart, lab work & pertinent test results  Airway Mallampati: III     Mouth opening: Limited Mouth Opening  Dental  (+) Teeth Intact, Dental Advisory Given   Pulmonary Current Smoker,    Pulmonary exam normal        Cardiovascular negative cardio ROS   Rhythm:Regular Rate:Normal     Neuro/Psych negative neurological ROS     GI/Hepatic negative GI ROS, Neg liver ROS,   Endo/Other  negative endocrine ROS  Renal/GU negative Renal ROS     Musculoskeletal negative musculoskeletal ROS (+)   Abdominal Normal abdominal exam  (+)   Peds  Hematology negative hematology ROS (+)   Anesthesia Other Findings   Reproductive/Obstetrics                             Anesthesia Physical Anesthesia Plan  ASA: III  Anesthesia Plan: General   Post-op Pain Management:    Induction: Intravenous  PONV Risk Score and Plan: 2 and Ondansetron, Dexamethasone and Midazolam  Airway Management Planned: Oral ETT and Video Laryngoscope Planned  Additional Equipment: None  Intra-op Plan:   Post-operative Plan: Extubation in OR  Informed Consent: I have reviewed the patients History and Physical, chart, labs and discussed the procedure including the risks, benefits and alternatives for the proposed anesthesia with the patient or authorized representative who has indicated his/her understanding and acceptance.   Dental advisory given  Plan Discussed with: CRNA  Anesthesia Plan Comments:         Anesthesia Quick Evaluation

## 2018-09-11 NOTE — Progress Notes (Signed)
ANTICOAGULATION CONSULT NOTE - Initial Consult  Pharmacy Consult for heparin  Indication: DVT  Allergies  Allergen Reactions  . Fentanyl Other (See Comments)    Sedated for days    Patient Measurements: Height: 5\' 9"  (175.3 cm) Weight: 150 lb (68 kg) IBW/kg (Calculated) : 70.7 Heparin Dosing Weight: 70kg  Vital Signs: Temp: 98.8 F (37.1 C) (10/26 1016) Temp Source: Oral (10/26 0400) BP: 116/82 (10/26 1100) Pulse Rate: 52 (10/26 1100)  Labs: Recent Labs    09/10/18 0814  09/10/18 1847 09/11/18 0053 09/11/18 0546  HGB 10.9*   < > 8.4* 9.4* 9.4*  HCT 32.0*   < > 27.1* 30.3* 29.3*  PLT  --    < > 245 286 263  APTT  --   --  >200* 36 35  HEPARINUNFRC  --    < > >2.20* 0.82* 0.70  CREATININE 0.90  --   --   --  0.73   < > = values in this interval not displayed.    Estimated Creatinine Clearance: 94.4 mL/min (by C-G formula based on SCr of 0.73 mg/dL).   Medical History: Past Medical History:  Diagnosis Date  . Back pain    chronic  . Complication of anesthesia    over medicated with Fentanyl  . History of kidney stones    5th time treating stones in 15 years  . Obesity    history of prior to gastric bypass   Assessment: 62 year old male with recent DVT, admitted to hospital for dvt expansion needing thrombolytic therapy. Patient has completed tpa protocol and s/p angioplasty and stent this am.   He was on apixaban as outpt, received 10/24pm. Heparin/tpa yesterday and new orders received this am to resume heparin and titrate to normal dvt goal.   Heparin levels not accurate d/t recent apixaban so we are monitoring aptts. CBC low but stable this morning.   Goal of Therapy:  Heparin level 0.3-0.7 units/ml aPTT 66-102 seconds Monitor platelets by anticoagulation protocol: Yes   Plan:  Increase heparin infusion to 1100 units/hr Check anti-Xa level in 6 hours and daily while on heparin Continue to monitor H&H and platelets  Sheppard Coil PharmD.,  BCPS Clinical Pharmacist 09/11/2018 11:47 AM

## 2018-09-11 NOTE — OR Nursing (Signed)
STACEY PERKINS RN HELD PRESSURE ON CATHETER SITE FOR 20 MIN.  NO HEMATOMA NOTED

## 2018-09-12 LAB — HEPARIN LEVEL (UNFRACTIONATED): Heparin Unfractionated: 0.43 IU/mL (ref 0.30–0.70)

## 2018-09-12 LAB — CBC
HCT: 28.8 % — ABNORMAL LOW (ref 39.0–52.0)
Hemoglobin: 9.2 g/dL — ABNORMAL LOW (ref 13.0–17.0)
MCH: 34.8 pg — ABNORMAL HIGH (ref 26.0–34.0)
MCHC: 31.9 g/dL (ref 30.0–36.0)
MCV: 109.1 fL — ABNORMAL HIGH (ref 80.0–100.0)
Platelets: 270 10*3/uL (ref 150–400)
RBC: 2.64 MIL/uL — ABNORMAL LOW (ref 4.22–5.81)
RDW: 13.8 % (ref 11.5–15.5)
WBC: 6.8 10*3/uL (ref 4.0–10.5)
nRBC: 0 % (ref 0.0–0.2)

## 2018-09-12 LAB — APTT: aPTT: 99 seconds — ABNORMAL HIGH (ref 24–36)

## 2018-09-12 MED ORDER — APIXABAN 5 MG PO TABS
5.0000 mg | ORAL_TABLET | Freq: Two times a day (BID) | ORAL | Status: DC
  Administered 2018-09-12: 5 mg via ORAL
  Filled 2018-09-12: qty 1

## 2018-09-12 MED ORDER — BUPRENORPHINE HCL-NALOXONE HCL 2-0.5 MG SL SUBL: 1.0000 | SUBLINGUAL_TABLET | Freq: Three times a day (TID) | SUBLINGUAL | 0 refills | Status: DC

## 2018-09-12 NOTE — Discharge Instructions (Signed)
° °  [facility] ° °Discharge Instructions ° °Lower Extremity Angiogram; Angioplasty/Stenting ° °Please refer to the following instructions for your post-procedure care. Your surgeon or physician assistant will discuss any changes with you. ° °Activity ° °Avoid lifting more than 8 pounds (1 gallons of milk) for 72 hours (3 days) after your procedure. You may walk as much as you can tolerate. It's OK to drive after 72 hours. ° °Bathing/Showering ° °You may shower the day after your procedure. If you have a bandage, you may remove it at 24- 48 hours. Clean your incision site with mild soap and water. Pat the area dry with a clean towel. ° °Diet ° °Resume your pre-procedure diet. There are no special food restrictions following this procedure. All patients with peripheral vascular disease should follow a low fat/low cholesterol diet. In order to heal from your surgery, it is CRITICAL to get adequate nutrition. Your body requires vitamins, minerals, and protein. Vegetables are the best source of vitamins and minerals. Vegetables also provide the perfect balance of protein. Processed food has little nutritional value, so try to avoid this. ° °Medications ° °Resume taking all of your medications unless your doctor tells you not to. If your incision is causing pain, you may take over-the-counter pain relievers such as acetaminophen (Tylenol) ° °Follow Up ° °Follow up will be arranged at the time of your procedure. You may have an office visit scheduled or may be scheduled for surgery. Ask your surgeon if you have any questions. ° °Please call us immediately for any of the following conditions: °•Severe or worsening pain your legs or feet at rest or with walking. °•Increased pain, redness, drainage at your groin puncture site. °•Fever of 101 degrees or higher. °•If you have any mild or slow bleeding from your puncture site: lie down, apply firm constant pressure over the area with a piece of  gauze or a clean wash cloth for 30 minutes- no peeking!, call 911 right away if you are still bleeding after 30 minutes, or if the bleeding is heavy and unmanageable. ° °Reduce your risk factors of vascular disease: ° °Stop smoking. If you would like help call [facility] at 1-800-QUIT-NOW (1-800-784-8669) or [place] at 336-586-4000. °Manage your cholesterol °Maintain a desired weight °Control your diabetes °Keep your blood pressure down ° °If you have any questions, please call the office at [phone] ° °

## 2018-09-12 NOTE — Progress Notes (Signed)
Discharge instructions reviewed with patient. All questions answered. PIV x 1 removed. Taken out in wheelchair by NT.

## 2018-09-12 NOTE — Discharge Summary (Signed)
Physician Discharge Summary   Patient ID: Thomas Huerta [id number] y.o. 1956-06-28  Admit date: 09/10/2018  Discharge date and time: 09/12/18   Admitting Physician: Sherren Kerns, MD   Discharge Physician: Dr. Chestine Spore  Admission Diagnoses: Leg DVT (deep venous thromboembolism), acute, left [facility]) [I82.402]  Discharge Diagnoses: same  Admission Condition: fair  Discharged Condition: fair  Indication for Admission: extensive LLE DVT  Hospital Course: Mr. Braxton is a 62y.o male who came in as an outpatient for LLE venous thrombolysis due to extensive LLE DVT.  Dr. Darrick Penna initiated lysis on 09/10/18 and post operatively patient was admitted to ICU.  He was brought back to the [facility] lab by Dr. Chestine Spore on 09/11/18 for recheck lysis and L iliac vein stent.  Post operatively he remained in the ICU on IV heparin.  POD #1 edema LLE improved.  Pharmacy transitioned the patient back to home dose of Eliquis.  Dr. Darrick Penna agreed to prescribe 10 days of buprenorphine at patient's current home dose for post op pain.  Patient is in the process of establishing with a new pain management physician.  He will follow up in office in 4 weeks with IVC/iliac venous duplex to see Dr. Darrick Penna.  Discharge instructions were reviewed with the patient.  He will be discharged this morning to home in stable condition.  Consults: None  Treatments: surgery: Venous thrombolysis by Dr. Darrick Penna with subsequent lysis and L iliac vein stent by Dr. Chestine Spore  Discharge Exam: see progress note 09/12/18 Vitals:   09/12/18 0600 09/12/18 0837  BP: 117/90   Pulse: (!) 53   Resp: 11   Temp:  98.5 F (36.9 C)  SpO2: 98%      Disposition: Discharge disposition: 01-Home or Self Care       Patient Instructions:  Allergies as of 09/12/2018      Reactions   Fentanyl Other (See Comments)   Sedated for days      Medication List    STOP taking these medications   Buprenorphine HCl-Naloxone HCl 2-0.5 MG  Film Replaced by:  buprenorphine-naloxone 2-0.5 mg Subl SL tablet     TAKE these medications   acetaminophen 500 MG tablet Commonly known as:  TYLENOL Take 1,000 mg by mouth every 6 (six) hours as needed (for pain.).   BELSOMRA 5 MG Tabs Generic drug:  Suvorexant Take 5 mg by mouth at bedtime as needed for sleep.   buprenorphine-naloxone 2-0.5 mg Subl SL tablet Commonly known as:  SUBOXONE Place 1 tablet under the tongue 3 (three) times daily. Replaces:  Buprenorphine HCl-Naloxone HCl 2-0.5 MG Film   ELIQUIS STARTER PACK 5 MG Tabs Take 5 mg by mouth 2 (two) times daily.   multivitamin with minerals tablet Take 1 tablet by mouth daily.   PROAIR HFA 108 (90 Base) MCG/ACT inhaler Generic drug:  albuterol Inhale 2 puffs into the lungs every 6 (six) hours as needed for wheezing or shortness of breath.   spironolactone 50 MG tablet Commonly known as:  ALDACTONE Take 50 mg by mouth daily.      Activity: activity as tolerated Diet: regular diet Wound Care: keep wound clean and dry  Follow-up with Dr. Darrick Penna in 4 weeks.  SignedEmilie Rutter 09/12/2018 9:51 AM

## 2018-09-12 NOTE — Progress Notes (Signed)
[facility]  Subjective  - Doing well.  Left leg swelling improved.   Objective 117/90 (!) 53 98.5 F (36.9 C) (Oral) 11 98%  Intake/Output Summary (Last 24 hours) at 09/12/2018 0944 Last data filed at 09/12/2018 0600 Gross per 24 hour  Intake 1170.39 ml  Output 1050 ml  Net 120.39 ml    No hematoma behind left knee Foot warm, swelling improved to left leg Motor and sensory intact  Laboratory Lab Results: Recent Labs    09/11/18 0546 09/12/18 0243  WBC 4.6 6.8  HGB 9.4* 9.2*  HCT 29.3* 28.8*  PLT 263 270   BMET Recent Labs    09/10/18 0814 09/11/18 0546  NA 142 139  K 3.5 3.8  CL 109 110  CO2  --  25  GLUCOSE 76 86  BUN 18 7  CREATININE 0.90 0.73  CALCIUM  --  7.5*    COAG No results found for: INR, PROTIME No results found for: PTT  Assessment/Planning:  Doing well s/p thrombolysis left leg DVT and iliac stent yesterday.  D/C home today.  Will restart Eliquis.  F/U one month with Dr. Darrick Penna with iliac venous duplex.  Cephus Shelling 09/12/2018 9:44 AM --

## 2018-09-12 NOTE — Progress Notes (Signed)
ANTICOAGULATION CONSULT NOTE - Follow Up Consult  Pharmacy Consult for Heparin  Indication: DVT  Allergies  Allergen Reactions  . Fentanyl Other (See Comments)    Sedated for days    Patient Measurements: Height: 5\' 9"  (175.3 cm) Weight: 150 lb (68 kg) IBW/kg (Calculated) : 70.7 Heparin Dosing Weight: 70kg  Vital Signs: Temp: 97.6 F (36.4 C) (10/27 0000) Temp Source: Oral (10/27 0000) BP: 119/84 (10/27 0300) Pulse Rate: 50 (10/27 0300)  Labs: Recent Labs    09/10/18 0814  09/11/18 0053 09/11/18 0546 09/11/18 1754 09/12/18 0243  HGB 10.9*   < > 9.4* 9.4*  --  9.2*  HCT 32.0*   < > 30.3* 29.3*  --  28.8*  PLT  --    < > 286 263  --  270  APTT  --    < > 36 35 68* 99*  HEPARINUNFRC  --    < > 0.82* 0.70  --  0.43  CREATININE 0.90  --   --  0.73  --   --    < > = values in this interval not displayed.    Estimated Creatinine Clearance: 94.4 mL/min (by C-G formula based on SCr of 0.73 mg/dL).   Medical History: Past Medical History:  Diagnosis Date  . Back pain    chronic  . Complication of anesthesia    over medicated with Fentanyl  . History of kidney stones    5th time treating stones in 15 years  . Obesity    history of prior to gastric bypass   Assessment: 62 year old male with recent DVT, admitted to hospital for dvt expansion needing thrombolytic therapy. Patient has completed tpa protocol and s/p angioplasty and stent this am.   He was on apixaban as outpt, received 10/24pm. Heparin/tpa yesterday and new orders received this am to resume heparin and titrate to normal dvt goal.   10/27 AM update: heparin level is therapeutic this AM and aPTT is therapeutic x 2, there doesn't appear to be any more apixaban influence on the heparin level, will DC aPTTs and use heparin level only to dose    Goal of Therapy:  Heparin level 0.3-0.7 units/mL Monitor platelets by anticoagulation protocol: Yes   Plan:  Cont heparin at 1150 units/hr Daily CBC/HL Monitor  for bleeding  Abran Duke, PharmD, BCPS Clinical Pharmacist Phone: [phone]

## 2018-09-13 ENCOUNTER — Telehealth: Payer: Self-pay | Admitting: Vascular Surgery

## 2018-09-13 ENCOUNTER — Encounter (HOSPITAL_COMMUNITY): Payer: Self-pay | Admitting: Vascular Surgery

## 2018-09-13 MED FILL — Lidocaine HCl Local Preservative Free (PF) Inj 1%: INTRAMUSCULAR | Qty: 30 | Status: AC

## 2018-09-13 NOTE — Telephone Encounter (Signed)
sch appt lvm [facility] ltr 10/21/18 8am IVC/iliac 9am p/o MD

## 2018-09-13 NOTE — Telephone Encounter (Signed)
-----   Message from Emilie Rutter, PA-C sent at 09/12/2018  9:50 AM EDT -----  Can you schedule an appt for this pt with Dr. Darrick Penna in 4 weeks with IVC/iliac venous duplex.  PO lytics and L iliac vein stent. Thanks, [employer]

## 2018-09-14 ENCOUNTER — Encounter: Payer: BLUE CROSS/BLUE SHIELD | Admitting: Thoracic Surgery (Cardiothoracic Vascular Surgery)

## 2018-09-14 ENCOUNTER — Encounter (HOSPITAL_COMMUNITY): Payer: Self-pay | Admitting: Vascular Surgery

## 2018-09-20 ENCOUNTER — Other Ambulatory Visit: Payer: Self-pay

## 2018-09-20 DIAGNOSIS — I824Z2 Acute embolism and thrombosis of unspecified deep veins of left distal lower extremity: Secondary | ICD-10-CM

## 2018-09-20 DIAGNOSIS — I82402 Acute embolism and thrombosis of unspecified deep veins of left lower extremity: Secondary | ICD-10-CM

## 2018-09-22 ENCOUNTER — Encounter: Payer: BLUE CROSS/BLUE SHIELD | Admitting: Cardiothoracic Surgery

## 2018-09-24 ENCOUNTER — Encounter: Payer: Self-pay | Admitting: Cardiothoracic Surgery

## 2018-10-03 ENCOUNTER — Other Ambulatory Visit: Payer: Self-pay

## 2018-10-03 ENCOUNTER — Emergency Department (HOSPITAL_COMMUNITY)
Admission: EM | Admit: 2018-10-03 | Discharge: 2018-10-03 | Disposition: A | Discharge status: Home / Self Care | Payer: BLUE CROSS/BLUE SHIELD | Attending: Emergency Medicine | Admitting: Emergency Medicine

## 2018-10-03 ENCOUNTER — Emergency Department (HOSPITAL_COMMUNITY): Payer: BLUE CROSS/BLUE SHIELD

## 2018-10-03 ENCOUNTER — Encounter (HOSPITAL_COMMUNITY): Payer: Self-pay | Admitting: Emergency Medicine

## 2018-10-03 DIAGNOSIS — S0990XA Unspecified injury of head, initial encounter: Secondary | ICD-10-CM | POA: Diagnosis present

## 2018-10-03 DIAGNOSIS — Y92009 Unspecified place in unspecified non-institutional (private) residence as the place of occurrence of the external cause: Secondary | ICD-10-CM | POA: Insufficient documentation

## 2018-10-03 DIAGNOSIS — R001 Bradycardia, unspecified: Secondary | ICD-10-CM | POA: Insufficient documentation

## 2018-10-03 DIAGNOSIS — S0081XA Abrasion of other part of head, initial encounter: Secondary | ICD-10-CM | POA: Diagnosis not present

## 2018-10-03 DIAGNOSIS — Y999 Unspecified external cause status: Secondary | ICD-10-CM | POA: Insufficient documentation

## 2018-10-03 DIAGNOSIS — F17210 Nicotine dependence, cigarettes, uncomplicated: Secondary | ICD-10-CM | POA: Diagnosis not present

## 2018-10-03 DIAGNOSIS — Y939 Activity, unspecified: Secondary | ICD-10-CM | POA: Insufficient documentation

## 2018-10-03 DIAGNOSIS — Z7901 Long term (current) use of anticoagulants: Secondary | ICD-10-CM | POA: Diagnosis not present

## 2018-10-03 DIAGNOSIS — Z79899 Other long term (current) drug therapy: Secondary | ICD-10-CM | POA: Diagnosis not present

## 2018-10-03 DIAGNOSIS — W010XXA Fall on same level from slipping, tripping and stumbling without subsequent striking against object, initial encounter: Secondary | ICD-10-CM | POA: Insufficient documentation

## 2018-10-03 DIAGNOSIS — S0502XA Injury of conjunctiva and corneal abrasion without foreign body, left eye, initial encounter: Secondary | ICD-10-CM | POA: Diagnosis not present

## 2018-10-03 MED ORDER — TETRACAINE HCL 0.5 % OP SOLN
1.0000 [drp] | Freq: Once | OPHTHALMIC | Status: AC
  Administered 2018-10-03: 1 [drp] via OPHTHALMIC
  Filled 2018-10-03: qty 4

## 2018-10-03 MED ORDER — FLUORESCEIN SODIUM 1 MG OP STRP
1.0000 | ORAL_STRIP | Freq: Once | OPHTHALMIC | Status: AC
  Administered 2018-10-03: 1 via OPHTHALMIC
  Filled 2018-10-03: qty 1

## 2018-10-03 MED ORDER — TOBRAMYCIN 0.3 % OP SOLN: 1.0000 [drp] | OPHTHALMIC | 0 refills | Status: DC

## 2018-10-03 NOTE — ED Notes (Signed)
ED Provider at bedside. 

## 2018-10-03 NOTE — ED Triage Notes (Signed)
Pt present to the ED with a swollen left eye with pain. Pt states he thought he had something in his eye. Pt also states he fell a day ago and that he is on Eliquis and Cymbalta.

## 2018-10-03 NOTE — ED Provider Notes (Addendum)
[facility] EMERGENCY DEPARTMENT Provider Note   CSN: [id number] Arrival date & time: 10/03/18  1157     History   Chief Complaint Chief Complaint  Patient presents with  . Eye Pain  . Fall    HPI Thomas Huerta is a 62 y.o. male.  Patient tripped and fell 1.5 days ago striking his forehead.  He did not suffer loss of consciousness.  He complains of periorbital swelling and pain, left side since the fall.  He denies any neck pain or other injury.  Yesterday he developed a foreign body sensation in his left eye approximately a day after the fall.  He admits to slight blurred vision and left eye pain which is worse with blinking his eyes and improved with keeping his eyes closed.  Other associated symptoms include photophobia  HPI  Past Medical History:  Diagnosis Date  . Back pain    chronic  . Complication of anesthesia    over medicated with Fentanyl  . History of kidney stones    5th time treating stones in 15 years  . Obesity    history of prior to gastric bypass    Patient Active Problem List   Diagnosis Date Noted  . Leg DVT (deep venous thromboembolism), acute, left (HCC) 09/10/2018    Past Surgical History:  Procedure Laterality Date  . ANGIOPLASTY ILLIAC ARTERY Left 09/11/2018   Procedure: BALLOON ANGIOPLASTY LEFT ILIAC VEIN;  Surgeon: Cephus Shelling, MD;  Location: [facility] OR;  Service: Vascular;  Laterality: Left;  . CYST EXCISION     left lower back beside the spine  . CYSTOSCOPY    . CYSTOSCOPY W/ URETERAL STENT REMOVAL Left 08/18/2017   Procedure: CYSTOSCOPY WITH STENT REMOVAL;  Surgeon: Orson Ape, MD;  Location: [facility] ORS;  Service: Urology;  Laterality: Left;  . CYSTOSCOPY WITH STENT PLACEMENT Left 06/16/2017   Procedure: CYSTOSCOPY WITH STENT PLACEMENT;  Surgeon: Orson Ape, MD;  Location: [facility] ORS;  Service: Urology;  Laterality: Left;  . EXTRACORPOREAL SHOCK WAVE LITHOTRIPSY Left 06/25/2017   Procedure:  EXTRACORPOREAL SHOCK WAVE LITHOTRIPSY (ESWL);  Surgeon: Orson Ape, MD;  Location: [facility] ORS;  Service: Urology;  Laterality: Left;  . EXTRACORPOREAL SHOCK WAVE LITHOTRIPSY Left 08/06/2017   Procedure: EXTRACORPOREAL SHOCK WAVE LITHOTRIPSY (ESWL);  Surgeon: Orson Ape, MD;  Location: [facility] ORS;  Service: Urology;  Laterality: Left;  . EXTRACORPOREAL SHOCK WAVE LITHOTRIPSY Right 11/05/2017   Procedure: EXTRACORPOREAL SHOCK WAVE LITHOTRIPSY (ESWL);  Surgeon: Orson Ape, MD;  Location: [facility] ORS;  Service: Urology;  Laterality: Right;  . GASTRIC BYPASS  2004  . HERNIA REPAIR Bilateral    inguinal hernias  . INSERTION OF ILIAC STENT Left 09/11/2018   Procedure: INSERTION OF LEFT ILIAC STENT;  Surgeon: Cephus Shelling, MD;  Location: [facility] OR;  Service: Vascular;  Laterality: Left;  . INTRAVASCULAR ULTRASOUND/IVUS N/A 09/11/2018   Procedure: INTRAVASCULAR ULTRASOUND/IVUS;  Surgeon: Cephus Shelling, MD;  Location: [facility] OR;  Service: Vascular;  Laterality: N/A;  . LOWER EXTREMITY VENOGRAPHY Left 09/10/2018   Procedure: LOWER EXTREMITY VENOGRAPHY;  Surgeon: Sherren Kerns, MD;  Location: [facility] INVASIVE CV LAB;  Service: Cardiovascular;  Laterality: Left;  . PERIPHERAL VASCULAR THROMBECTOMY Left 09/10/2018   Procedure: PERIPHERAL VASCULAR THROMBECTOMY;  Surgeon: Sherren Kerns, MD;  Location: [facility] INVASIVE CV LAB;  Service: Cardiovascular;  Laterality: Left;  lower extremity venous system.  . TONSILLECTOMY     and addenoids  . TONSILLECTOMY AND ADENOIDECTOMY    .  URETEROSCOPY WITH HOLMIUM LASER LITHOTRIPSY Left 06/16/2017   Procedure: URETEROSCOPY WITH HOLMIUM LASER LITHOTRIPSY;  Surgeon: Orson Ape, MD;  Location: [facility] ORS;  Service: Urology;  Laterality: Left;  Marland Kitchen VENOGRAM Left 09/11/2018   Procedure: VENOGRAM LEFT LEG;  Surgeon: Cephus Shelling, MD;  Location: [facility] OR;  Service: Vascular;  Laterality: Left;        Home Medications    Prior to Admission medications     Medication Sig Start Date End Date Taking? Authorizing Provider  acetaminophen (TYLENOL) 500 MG tablet Take 1,000 mg by mouth every 6 (six) hours as needed (for pain.).    [provider]  BELSOMRA 5 MG TABS Take 5 mg by mouth at bedtime as needed for sleep. 08/23/18   [provider]  buprenorphine-naloxone (SUBOXONE) 2-0.5 mg SUBL SL tablet Place 1 tablet under the tongue 3 (three) times daily. 09/12/18   Emilie Rutter, PA-C  ELIQUIS STARTER PACK (ELIQUIS STARTER PACK) 5 MG TABS Take 5 mg by mouth 2 (two) times daily. 08/24/18   [provider]  Multiple Vitamins-Minerals (MULTIVITAMIN WITH MINERALS) tablet Take 1 tablet by mouth daily.    [provider]  PROAIR HFA 108 [phone] Base) MCG/ACT inhaler Inhale 2 puffs into the lungs every 6 (six) hours as needed for wheezing or shortness of breath. 08/20/18   [provider]  spironolactone (ALDACTONE) 50 MG tablet Take 50 mg by mouth daily. 08/18/18   [provider]    Family History Family History  Problem Relation Age of Onset  . Lymphoma Mother   . Colon cancer Father     Social History Social History   Tobacco Use  . Smoking status: Current Every Day Smoker    Packs/day: 1.00    Types: Cigarettes  . Smokeless tobacco: Never Used  Substance Use Topics  . Alcohol use: No  . Drug use: Yes    Types: Oxycodone    Comment: past history age 44 with perscription Percocet     Allergies   Fentanyl   Review of Systems Review of Systems  Eyes: Positive for pain and visual disturbance.  Allergic/Immunologic: Negative.        Tetanus immunization current  Neurological: Positive for headaches.       Post traumatic headache  Hematological: Bruises/bleeds easily.  All other systems reviewed and are negative.    Physical Exam Updated Vital Signs Ht 5\' 9"  (1.753 m)   Wt 68 kg   BMI 22.15 kg/m   Physical Exam  Constitutional: He is oriented to person, place, and time. He  appears well-developed and well-nourished.  HENT:  Right Ear: External ear normal.  Left Ear: External ear normal.  Mouth/Throat: Oropharynx is clear and moist.  Tiny abrasion above right eyebrow with minimal soft tissue swelling at right supraorbital area.  Otherwise normocephalic atraumatic.  Eyelids to left eye are slightly swollen.  Eye is not swollen shut  Eyes: Pupils are equal, round, and reactive to light. Conjunctivae are normal.  Left eye with some conjunctival erythema.  After fluorescein strip testing.  Has fluorescein uptake at 1:00 overlying the iris, not involving the visual axis consistent with corneal abrasion.  There is no corneal ulcer.  Right eye is normal.  No foreign body detected on double lid eversion  Neck: Neck supple. No tracheal deviation present. No thyromegaly present.  Cardiovascular: Regular rhythm.  No murmur heard. Mildly bradycardic  Pulmonary/Chest: Effort normal and breath sounds normal.  Abdominal: Soft. Bowel sounds are normal.  He exhibits no distension. There is no tenderness.  Musculoskeletal: Normal range of motion. He exhibits no edema or tenderness.  Neurological: He is alert and oriented to person, place, and time. Coordination normal.  gaitnormal moves all extremities well cranial nerves II through XII grossly intact  Skin: Skin is warm and dry. No rash noted.  Psychiatric: He has a normal mood and affect. His behavior is normal.  Nursing note and vitals reviewed.    ED Treatments / Results  Labs (all labs ordered are listed, but only abnormal results are displayed) Labs Reviewed - No data to display  EKG None  Radiology No results found.  Procedures Procedures (including critical care time)  Medications Ordered in ED Medications  tetracaine (PONTOCAINE) 0.5 % ophthalmic solution 1 drop (1 drop Left Eye Given by Other 10/03/18 1210)  fluorescein ophthalmic strip 1 strip (1 strip Left Eye Given by Other 10/03/18 1210)   Results  for orders placed or performed during the hospital encounter of 09/10/18  MRSA PCR Screening  Result Value Ref Range   MRSA by PCR NEGATIVE NEGATIVE  Heparin level (unfractionated) every 6 hours x 4 post-procedure  Result Value Ref Range   Heparin Unfractionated 1.62 (H) 0.30 - 0.70 IU/mL  Heparin level (unfractionated) every 6 hours x 4 post-procedure  Result Value Ref Range   Heparin Unfractionated >2.20 (H) 0.30 - 0.70 IU/mL  Heparin level (unfractionated) every 6 hours x 4 post-procedure  Result Value Ref Range   Heparin Unfractionated 0.82 (H) 0.30 - 0.70 IU/mL  CBC every 6 hours x 4 post-procedure  Result Value Ref Range   WBC 5.0 4.0 - 10.5 K/uL   RBC 2.82 (L) 4.22 - 5.81 MIL/uL   Hemoglobin 9.9 (L) 13.0 - 17.0 g/dL   HCT 40.931.1 (L) 81.139.0 - 91.452.0 %   MCV 110.3 (H) 80.0 - 100.0 fL   MCH 35.1 (H) 26.0 - 34.0 pg   MCHC 31.8 30.0 - 36.0 g/dL   RDW 78.214.6 95.611.5 - 21.315.5 %   Platelets 309 150 - 400 K/uL   nRBC 0.0 0.0 - 0.2 %  CBC every 6 hours x 4 post-procedure  Result Value Ref Range   WBC 4.2 4.0 - 10.5 K/uL   RBC 2.42 (L) 4.22 - 5.81 MIL/uL   Hemoglobin 8.4 (L) 13.0 - 17.0 g/dL   HCT 08.627.1 (L) 57.839.0 - 46.952.0 %   MCV 112.0 (H) 80.0 - 100.0 fL   MCH 34.7 (H) 26.0 - 34.0 pg   MCHC 31.0 30.0 - 36.0 g/dL   RDW 62.914.6 52.811.5 - 41.315.5 %   Platelets 245 150 - 400 K/uL   nRBC 0.0 0.0 - 0.2 %  CBC every 6 hours x 4 post-procedure  Result Value Ref Range   WBC 5.2 4.0 - 10.5 K/uL   RBC 2.74 (L) 4.22 - 5.81 MIL/uL   Hemoglobin 9.4 (L) 13.0 - 17.0 g/dL   HCT 24.430.3 (L) 01.039.0 - 27.252.0 %   MCV 110.6 (H) 80.0 - 100.0 fL   MCH 34.3 (H) 26.0 - 34.0 pg   MCHC 31.0 30.0 - 36.0 g/dL   RDW 53.614.6 64.411.5 - 03.415.5 %   Platelets 286 150 - 400 K/uL   nRBC 0.0 0.0 - 0.2 %  Fibrinogen every 6 hours x 4 post-procedure  Result Value Ref Range   Fibrinogen 256 210 - 475 mg/dL  Fibrinogen every 6 hours x 4 post-procedure  Result Value Ref Range   Fibrinogen 172 (L) 210 - 475 mg/dL  Fibrinogen every 6 hours x 4  post-procedure  Result Value Ref Range   Fibrinogen 212 210 - 475 mg/dL  HIV antibody (Routine Testing)  Result Value Ref Range   HIV Screen 4th Generation wRfx Non Reactive Non Reactive  APTT  Result Value Ref Range   aPTT >200 (HH) 24 - 36 seconds  Basic metabolic panel  Result Value Ref Range   Sodium 139 135 - 145 mmol/L   Potassium 3.8 3.5 - 5.1 mmol/L   Chloride 110 98 - 111 mmol/L   CO2 25 22 - 32 mmol/L   Glucose, Bld 86 70 - 99 mg/dL   BUN 7 6 - 20 mg/dL   Creatinine, Ser 1.47 0.61 - 1.24 mg/dL   Calcium 7.5 (L) 8.9 - 10.3 mg/dL   GFR calc non Af Amer >60 >60 mL/min   GFR calc Af Amer >60 >60 mL/min   Anion gap 4 (L) 5 - 15  Heparin level (unfractionated) every 6 hours x 4 post-procedure  Result Value Ref Range   Heparin Unfractionated 0.70 0.30 - 0.70 IU/mL  CBC every 6 hours x 4 post-procedure  Result Value Ref Range   WBC 4.6 4.0 - 10.5 K/uL   RBC 2.66 (L) 4.22 - 5.81 MIL/uL   Hemoglobin 9.4 (L) 13.0 - 17.0 g/dL   HCT 82.9 (L) 56.2 - 13.0 %   MCV 110.2 (H) 80.0 - 100.0 fL   MCH 35.3 (H) 26.0 - 34.0 pg   MCHC 32.1 30.0 - 36.0 g/dL   RDW 86.5 78.4 - 69.6 %   Platelets 263 150 - 400 K/uL   nRBC 0.0 0.0 - 0.2 %  Fibrinogen every 6 hours x 4 post-procedure  Result Value Ref Range   Fibrinogen 214 210 - 475 mg/dL  APTT  Result Value Ref Range   aPTT 35 24 - 36 seconds  APTT  Result Value Ref Range   aPTT 36 24 - 36 seconds  APTT  Result Value Ref Range   aPTT 68 (H) 24 - 36 seconds  CBC  Result Value Ref Range   WBC 6.8 4.0 - 10.5 K/uL   RBC 2.64 (L) 4.22 - 5.81 MIL/uL   Hemoglobin 9.2 (L) 13.0 - 17.0 g/dL   HCT 29.5 (L) 28.4 - 13.2 %   MCV 109.1 (H) 80.0 - 100.0 fL   MCH 34.8 (H) 26.0 - 34.0 pg   MCHC 31.9 30.0 - 36.0 g/dL   RDW 44.0 10.2 - 72.5 %   Platelets 270 150 - 400 K/uL   nRBC 0.0 0.0 - 0.2 %  Heparin level (unfractionated)  Result Value Ref Range   Heparin Unfractionated 0.43 0.30 - 0.70 IU/mL  APTT  Result Value Ref Range   aPTT 99  (H) 24 - 36 seconds  I-STAT, chem 8  Result Value Ref Range   Sodium 142 135 - 145 mmol/L   Potassium 3.5 3.5 - 5.1 mmol/L   Chloride 109 98 - 111 mmol/L   BUN 18 6 - 20 mg/dL   Creatinine, Ser 3.66 0.61 - 1.24 mg/dL   Glucose, Bld 76 70 - 99 mg/dL   Calcium, Ion 4.40 (L) 1.15 - 1.40 mmol/L   TCO2 26 22 - 32 mmol/L   Hemoglobin 10.9 (L) 13.0 - 17.0 g/dL   HCT 34.7 (L) 42.5 - 95.6 %   Ct Head Wo Contrast  Result Date: 10/03/2018 CLINICAL DATA:  Recent fall with headaches, initial encounter EXAM: CT HEAD WITHOUT CONTRAST TECHNIQUE: Contiguous axial  images were obtained from the base of the skull through the vertex without intravenous contrast. COMPARISON:  None. FINDINGS: Brain: No evidence of acute infarction, hemorrhage, hydrocephalus, extra-axial collection or mass lesion/mass effect. Vascular: No hyperdense vessel or unexpected calcification. Skull: Normal. Negative for fracture or focal lesion. Sinuses/Orbits: No acute finding. Other: None. IMPRESSION: No acute intracranial abnormality noted. Electronically Signed   By: Alcide Clever M.D.   On: 10/03/2018 13:09   Vas [place] Ivc/iliac (venous Only)  Result Date: 09/08/2018 IVC/ILIAC STUDY Risk Factors: Current smoker. Other Factors: Patient has known left leg DVT. and left leg edema. Limitations: Air/bowel gas.  Performing Technologist: Hardie Lora RVT  Examination Guidelines: A complete evaluation includes B-mode imaging, spectral Doppler, color Doppler, and power Doppler as needed of all accessible portions of each vessel. Bilateral testing is considered an integral part of a complete examination. Limited examinations for reoccurring indications may be performed as noted.  IVC/Iliac Findings: +----------+------+--------+-------+           PatentThrombusComment +----------+------+--------+-------+ IVC Mid   patent                +----------+------+--------+-------+ IVC Distalpatent                 +----------+------+--------+-------+  +-------------------+---------+-----------+---------+-----------+                    RT-PatentRT-ThrombusLT-PatentLT-Thrombus +-------------------+---------+-----------+---------+-----------+ Common Iliac Prox   patent              patent              +-------------------+---------+-----------+---------+-----------+ Common Iliac Mid    patent              patent              +-------------------+---------+-----------+---------+-----------+ Common Iliac Distal patent              patent              +-------------------+---------+-----------+---------+-----------+ Patent iliac veins bilaterally. +----------+---------+-----------+---------+-----------+           RT-PatentRT-ThrombusLT-PatentLT-Thrombus +----------+---------+-----------+---------+-----------+ EIV Prox   patent              patent              +----------+---------+-----------+---------+-----------+ EIV Mid    patent              patent              +----------+---------+-----------+---------+-----------+ EIV Distal patent              patent              +----------+---------+-----------+---------+-----------+ Patent iliac veins bilaterally. Limited examination of the left lower extremity showed occlusive DVT in the femoral vein with the most proximal edge extending in to the distal common femoral vein. The distal left common femoral vein is partially compressible.  Summary: IVC/Iliac: No evidence of thrombus in the IVC or iliac veins.  *See table(s) above for measurements and observations.  Electronically signed by Fabienne Bruns MD on 09/08/2018 at 3:58:03 PM.   Final     Initial Impression / Assessment and Plan / ED Course  I have reviewed the triage vital signs and the nursing notes.  Pertinent labs & imaging results that were available during my care of the patient were reviewed by me and considered in my medical decision making (see chart for details).      1:15 PM patient resting comfortably.  Plan prescription Tobrex ophthalmic drops.  Referral Dr. Cathey Endow, ophthalmology as needed  3-4 days.  Final Clinical Impressions(s) / ED Diagnoses   #1 fall #2 Minor closed head trauma #3 corneal abrasion of left eye Final diagnoses:  None   #4 forehead abrasion ED Discharge Orders    None       Doug Sou, MD 10/03/18 1318    Doug Sou, MD 10/03/18 1319

## 2018-10-03 NOTE — Discharge Instructions (Addendum)
Call Dr. Cathey EndowBowen to schedule an office visit if you continue to have eye pain in the next 3 to 4 days.  Use the antibiotic drop as prescribed

## 2018-10-21 ENCOUNTER — Ambulatory Visit (INDEPENDENT_AMBULATORY_CARE_PROVIDER_SITE_OTHER): Payer: BLUE CROSS/BLUE SHIELD | Admitting: Vascular Surgery

## 2018-10-21 ENCOUNTER — Encounter: Payer: Self-pay | Admitting: Vascular Surgery

## 2018-10-21 ENCOUNTER — Ambulatory Visit (HOSPITAL_COMMUNITY)
Admission: RE | Admit: 2018-10-21 | Discharge: 2018-10-21 | Disposition: A | Discharge status: Home / Self Care | Payer: BLUE CROSS/BLUE SHIELD | Source: Ambulatory Visit | Attending: Vascular Surgery | Admitting: Vascular Surgery

## 2018-10-21 ENCOUNTER — Other Ambulatory Visit: Payer: Self-pay

## 2018-10-21 VITALS — BP 120/88 | HR 59 | Temp 97.9°F | Resp 20 | Ht 69.0 in | Wt 158.0 lb

## 2018-10-21 DIAGNOSIS — I82402 Acute embolism and thrombosis of unspecified deep veins of left lower extremity: Secondary | ICD-10-CM | POA: Diagnosis not present

## 2018-10-21 DIAGNOSIS — I824Z2 Acute embolism and thrombosis of unspecified deep veins of left distal lower extremity: Secondary | ICD-10-CM | POA: Diagnosis not present

## 2018-10-21 DIAGNOSIS — I868 Varicose veins of other specified sites: Secondary | ICD-10-CM

## 2018-10-21 NOTE — Progress Notes (Signed)
Patient is a 62 year old male who returns for postoperative follow-up today.  He recently underwent thrombolysis of the left venous system load subsequently by left common and external iliac venous angioplasty and stenting.  He was stented from the left inguinal ligament to just above the iliac bifurcation.  He reports that his swelling in his left leg has improved considerably.  He still notes some swelling in his ankles if he is up on his feet all day.  This resolves mostly with leg elevation.  He has not been wearing compression stockings.  He is currently on Eliquis.  Review of systems: He denies any shortness of breath or chest pain.  Current Outpatient Medications on File Prior to Visit  Medication Sig Dispense Refill  . acetaminophen (TYLENOL) 500 MG tablet Take 1,000 mg by mouth every 6 (six) hours as needed (for pain.).    Marland Kitchen. BELSOMRA 5 MG TABS Take 5 mg by mouth at bedtime as needed for sleep.  0  . buprenorphine-naloxone (SUBOXONE) 2-0.5 mg SUBL SL tablet Place 1 tablet under the tongue 3 (three) times daily. 30 tablet 0  . ELIQUIS STARTER PACK (ELIQUIS STARTER PACK) 5 MG TABS Take 5 mg by mouth 2 (two) times daily.  0  . Multiple Vitamins-Minerals (MULTIVITAMIN WITH MINERALS) tablet Take 1 tablet by mouth daily.    Marland Kitchen. PROAIR HFA 108 (90 Base) MCG/ACT inhaler Inhale 2 puffs into the lungs every 6 (six) hours as needed for wheezing or shortness of breath.  0  . spironolactone (ALDACTONE) 50 MG tablet Take 50 mg by mouth daily.  0  . tobramycin (TOBREX) 0.3 % ophthalmic solution Place 1 drop into the left eye every 4 (four) hours. 5 mL 0   No current facility-administered medications on file prior to visit.    Physical exam:  Vitals:   10/21/18 1118  BP: 120/88  Pulse: (!) 59  Resp: 20  Temp: 97.9 F (36.6 C)  SpO2: 99%  Weight: 158 lb (71.7 kg)  Height: 5\' 9"  (1.753 m)   Extremities: 2+ dorsalis pedis pulses bilaterally  Musculoskeletal trace edema bilaterally now symmetric.   His leg was about 20% larger on the left side preoperatively.  Data: Patient had a venous duplex of his inferior vena cava and iliac system today.  Showed wide patency of the left common iliac and external iliac artery stent the popliteal vein was noncompressible the femoral vein was compressible.  This represents an improvement from preoperatively.  Assessment: Patient improved left leg swelling status post left iliac venous stenting.  Plan: The patient will wear compression stockings during the day and was given a pair of these in our office today 20 to 30 mm knee-high compression stockings.  He will follow-up with a repeat duplex scan in April 2020.  At that time we will decide whether or not he needs further anticoagulation.  He will continue his Eliquis meantime.  Fabienne Brunsharles Temprance Wyre, MD [facility] Office: [phone] Pager: [phone]

## 2018-10-28 ENCOUNTER — Other Ambulatory Visit: Payer: Self-pay | Admitting: Nurse Practitioner

## 2018-10-28 DIAGNOSIS — R748 Abnormal levels of other serum enzymes: Secondary | ICD-10-CM

## 2018-11-11 ENCOUNTER — Encounter (HOSPITAL_COMMUNITY): Payer: Self-pay | Admitting: *Deleted

## 2018-11-11 ENCOUNTER — Other Ambulatory Visit: Payer: Self-pay

## 2018-11-11 ENCOUNTER — Ambulatory Visit (HOSPITAL_BASED_OUTPATIENT_CLINIC_OR_DEPARTMENT_OTHER): Payer: BLUE CROSS/BLUE SHIELD

## 2018-11-11 ENCOUNTER — Emergency Department (HOSPITAL_COMMUNITY)
Admission: EM | Admit: 2018-11-11 | Discharge: 2018-11-11 | Disposition: A | Discharge status: Home / Self Care | Payer: BLUE CROSS/BLUE SHIELD | Attending: Emergency Medicine | Admitting: Emergency Medicine

## 2018-11-11 ENCOUNTER — Emergency Department (HOSPITAL_COMMUNITY): Payer: BLUE CROSS/BLUE SHIELD

## 2018-11-11 DIAGNOSIS — Z86718 Personal history of other venous thrombosis and embolism: Secondary | ICD-10-CM | POA: Insufficient documentation

## 2018-11-11 DIAGNOSIS — M7989 Other specified soft tissue disorders: Secondary | ICD-10-CM

## 2018-11-11 DIAGNOSIS — Z87891 Personal history of nicotine dependence: Secondary | ICD-10-CM | POA: Diagnosis not present

## 2018-11-11 DIAGNOSIS — R609 Edema, unspecified: Secondary | ICD-10-CM

## 2018-11-11 DIAGNOSIS — Z7901 Long term (current) use of anticoagulants: Secondary | ICD-10-CM | POA: Insufficient documentation

## 2018-11-11 DIAGNOSIS — R600 Localized edema: Secondary | ICD-10-CM | POA: Diagnosis not present

## 2018-11-11 DIAGNOSIS — Z79899 Other long term (current) drug therapy: Secondary | ICD-10-CM | POA: Diagnosis not present

## 2018-11-11 DIAGNOSIS — R2243 Localized swelling, mass and lump, lower limb, bilateral: Secondary | ICD-10-CM | POA: Diagnosis present

## 2018-11-11 LAB — CBC WITH DIFFERENTIAL/PLATELET
Abs Immature Granulocytes: 0.02 10*3/uL (ref 0.00–0.07)
Basophils Absolute: 0 10*3/uL (ref 0.0–0.1)
Basophils Relative: 1 %
Eosinophils Absolute: 0 10*3/uL (ref 0.0–0.5)
Eosinophils Relative: 1 %
HCT: 40.6 % (ref 39.0–52.0)
Hemoglobin: 13.2 g/dL (ref 13.0–17.0)
Immature Granulocytes: 0 %
Lymphocytes Relative: 19 %
Lymphs Abs: 1.1 10*3/uL (ref 0.7–4.0)
MCH: 33.2 pg (ref 26.0–34.0)
MCHC: 32.5 g/dL (ref 30.0–36.0)
MCV: 102 fL — ABNORMAL HIGH (ref 80.0–100.0)
Monocytes Absolute: 0.3 10*3/uL (ref 0.1–1.0)
Monocytes Relative: 6 %
Neutro Abs: 4.3 10*3/uL (ref 1.7–7.7)
Neutrophils Relative %: 73 %
Platelets: 215 10*3/uL (ref 150–400)
RBC: 3.98 MIL/uL — ABNORMAL LOW (ref 4.22–5.81)
RDW: 13 % (ref 11.5–15.5)
WBC: 5.8 10*3/uL (ref 4.0–10.5)
nRBC: 0 % (ref 0.0–0.2)

## 2018-11-11 LAB — PROTIME-INR
INR: 1.2
Prothrombin Time: 15.1 seconds (ref 11.4–15.2)

## 2018-11-11 LAB — COMPREHENSIVE METABOLIC PANEL
ALT: 57 U/L — ABNORMAL HIGH (ref 0–44)
AST: 46 U/L — ABNORMAL HIGH (ref 15–41)
Albumin: 2.1 g/dL — ABNORMAL LOW (ref 3.5–5.0)
Alkaline Phosphatase: 135 U/L — ABNORMAL HIGH (ref 38–126)
Anion gap: 6 (ref 5–15)
BUN: 13 mg/dL (ref 6–20)
CO2: 23 mmol/L (ref 22–32)
Calcium: 7.7 mg/dL — ABNORMAL LOW (ref 8.9–10.3)
Chloride: 112 mmol/L — ABNORMAL HIGH (ref 98–111)
Creatinine, Ser: 1.17 mg/dL (ref 0.61–1.24)
GFR calc Af Amer: >60 mL/min (ref >60)
GFR calc non Af Amer: >60 mL/min (ref >60)
Glucose, Bld: 80 mg/dL (ref 70–99)
Potassium: 3.9 mmol/L (ref 3.5–5.1)
Sodium: 141 mmol/L (ref 135–145)
Total Bilirubin: 0.7 mg/dL (ref 0.3–1.2)
Total Protein: 4.2 g/dL — ABNORMAL LOW (ref 6.5–8.1)

## 2018-11-11 MED ORDER — FUROSEMIDE 20 MG PO TABS: 20.0000 mg | ORAL_TABLET | Freq: Every day | ORAL | 0 refills | Status: DC

## 2018-11-11 MED ORDER — RIVAROXABAN (XARELTO) VTE STARTER PACK (15 & 20 MG): ORAL_TABLET | ORAL | 0 refills | Status: DC

## 2018-11-11 NOTE — Consult Note (Addendum)
Hospital Consult    Reason for Consult:  Bilateral leg swelling, previous left iliac vein stent Referring Physician:  ED MRN #:  [id number]  History of Present Illness: This is a 62 y.o. 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  Patient was just seen by Dr. Darrick Penna 3 weeks ago and was doing well.  Reports bilateral leg and left arm swelling over past week since Monday.  Also progressive scrotal edema.  Has been taking Eliquis and states has not missed a dose.    Past Medical History:  Diagnosis Date  . Back pain    chronic  . Complication of anesthesia    over medicated with Fentanyl  . History of kidney stones    5th time treating stones in 15 years  . Obesity    history of prior to gastric bypass    Past Surgical History:  Procedure Laterality Date  . ANGIOPLASTY ILLIAC ARTERY Left 09/11/2018   Procedure: BALLOON ANGIOPLASTY LEFT ILIAC VEIN;  Surgeon: Cephus Shelling, MD;  Location: [facility] OR;  Service: Vascular;  Laterality: Left;  . CYST EXCISION     left lower back beside the spine  . CYSTOSCOPY    . CYSTOSCOPY W/ URETERAL STENT REMOVAL Left 08/18/2017   Procedure: CYSTOSCOPY WITH STENT REMOVAL;  Surgeon: Orson Ape, MD;  Location: [facility] ORS;  Service: Urology;  Laterality: Left;  . CYSTOSCOPY WITH STENT PLACEMENT Left 06/16/2017   Procedure: CYSTOSCOPY WITH STENT PLACEMENT;  Surgeon: Orson Ape, MD;  Location: [facility] ORS;  Service: Urology;  Laterality: Left;  . EXTRACORPOREAL SHOCK WAVE LITHOTRIPSY Left 06/25/2017   Procedure: EXTRACORPOREAL SHOCK WAVE LITHOTRIPSY (ESWL);  Surgeon: Orson Ape, MD;  Location: [facility] ORS;  Service: Urology;  Laterality: Left;  . EXTRACORPOREAL SHOCK WAVE LITHOTRIPSY Left 08/06/2017   Procedure: EXTRACORPOREAL SHOCK WAVE LITHOTRIPSY (ESWL);  Surgeon: Orson Ape, MD;  Location: [facility] ORS;   Service: Urology;  Laterality: Left;  . EXTRACORPOREAL SHOCK WAVE LITHOTRIPSY Right 11/05/2017   Procedure: EXTRACORPOREAL SHOCK WAVE LITHOTRIPSY (ESWL);  Surgeon: Orson Ape, MD;  Location: [facility] ORS;  Service: Urology;  Laterality: Right;  . GASTRIC BYPASS  2004  . HERNIA REPAIR Bilateral    inguinal hernias  . INSERTION OF ILIAC STENT Left 09/11/2018   Procedure: INSERTION OF LEFT ILIAC STENT;  Surgeon: Cephus Shelling, MD;  Location: [facility] OR;  Service: Vascular;  Laterality: Left;  . INTRAVASCULAR ULTRASOUND/IVUS N/A 09/11/2018   Procedure: INTRAVASCULAR ULTRASOUND/IVUS;  Surgeon: Cephus Shelling, MD;  Location: [facility] OR;  Service: Vascular;  Laterality: N/A;  . LOWER EXTREMITY VENOGRAPHY Left 09/10/2018   Procedure: LOWER EXTREMITY VENOGRAPHY;  Surgeon: Sherren Kerns, MD;  Location: [facility];  Service: Cardiovascular;  Laterality: Left;  . PERIPHERAL VASCULAR THROMBECTOMY Left 09/10/2018   Procedure: PERIPHERAL VASCULAR THROMBECTOMY;  Surgeon: Sherren Kerns, MD;  Location: [facility];  Service: Cardiovascular;  Laterality: Left;  lower extremity venous system.  . TONSILLECTOMY     and addenoids  . TONSILLECTOMY AND ADENOIDECTOMY    . URETEROSCOPY WITH HOLMIUM LASER LITHOTRIPSY Left 06/16/2017   Procedure: URETEROSCOPY WITH HOLMIUM LASER LITHOTRIPSY;  Surgeon: Orson Ape, MD;  Location: [facility] ORS;  Service: Urology;  Laterality: Left;  Marland Kitchen VENOGRAM Left 09/11/2018   Procedure: VENOGRAM LEFT LEG;  Surgeon: Cephus Shelling, MD;  Location: [facility] OR;  Service: Vascular;  Laterality: Left;    Allergies  Allergen Reactions  . Fentanyl Other (See Comments)    Sedated for days    Prior to Admission medications   Medication Sig Start Date End Date Taking? Authorizing Provider  acetaminophen (TYLENOL) 500 MG tablet Take 1,000 mg by mouth every 6 (six) hours as needed (for pain.).   Yes [provider]  BELSOMRA 5 MG TABS Take 5 mg by mouth at  bedtime as needed for sleep. 08/23/18  Yes [provider]  buprenorphine-naloxone (SUBOXONE) 2-0.5 mg SUBL SL tablet Place 1 tablet under the tongue 3 (three) times daily. 09/12/18  Yes Emilie Rutter, PA-C  ELIQUIS STARTER PACK (ELIQUIS STARTER PACK) 5 MG TABS Take 5 mg by mouth 2 (two) times daily. 08/24/18  Yes [provider]  Multiple Vitamins-Minerals (MULTIVITAMIN WITH MINERALS) tablet Take 1 tablet by mouth daily.   Yes [provider]  PROAIR HFA 108 (90 Base) MCG/ACT inhaler Inhale 2 puffs into the lungs every 6 (six) hours as needed for wheezing or shortness of breath. 08/20/18  Yes [provider]  spironolactone (ALDACTONE) 50 MG tablet Take 50 mg by mouth daily. 08/18/18  Yes [provider]  tobramycin (TOBREX) 0.3 % ophthalmic solution Place 1 drop into the left eye every 4 (four) hours. Patient not taking: Reported on 11/11/2018 10/03/18   Doug Sou, MD    Social History   Socioeconomic History  . Marital status: Married    Spouse name: Not on file  . Number of children: Not on file  . Years of education: Not on file  . Highest education level: Not on file  Occupational History  . Not on file  Social Needs  . Financial resource strain: Not on file  . Food insecurity:    Worry: Not on file    Inability: Not on file  . Transportation needs:    Medical: Not on file    Non-medical: Not on file  Tobacco Use  . Smoking status: Former Smoker    Packs/day: 1.00    Types: Cigarettes    Last attempt to quit: 11/01/2018    Years since quitting: 0.0  . Smokeless tobacco: Never Used  Substance and Sexual Activity  . Alcohol use: No  . Drug use: Yes    Types: Oxycodone    Comment: past history age 22 with perscription Percocet  . Sexual activity: Not on file  Lifestyle  . Physical activity:    Days per week: Not on file    Minutes per session: Not on file  . Stress: Not on file  Relationships  . Social connections:     Talks on phone: Not on file    Gets together: Not on file    Attends religious service: Not on file    Active member of club or organization: Not on file    Attends meetings of clubs or organizations: Not on file    Relationship status: Not on file  . Intimate partner violence:    Fear of current or ex partner: Not on file    Emotionally abused: Not on file    Physically abused: Not on file    Forced sexual activity: Not on file  Other Topics Concern  . Not on file  Social History Narrative  . Not on file     Family History  Problem Relation Age of Onset  . Lymphoma Mother   . Colon cancer Father     ROS: [x]  Positive   [ ]   Negative   [ ]  All sytems reviewed and are negative  Cardiovascular: []  chest pain/pressure []  palpitations []  SOB lying flat []  DOE []  pain in legs while walking []  pain in legs at rest []  pain in legs at night []  non-healing ulcers []  hx of DVT [x]  swelling in legs  Pulmonary: []  productive cough []  asthma/wheezing []  home O2  Neurologic: []  weakness in []  arms []  legs []  numbness in []  arms []  legs []  hx of CVA []  mini stroke [] difficulty speaking or slurred speech []  temporary loss of vision in one eye []  dizziness  Hematologic: []  hx of cancer []  bleeding problems []  problems with blood clotting easily  Endocrine:   []  diabetes []  thyroid disease  GI []  vomiting blood []  blood in stool  GU: []  CKD/renal failure []  HD--[]  M/W/F or []  T/T/S []  burning with urination []  blood in urine  Psychiatric: []  anxiety []  depression  Musculoskeletal: []  arthritis []  joint pain  Integumentary: []  rashes []  ulcers  Constitutional: []  fever []  chills   Physical Examination  Vitals:   11/11/18 1600 11/11/18 1700  BP: (!) 136/94 129/86  Pulse: (!) 54 (!) 58  Resp: 16 16  Temp:    SpO2: 99% 100%   There is no height or weight on file to calculate BMI.  General:  NAD Gait: Not observed HENT: WNL,  normocephalic Pulmonary: normal non-labored breathing, without Rales, rhonchi,  wheezing Cardiac: regular, without  Murmurs, rubs or gallops Abdomen: soft, NT/ND, no masses Skin: without rashes Vascular Exam/Pulses:  Right Left  Radial 2+ (normal) 2+ (normal)  Ulnar    Femoral 2+ (normal) 2+ (normal)  Popliteal    DP 2+ (normal) 2+ (normal)  PT 2+ (normal) 2+ (normal)   Extremities: without ischemic changes, without Gangrene , without cellulitis; without open wounds; no phlegmasia or discoloration to lower extremities 2+ edema of bilateral lower extremities Scrotal edema Left arm edema Musculoskeletal: no muscle wasting or atrophy  Neurologic: A&O X 3; Appropriate Affect ; SENSATION: normal; MOTOR FUNCTION:  moving all extremities equally. Speech is fluent/normal   CBC    Component Value Date/Time   WBC 5.8 11/11/2018 1445   RBC 3.98 (L) 11/11/2018 1445   HGB 13.2 11/11/2018 1445   HCT 40.6 11/11/2018 1445   PLT 215 11/11/2018 1445   MCV 102.0 (H) 11/11/2018 1445   MCH 33.2 11/11/2018 1445   MCHC 32.5 11/11/2018 1445   RDW 13.0 11/11/2018 1445   LYMPHSABS 1.1 11/11/2018 1445   MONOABS 0.3 11/11/2018 1445   EOSABS 0.0 11/11/2018 1445   BASOSABS 0.0 11/11/2018 1445    BMET    Component Value Date/Time   NA 141 11/11/2018 1445   K 3.9 11/11/2018 1445   CL 112 (H) 11/11/2018 1445   CO2 23 11/11/2018 1445   GLUCOSE 80 11/11/2018 1445   BUN 13 11/11/2018 1445   CREATININE 1.17 11/11/2018 1445   CALCIUM 7.7 (L) 11/11/2018 1445   GFRNONAA >60 11/11/2018 1445   GFRAA >60 11/11/2018 1445    COAGS: Lab Results  Component Value Date   INR 1.20 11/11/2018     Non-Invasive Vascular Imaging:    Reviewed iliac duplex and left iliac vein stents patent with no evidence of new lower extremity DVT.   ASSESSMENT/PLAN: This is a 62 y.o. male that presents for bilateral lower extremity swelling/scrotal swelling/left arm swelling after previous left leg venous intervention  including CIV/EIV stents.  Duplex today suggests left iliac vein stents are widely patent  with no thrombus.  No new lower extremity DVT.  Unclear etiology for lower extremity/scrotal swelling although albumin 2.1 and patient states losing weight.  Upper extremity duplex was concerning for early L IJ acute DVT - although I'm not sure the significance of this since the distal left IJ compressed normal and has normal waveforms and the concerning area of partial compression is just above the clavicle.  Would favor switching him to a different agent like Xarelto in the interim and repeat duplex in one week in our clinic.     Cephus Shellinghristopher J. Jaggar Benko, MD [facility] Office: [phone] Pager: [phone]

## 2018-11-11 NOTE — ED Notes (Signed)
Patient transported to X-ray 

## 2018-11-11 NOTE — ED Notes (Addendum)
Pt. Reports that Dr. Darrick PennaFields is on his way into see pt .

## 2018-11-11 NOTE — ED Notes (Signed)
Pt transported to vascular.  °

## 2018-11-11 NOTE — ED Provider Notes (Signed)
Emergency Department Provider Note   I have reviewed the triage vital signs and the nursing notes.   HISTORY  Chief Complaint No chief complaint on file.   HPI DEMARCUS Huerta is a 62 y.o. male with PMH of DVT requiring vein stenting and clot lysis with vascular surgery now on Eliquis presents to the emergency department for evaluation of worsening bilateral lower extremity swelling with pain in the left leg.  Patient has also developed left arm swelling.  Swelling is increased over the past 2 to 3 days.  He has been compliant with his Eliquis.  Denies any chest pain or shortness of breath.  No fevers or chills. Patient has also noted some scrotal swelling.    Past Medical History:  Diagnosis Date  . Back pain    chronic  . Complication of anesthesia    over medicated with Fentanyl  . History of kidney stones    5th time treating stones in 15 years  . Obesity    history of prior to gastric bypass    Patient Active Problem List   Diagnosis Date Noted  . Leg DVT (deep venous thromboembolism), acute, left (HCC) 09/10/2018    Past Surgical History:  Procedure Laterality Date  . ANGIOPLASTY ILLIAC ARTERY Left 09/11/2018   Procedure: BALLOON ANGIOPLASTY LEFT ILIAC VEIN;  Surgeon: Cephus Shelling, MD;  Location: [facility] OR;  Service: Vascular;  Laterality: Left;  . CYST EXCISION     left lower back beside the spine  . CYSTOSCOPY    . CYSTOSCOPY W/ URETERAL STENT REMOVAL Left 08/18/2017   Procedure: CYSTOSCOPY WITH STENT REMOVAL;  Surgeon: Orson Ape, MD;  Location: [facility] ORS;  Service: Urology;  Laterality: Left;  . CYSTOSCOPY WITH STENT PLACEMENT Left 06/16/2017   Procedure: CYSTOSCOPY WITH STENT PLACEMENT;  Surgeon: Orson Ape, MD;  Location: [facility] ORS;  Service: Urology;  Laterality: Left;  . EXTRACORPOREAL SHOCK WAVE LITHOTRIPSY Left 06/25/2017   Procedure: EXTRACORPOREAL SHOCK WAVE LITHOTRIPSY (ESWL);  Surgeon: Orson Ape, MD;  Location: [facility] ORS;   Service: Urology;  Laterality: Left;  . EXTRACORPOREAL SHOCK WAVE LITHOTRIPSY Left 08/06/2017   Procedure: EXTRACORPOREAL SHOCK WAVE LITHOTRIPSY (ESWL);  Surgeon: Orson Ape, MD;  Location: [facility] ORS;  Service: Urology;  Laterality: Left;  . EXTRACORPOREAL SHOCK WAVE LITHOTRIPSY Right 11/05/2017   Procedure: EXTRACORPOREAL SHOCK WAVE LITHOTRIPSY (ESWL);  Surgeon: Orson Ape, MD;  Location: [facility] ORS;  Service: Urology;  Laterality: Right;  . GASTRIC BYPASS  2004  . HERNIA REPAIR Bilateral    inguinal hernias  . INSERTION OF ILIAC STENT Left 09/11/2018   Procedure: INSERTION OF LEFT ILIAC STENT;  Surgeon: Cephus Shelling, MD;  Location: [facility] OR;  Service: Vascular;  Laterality: Left;  . INTRAVASCULAR ULTRASOUND/IVUS N/A 09/11/2018   Procedure: INTRAVASCULAR ULTRASOUND/IVUS;  Surgeon: Cephus Shelling, MD;  Location: [facility] OR;  Service: Vascular;  Laterality: N/A;  . LOWER EXTREMITY VENOGRAPHY Left 09/10/2018   Procedure: LOWER EXTREMITY VENOGRAPHY;  Surgeon: Sherren Kerns, MD;  Location: [facility] INVASIVE CV LAB;  Service: Cardiovascular;  Laterality: Left;  . PERIPHERAL VASCULAR THROMBECTOMY Left 09/10/2018   Procedure: PERIPHERAL VASCULAR THROMBECTOMY;  Surgeon: Sherren Kerns, MD;  Location: [facility] INVASIVE CV LAB;  Service: Cardiovascular;  Laterality: Left;  lower extremity venous system.  . TONSILLECTOMY     and addenoids  . TONSILLECTOMY AND ADENOIDECTOMY    . URETEROSCOPY WITH HOLMIUM LASER LITHOTRIPSY Left 06/16/2017   Procedure: URETEROSCOPY WITH HOLMIUM LASER LITHOTRIPSY;  Surgeon:  Orson ApeWolff, Michael R, MD;  Location: [facility] ORS;  Service: Urology;  Laterality: Left;  Marland Kitchen. VENOGRAM Left 09/11/2018   Procedure: VENOGRAM LEFT LEG;  Surgeon: Cephus Shellinglark, Christopher J, MD;  [facility];  Service: Vascular;  Laterality: Left;   Allergies Fentanyl  Family History  Problem Relation Age of Onset  . Lymphoma Mother   . Colon cancer Father     Social History Social History    Tobacco Use  . Smoking status: Former Smoker    Packs/day: 1.00    Types: Cigarettes    Last attempt to quit: 11/01/2018    Years since quitting: 0.0  . Smokeless tobacco: Never Used  Substance Use Topics  . Alcohol use: No  . Drug use: Yes    Types: Oxycodone    Comment: past history age 62 with perscription Percocet    Review of Systems  Constitutional: No fever/chills Eyes: No visual changes. ENT: No sore throat. Cardiovascular: Denies chest pain. Respiratory: Denies shortness of breath. Gastrointestinal: No abdominal pain.  No nausea, no vomiting.  No diarrhea.  No constipation. Genitourinary: Negative for dysuria. Positive scrotal edema.  Musculoskeletal: Positive Bilateral LE swelling and left arm swelling.  Skin: Negative for rash. Neurological: Negative for headaches, focal weakness or numbness.  10-point ROS otherwise negative.  ____________________________________________   PHYSICAL EXAM:  VITAL SIGNS: ED Triage Vitals  Enc Vitals Group     BP 11/11/18 1337 (!) 130/99     Pulse Rate 11/11/18 1337 (!) 58     Resp 11/11/18 1337 20     Temp 11/11/18 1337 97.6 F (36.4 C)     Temp Source 11/11/18 1337 Oral     SpO2 11/11/18 1337 97 %     Pain Score 11/11/18 1338 5   Constitutional: Alert and oriented. Well appearing and in no acute distress. Eyes: Conjunctivae are normal Head: Atraumatic. Nose: No congestion/rhinnorhea. Mouth/Throat: Mucous membranes are moist.  Neck: No stridor.   Cardiovascular: Normal rate, regular rhythm. Good peripheral circulation. Grossly normal heart sounds.   Respiratory: Normal respiratory effort.  No retractions. Lungs CTAB. Gastrointestinal: Soft and nontender. No distention.  Musculoskeletal: 3+ pitting edema in the bilateral LEs with 2+ pitting edema in the LUE. Normal ROM of all joints. No erythema or cellulitis.  Neurologic:  Normal speech and language. No gross focal neurologic deficits are appreciated.  Skin:  Skin  is warm, dry and intact. No rash noted.   ____________________________________________   LABS (all labs ordered are listed, but only abnormal results are displayed)  Labs Reviewed  COMPREHENSIVE METABOLIC PANEL - Abnormal; Notable for the following components:      Result Value   Chloride 112 (*)    Calcium 7.7 (*)    Total Protein 4.2 (*)    Albumin 2.1 (*)    AST 46 (*)    ALT 57 (*)    Alkaline Phosphatase 135 (*)    All other components within normal limits  CBC WITH DIFFERENTIAL/PLATELET - Abnormal; Notable for the following components:   RBC 3.98 (*)    MCV 102.0 (*)    All other components within normal limits  PROTIME-INR   ____________________________________________  EKG   EKG Interpretation  Date/Time:  Thursday November 11 2018 13:45:26 EST Ventricular Rate:  59 PR Interval:  126 QRS Duration: 92 QT Interval:  422 QTC Calculation: 417 R Axis:   66 Text Interpretation:  Sinus bradycardia Otherwise normal ECG No significant change since last tracing Confirmed by Tilden Fossaees, Elizabeth [phone]) on  11/11/2018 3:48:59 PM       ____________________________________________  RADIOLOGY  Dg Chest 2 View  Result Date: 11/11/2018 CLINICAL DATA:  History of deep venous thrombosis with thrombectomy EXAM: CHEST - 2 VIEW COMPARISON:  11/03/2018 FINDINGS: Cardiac shadows within normal limits. The lungs are well aerated bilaterally. No focal infiltrate is seen. Mild blunting of the costophrenic angles is noted. Bilateral nipple shadows are seen. The nodular density seen in the left lung base is not as well appreciated on today's exam. IMPRESSION: Small bilateral pleural effusions. The nodular density seen on the prior CT is not well appreciated on today's exam. Follow-up is recommended as previously described. Electronically Signed   By: Alcide Clever M.D.   On: 11/11/2018 19:30   Vas [place] Ivc/iliac (venous Only)  Result Date: 11/11/2018 IVC/ILIAC STUDY Indications: swelling in  left leg. Vascular Interventions: History of right external iliac vein stent and left                         common and external iliac veins stents.  Comparison Study: 10/21/18 patent IVC and bilateral iliac vein stents. Performing Technologist: Marilynne Halsted RDMS, RVT  Examination Guidelines: A complete evaluation includes B-mode imaging, spectral Doppler, color Doppler, and power Doppler as needed of all accessible portions of each vessel. Bilateral testing is considered an integral part of a complete examination. Limited examinations for reoccurring indications may be performed as noted.  IVC/Iliac Findings: +----------+------+--------+--------+    IVC    PatentThrombusComments +----------+------+--------+--------+ IVC Prox  patent                 +----------+------+--------+--------+ IVC Mid   patent                 +----------+------+--------+--------+ IVC Distalpatent                 +----------+------+--------+--------+  +-------------------+---------+-----------+---------+-----------+--------+         CIV        RT-PatentRT-ThrombusLT-PatentLT-ThrombusComments +-------------------+---------+-----------+---------+-----------+--------+ Common Iliac Prox   patent              patent              stent   +-------------------+---------+-----------+---------+-----------+--------+ Common Iliac Mid    patent              patent                      +-------------------+---------+-----------+---------+-----------+--------+ Common Iliac Distal patent              patent                      +-------------------+---------+-----------+---------+-----------+--------+ +-------------------------+---------+-----------+---------+-----------+--------+            EIV           RT-PatentRT-ThrombusLT-PatentLT-ThrombusComments +-------------------------+---------+-----------+---------+-----------+--------+ External Iliac Vein Prox  patent              patent               stent   +-------------------------+---------+-----------+---------+-----------+--------+ External Iliac Vein Mid   patent              patent                      +-------------------------+---------+-----------+---------+-----------+--------+ External Iliac Vein       patent              patent  Distal                                                                    +-------------------------+---------+-----------+---------+-----------+--------+  Summary: IVC/Iliac: No evidence of thrombus involving the IVC. Patent right common iliac vein and right external iliac vein stent. Patent left common and external iliac veins stents with no evidence of thrombus.  *See table(s) above for measurements and observations.  Electronically signed by Sherald Hess MD on 11/11/2018 at 5:36:36 PM.    Final    Vas [place] Lower Extremity Venous (dvt) (only Mc & Wl 7a-7p)  Result Date: 11/11/2018  Lower Venous Study Indications: Edema.  Risk Factors: DVT history of DVt and stent palcement. Performing Technologist: Toma Deiters RVS  Examination Guidelines: A complete evaluation includes B-mode imaging, spectral Doppler, color Doppler, and power Doppler as needed of all accessible portions of each vessel. Bilateral testing is considered an integral part of a complete examination. Limited examinations for reoccurring indications may be performed as noted.  Right Venous Findings: +---------+---------------+---------+-----------+----------+-------------------+          CompressibilityPhasicitySpontaneityPropertiesSummary             +---------+---------------+---------+-----------+----------+-------------------+ CFV      Full           Yes      Yes                                      +---------+---------------+---------+-----------+----------+-------------------+ SFJ      Full                                                              +---------+---------------+---------+-----------+----------+-------------------+ FV Prox  Full           Yes      Yes                                      +---------+---------------+---------+-----------+----------+-------------------+ FV Mid   Full                                                             +---------+---------------+---------+-----------+----------+-------------------+ FV DistalFull           Yes      Yes                                      +---------+---------------+---------+-----------+----------+-------------------+ PFV      Full           Yes      Yes                                      +---------+---------------+---------+-----------+----------+-------------------+  POP      Full           Yes      Yes                                      +---------+---------------+---------+-----------+----------+-------------------+ PTV      Full                                                             +---------+---------------+---------+-----------+----------+-------------------+ PERO                                                  Color flow appears                                                        normal difficult to                                                       evaluate                                                                  compression due to                                                        edema               +---------+---------------+---------+-----------+----------+-------------------+  Left Venous Findings: +---------+---------------+---------+-----------+----------+-------------------+          CompressibilityPhasicitySpontaneityPropertiesSummary             +---------+---------------+---------+-----------+----------+-------------------+ CFV      Full           Yes                                                +---------+---------------+---------+-----------+----------+-------------------+ SFJ      Full                                                             +---------+---------------+---------+-----------+----------+-------------------+ FV Prox  Full           Yes                                               +---------+---------------+---------+-----------+----------+-------------------+ FV Mid   Full                                                             +---------+---------------+---------+-----------+----------+-------------------+ FV DistalFull           Yes                                               +---------+---------------+---------+-----------+----------+-------------------+ PFV      Full           Yes                                               +---------+---------------+---------+-----------+----------+-------------------+ POP      Partial                 Yes                  acute as was in                                                           previous exam       +---------+---------------+---------+-----------+----------+-------------------+ PTV      Full                                                             +---------+---------------+---------+-----------+----------+-------------------+ PERO                                                  Color flow appears                                                        normal difficult to                                                       evaluate  compressiblity due                                                        to edema            +---------+---------------+---------+-----------+----------+-------------------+    Summary: Right: There is no evidence of deep vein thrombosis in the lower extremity. No cystic structure found in the popliteal fossa. Left: Findings consistent  with acute deep vein thrombosis involving the left popliteal vein. No cystic structure found in the popliteal fossa.  *See table(s) above for measurements and observations.    Preliminary    Ue Venous Duplex (mc & Wl 7 Am - 7 Pm)  Result Date: 11/11/2018 UPPER VENOUS STUDY  Indications: Swelling Performing Technologist: Toma Deiters RVS  Examination Guidelines: A complete evaluation includes B-mode imaging, spectral Doppler, color Doppler, and power Doppler as needed of all accessible portions of each vessel. Bilateral testing is considered an integral part of a complete examination. Limited examinations for reoccurring indications may be performed as noted.  Left Findings: +----------+------------+----------+---------+-----------+---------------------+ LEFT      CompressiblePropertiesPhasicitySpontaneous       Summary        +----------+------------+----------+---------+-----------+---------------------+ IJV         Partial                Yes       Yes     appears to be early                                                      formation proximally  +----------+------------+----------+---------+-----------+---------------------+ Subclavian                         Yes       Yes                          +----------+------------+----------+---------+-----------+---------------------+ Axillary      Full                                                        +----------+------------+----------+---------+-----------+---------------------+ Brachial      Full                                                        +----------+------------+----------+---------+-----------+---------------------+ Radial        Full                                                        +----------+------------+----------+---------+-----------+---------------------+ Ulnar         Full                                                         +----------+------------+----------+---------+-----------+---------------------+  Cephalic      Full                                                        +----------+------------+----------+---------+-----------+---------------------+ Basilic     Partial                                    [facility]    +----------+------------+----------+---------+-----------+---------------------+  Summary:  Left: Findings consistent with acute deep vein thrombosis involving the left internal jugular veins. Findings consistent with acute superficial vein thrombosis involving the left basilic vein.  *See table(s) above for measurements and observations.    Preliminary     ____________________________________________   PROCEDURES  Procedure(s) performed:   Procedures  None ____________________________________________   INITIAL IMPRESSION / ASSESSMENT AND PLAN / ED COURSE  Pertinent labs & imaging results that were available during my care of the patient were reviewed by me and considered in my medical decision making (see chart for details).  Patient presents to the ED with return of lower extremity edema and now LUE edema. No CP or SOB symptoms. Has history of venous clot that required venous stent. Spoke with Dr. Chestine Sporelark with Vascular Surgery who evaluated the patient in the ED. Will obtain US studies and review results. Sending other labs and CXR in the ED with LE edema. Patient does have close PCP follow up. LFTs mildly elevated but normal bilirubin and no abdominal pain.   Care transferred to Dr. Pecola Leisureeese pending US results.    ____________________________________________  FINAL CLINICAL IMPRESSION(S) / ED DIAGNOSES  Final diagnoses:  Edema, unspecified type    NEW OUTPATIENT MEDICATIONS STARTED DURING THIS VISIT:  New Prescriptions   FUROSEMIDE (LASIX) 20 MG TABLET    Take 1 tablet (20 mg total) by mouth daily for 5 days.   RIVAROXABAN 15 & 20 MG TBPK    Take as directed on  package: Start with one 15mg  tablet by mouth twice a day with food. On Day 22, switch to one 20mg  tablet once a day with food.    Note:  This document was prepared using Dragon voice recognition software and may include unintentional dictation errors.  Alona BeneJoshua Long, MD Emergency Medicine    Long, Arlyss RepressJoshua G, MD 11/11/18 2037

## 2018-11-11 NOTE — Progress Notes (Signed)
Left upper extremity venous duplex completed Preliminary results - Sluggish flow in the prox IJV consistant  with early formation of a DVT, Post[facility] for a superficial [facility] the [facility]ilic [facility] Derrika Ruffalo, RVS 11/11/2018 5:59 PM

## 2018-11-11 NOTE — ED Triage Notes (Signed)
Pt has a blood clot and had some removed by Dr. Darrick PennaFields on Oct 15th and 16th on left leg for a clot from knee to groin.  In the last 48 hours he is having bilateral leg swelling and swelling to groin area. Also swelling in left hand.  Reports sob with walking. Pt has palpable bilateral pedal pulses

## 2018-11-11 NOTE — Progress Notes (Signed)
IVC/iliac vein study completed.  [facility] view [facility] results.   11/11/2018  5:07 PM Mikhail Hallenbeck, Gerarda Guntherita D

## 2018-11-11 NOTE — ED Provider Notes (Signed)
Patient care assumed at 1600.  Pt with hx/o DVT s/p stenting, currently on eliquis here for evaluation of progressive BLE, LUE edema.  Denies CP/SOB.  He is currently taking spironolactone for edema.  Vascular US with no definite DVT.  Plan to d/c home with outpatient vascular surgery follow up.  Plan to change Eliquis to Xarelto.  Will provide short course of lasix.  Return precautions discussed.     Thomas Huerta, Thomas Boxley, MD 11/11/18 [phone]

## 2018-11-11 NOTE — Progress Notes (Signed)
Bilateral lower extremity venous completed Right neg Left non compressible popliteal as in previous exam. Thomas Huerta, RVS 11/11/2018 5:55 PM

## 2018-11-12 ENCOUNTER — Telehealth: Payer: Self-pay | Admitting: Vascular Surgery

## 2018-11-12 NOTE — Telephone Encounter (Signed)
sch appt spk to pt 11/18/2018 12pm UE art 1pm f/u PA

## 2018-11-12 NOTE — Telephone Encounter (Signed)
-----   Message from Cephus Shellinghristopher J Clark, MD sent at 11/11/2018  6:47 PM EST ----- Can we ensure Mr. Thomas JamesCaviness gets switched to Xarelto with script to pharmacy and follow-up in one week in clinic for left arm duplex (repeat).  Can see NP or PA.  Thanks,  Thayer Ohmhris

## 2018-11-15 ENCOUNTER — Other Ambulatory Visit: Payer: Self-pay

## 2018-11-15 DIAGNOSIS — M7989 Other specified soft tissue disorders: Secondary | ICD-10-CM

## 2018-11-18 ENCOUNTER — Ambulatory Visit: Payer: Self-pay | Admitting: Family

## 2018-11-18 ENCOUNTER — Telehealth (HOSPITAL_COMMUNITY): Payer: Self-pay | Admitting: *Deleted

## 2018-11-18 ENCOUNTER — Other Ambulatory Visit (HOSPITAL_COMMUNITY): Payer: BLUE CROSS/BLUE SHIELD

## 2018-11-18 NOTE — Telephone Encounter (Signed)
11/18/18 left msg asking for pt to call to confirm appt

## 2018-11-19 ENCOUNTER — Ambulatory Visit (INDEPENDENT_AMBULATORY_CARE_PROVIDER_SITE_OTHER): Payer: Self-pay | Admitting: Family

## 2018-11-19 ENCOUNTER — Other Ambulatory Visit: Payer: Self-pay

## 2018-11-19 ENCOUNTER — Ambulatory Visit (HOSPITAL_COMMUNITY)
Admission: RE | Admit: 2018-11-19 | Discharge: 2018-11-19 | Disposition: A | Discharge status: Home / Self Care | Payer: BLUE CROSS/BLUE SHIELD | Source: Ambulatory Visit | Attending: Vascular Surgery | Admitting: Vascular Surgery

## 2018-11-19 ENCOUNTER — Encounter: Payer: Self-pay | Admitting: Family

## 2018-11-19 VITALS — BP 132/94 | HR 59 | Temp 97.1°F | Resp 20 | Ht 69.0 in | Wt 158.0 lb

## 2018-11-19 DIAGNOSIS — Z9884 Bariatric surgery status: Secondary | ICD-10-CM

## 2018-11-19 DIAGNOSIS — M7989 Other specified soft tissue disorders: Secondary | ICD-10-CM | POA: Diagnosis present

## 2018-11-19 DIAGNOSIS — I82402 Acute embolism and thrombosis of unspecified deep veins of left lower extremity: Secondary | ICD-10-CM

## 2018-11-19 DIAGNOSIS — F17200 Nicotine dependence, unspecified, uncomplicated: Secondary | ICD-10-CM

## 2018-11-19 DIAGNOSIS — R600 Localized edema: Secondary | ICD-10-CM

## 2018-11-19 NOTE — Progress Notes (Signed)
CC: Follow up left femoral and popliteal vein DVT, thrombolysis and stent placement in left common and external iliac veins, and left IJ DVT  History of Present Illness  Thomas Huerta is a 63 y.o. (1956-04-17) male who is s/p thrombolytics catheter check of the left lower extremity, intravascular ultrasound of the left popliteal, superficial femoral, common femoral, external iliac, common iliac vein, and vena cava; and left common and external iliac angioplasty with stent placement (14 mm x 120 mm VICI and 14 mm x 60 VICI) on 09-11-18 by Dr. Carlis Abbott for left femoral and popliteal vein DVT.   Dr. Oneida Alar evaluated pt in the office on 10-21-18. At that time pt reported that swelling in his left leg had improved considerably. He still noted some swelling in his ankles if he was up on his feet all day.  This resolved mostly with leg elevation.  He had not been wearing compression stockings.  He was on Eliquis. Venous duplex of his inferior vena cava and iliac system on that day showed wide patency of the left common iliac and external iliac artery sten; the popliteal vein was non compressible; the femoral vein was compressible.  This represented an improvement from preoperatively. Improved left leg swelling status post left iliac venous stenting.  The patient was to wear compression stockings during the day and was given a pair of these in our office that day, 20 to 30 mm knee-high compression stockings.  He was to follow-up with a repeat duplex scan in April 2020.  At that time we will decide whether or not he needs further anticoagulation.  He was tocontinue his Eliquis in the meantime.  He presented to the ED on 11-11-18 with return of lower extremity edema and new LUE edema. No CP or SOB symptoms. Has history of venous clot that required venous stent. Dr. Carlis Abbott evaluated the patient in the ED.  Pt was switched from Eliquis to Xarelto at that visit.   Edema started from his left elbow to left  fingertips on 11-10-18, and completely resolved 3-4 days later, after he started the Xarelto.  Edema in his feet and legs mostly resolves every morning with overnight elevation.   Pt returns today for follow with left arm venous duplex up at the request of Dr. Carlis Abbott.   He has hepatic issues, is slightly icteric appearing, states it was recently discovered that he is not absorbing protein adequately. He has a history of gastric bypass in 2004. He states his left leg started swelling in August 2019, and right leg started swelling in October 2019.    Tobacco use: he resumed smoking on 11-11-18, states he had quit for two weeks pior to that, 5-10 cigs/day. He started smoking in 2006, 5 cigs/day. Diabetic: no   Past Medical History:  Diagnosis Date  . Back pain    chronic  . Complication of anesthesia    over medicated with Fentanyl  . History of kidney stones    5th time treating stones in 15 years  . Obesity    history of prior to gastric bypass    Social History Social History   Tobacco Use  . Smoking status: Former Smoker    Packs/day: 1.00    Types: Cigarettes    Last attempt to quit: 11/01/2018    Years since quitting: 0.0  . Smokeless tobacco: Never Used  Substance Use Topics  . Alcohol use: No  . Drug use: Yes    Types: Oxycodone  Comment: past history age 50 with perscription Percocet    Family History Family History  Problem Relation Age of Onset  . Lymphoma Mother   . Colon cancer Father     Surgical History Past Surgical History:  Procedure Laterality Date  . ANGIOPLASTY ILLIAC ARTERY Left 09/11/2018   Procedure: BALLOON ANGIOPLASTY LEFT ILIAC VEIN;  Surgeon: Marty Heck, MD;  Location: [facility];  Service: Vascular;  Laterality: Left;  . CYST EXCISION     left lower back beside the spine  . CYSTOSCOPY    . CYSTOSCOPY W/ URETERAL STENT REMOVAL Left 08/18/2017   Procedure: CYSTOSCOPY WITH STENT REMOVAL;  Surgeon: Royston Cowper, MD;  Location:  [facility];  Service: Urology;  Laterality: Left;  . CYSTOSCOPY WITH STENT PLACEMENT Left 06/16/2017   Procedure: CYSTOSCOPY WITH STENT PLACEMENT;  Surgeon: Royston Cowper, MD;  Location: [facility];  Service: Urology;  Laterality: Left;  . EXTRACORPOREAL SHOCK WAVE LITHOTRIPSY Left 06/25/2017   Procedure: EXTRACORPOREAL SHOCK WAVE LITHOTRIPSY (ESWL);  Surgeon: Royston Cowper, MD;  Location: [facility];  Service: Urology;  Laterality: Left;  . EXTRACORPOREAL SHOCK WAVE LITHOTRIPSY Left 08/06/2017   Procedure: EXTRACORPOREAL SHOCK WAVE LITHOTRIPSY (ESWL);  Surgeon: Royston Cowper, MD;  Location: [facility];  Service: Urology;  Laterality: Left;  . EXTRACORPOREAL SHOCK WAVE LITHOTRIPSY Right 11/05/2017   Procedure: EXTRACORPOREAL SHOCK WAVE LITHOTRIPSY (ESWL);  Surgeon: Royston Cowper, MD;  Location: [facility];  Service: Urology;  Laterality: Right;  . GASTRIC BYPASS  2004  . HERNIA REPAIR Bilateral    inguinal hernias  . INSERTION OF ILIAC STENT Left 09/11/2018   Procedure: INSERTION OF LEFT ILIAC STENT;  Surgeon: Marty Heck, MD;  Location: [place];  Service: Vascular;  Laterality: Left;  . INTRAVASCULAR ULTRASOUND/IVUS N/A 09/11/2018   Procedure: INTRAVASCULAR ULTRASOUND/IVUS;  Surgeon: Marty Heck, MD;  Location: [facility];  Service: Vascular;  Laterality: N/A;  . LOWER EXTREMITY VENOGRAPHY Left 09/10/2018   Procedure: LOWER EXTREMITY VENOGRAPHY;  Surgeon: Elam Dutch, MD;  Location: [facility];  Service: Cardiovascular;  Laterality: Left;  . PERIPHERAL VASCULAR THROMBECTOMY Left 09/10/2018   Procedure: PERIPHERAL VASCULAR THROMBECTOMY;  Surgeon: Elam Dutch, MD;  Location: [facility];  Service: Cardiovascular;  Laterality: Left;  lower extremity venous system.  . TONSILLECTOMY     and addenoids  . TONSILLECTOMY AND ADENOIDECTOMY    . URETEROSCOPY WITH HOLMIUM LASER LITHOTRIPSY Left 06/16/2017   Procedure: URETEROSCOPY WITH HOLMIUM LASER LITHOTRIPSY;   Surgeon: Royston Cowper, MD;  Location: [facility];  Service: Urology;  Laterality: Left;  Marland Kitchen VENOGRAM Left 09/11/2018   Procedure: VENOGRAM LEFT LEG;  Surgeon: Marty Heck, MD;  Location: [place];  Service: Vascular;  Laterality: Left;    Allergies  Allergen Reactions  . Fentanyl Other (See Comments)    Sedated for days    Current Outpatient Medications  Medication Sig Dispense Refill  . acetaminophen (TYLENOL) 500 MG tablet Take 1,000 mg by mouth every 6 (six) hours as needed (for pain.).    Marland Kitchen BELSOMRA 5 MG TABS Take 5 mg by mouth at bedtime as needed for sleep.  0  . buprenorphine-naloxone (SUBOXONE) 2-0.5 mg SUBL SL tablet Place 1 tablet under the tongue 3 (three) times daily. 30 tablet 0  . Multiple Vitamins-Minerals (MULTIVITAMIN WITH MINERALS) tablet Take 1 tablet by mouth daily.    Marland Kitchen PROAIR HFA 108 (90 Base) MCG/ACT inhaler Inhale 2 puffs into the lungs every 6 (six)  hours as needed for wheezing or shortness of breath.  0  . Rivaroxaban 15 & 20 MG TBPK Take as directed on package: Start with one 45m tablet by mouth twice a day with food. On Day 22, switch to one 258mtablet once a day with food. 51 each 0  . spironolactone (ALDACTONE) 50 MG tablet Take 50 mg by mouth daily.  0  . tobramycin (TOBREX) 0.3 % ophthalmic solution Place 1 drop into the left eye every 4 (four) hours. 5 mL 0  . ELIQUIS STARTER PACK (ELIQUIS STARTER PACK) 5 MG TABS Take 5 mg by mouth 2 (two) times daily.  0  . furosemide (LASIX) 20 MG tablet Take 1 tablet (20 mg total) by mouth daily for 5 days. 5 tablet 0   No current facility-administered medications for this visit.     REVIEW OF SYSTEMS: see HPI for pertinent positives and negatives   Physical Examination  Vitals:   11/19/18 0931  BP: (!) 132/94  Pulse: (!) 59  Resp: 20  Temp: (!) 97.1 F (36.2 C)  SpO2: 99%  Weight: 158 lb (71.7 kg)  Height: '5\' 9"'  (1.753 m)   Body mass index is 23.33 kg/m.  General: The patient appears older  than his stated age.   HEENT:  Pale conjunctiva Pulmonary: Respirations are non-labored, CTAB, adequate air movement in all fields Abdomen: Soft and non-tender with normal pitched bowel sounds.  Musculoskeletal: There are no major deformities.  2-3+ pitting edema in both feet, calves, and thighs. 1+ pitting edema just proximal to left elbow.   Neurologic: No focal weakness or paresthesias are detected. Skin: There are no ulcer or rashes noted. Several areas of ecchymosis at bilateral forearms. Generalized pallor. Slightly icteric appearing.  Psychiatric: The patient has normal affect. Cardiovascular: There is a regular rate and rhythm without significant murmur appreciated.    Vascular: Vessel Right Left  Radial 2+Palpable 2+Palpable  Brachial 1+Palpable 1+Palpable  Carotid  without bruit  without bruit  Aorta Not palpable N/A  Femoral 2+Palpable 2+Palpable  Popliteal Not palpable Not palpable  PT 2+Palpable Not Palpable  DP 2+Palpable 2+Palpable     DATA  Left Upper Extremity Venous Duplex (Date: 11/19/2018):  Left Findings: +----------+------------+-----------------+---------+-----------+--------------+ LEFT      Compressible   Properties    PhasicitySpontaneous   Summary     +----------+------------+-----------------+---------+-----------+--------------+ IJV         Partial        spongy         Yes       Yes     Roleaux flow                          w/compression                        w/possible                                                                 thrombus    +----------+------------+-----------------+---------+-----------+--------------+ Subclavian    Full                        Yes  minimal                                                                   chronic                                                                thrombus noted                                                                 in the                                                                  clavicular                                                                   area      +----------+------------+-----------------+---------+-----------+--------------+ Axillary      Full                        Yes                             +----------+------------+-----------------+---------+-----------+--------------+ Brachial      Full                        Yes                             +----------+------------+-----------------+---------+-----------+--------------+ Radial        Full                                                        +----------+------------+-----------------+---------+-----------+--------------+ Ulnar         Full                                                        +----------+------------+-----------------+---------+-----------+--------------+ Cephalic      Full                                                        +----------+------------+-----------------+---------+-----------+--------------+  Basilic       Full                                                        +----------+------------+-----------------+---------+-----------+--------------+  Left IJV demonstrates roleaux flow with possible thrombus at origin. 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   Medical Decision Making  Thomas Huerta is a 63 y.o. male who is s/p thrombolytics catheter check of the left lower extremity, intravascular ultrasound of the left popliteal, superficial femoral, common femoral, external iliac, common iliac vein, and vena cava; and left common and external iliac angioplasty with stent placement (14 mm x 120 mm VICI and 14 mm x 60 VICI) on 09-11-18 by Dr. Carlis Abbott for left femoral and popliteal vein DVT.   He was evaluated in [facility] ED on 11-11-18 with return of lower extremity  edema and new LUE edema.   Left UE venous duplex today demonstrates roleaux flow with possible thrombus at origin. 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  Dr. Donzetta Matters spoke with and examined pt.   Pt is already scheduled to return in April 2020 with DVT duplex of iliacs and lower extremities; will keep those studies and add venous reflux studies of bilateral lower extremities, he is scheduled to see Dr. Oneida Alar afterward.   Pt is scheduled to see Dr. Tasia Catchings, medical oncology, on 11-24-18.   Continue Xarelto; Dr. Tasia Catchings to then to manage use of anticoagulants.   Pt has a history of gastric bypass in 2004.  He is icteric appearing and pale. He states that he was told that is was discovered fairly recently that he has liver problems and protein absorption problems.   11-11-18 AST, ALT, and alk phos are slightly elevated. Calcium, albumin, and protein are below normal. Normal H/H with low RBC at that date.   His left arm edema has improved, and mostly resolved since on Xarelto.  Pt states that his bilateral legs pitting edema mostly resolves with overnight elevation. He states that he has been wearing the knee high compression hose that he was given; however, he has 2-3+ pitting edema in both thighs, and needs thigh high compression hose.  He was measured for thigh high compression hose today, 20-30 mm Hg compression, and given a pair.   Pt was not receptive to smoking cessation counseling.    Clemon Chambers, RN, MSN, FNP-C [facility] Office: [phone]  11/19/2018, 9:35 AM  Clinic MD: Donzetta Matters

## 2018-11-19 NOTE — Patient Instructions (Signed)
Steps to Quit Smoking  Smoking tobacco can be bad for your health. It can also affect almost every organ in your body. Smoking puts you and people around you at risk for many serious long-lasting (chronic) diseases. Quitting smoking is hard, but it is one of the best things that you can do for your health. It is never too late to quit. What are the benefits of quitting smoking? When you quit smoking, you lower your risk for getting serious diseases and conditions. They can include:  Lung cancer or lung disease.  Heart disease.  Stroke.  Heart attack.  Not being able to have children (infertility).  Weak bones (osteoporosis) and broken bones (fractures). If you have coughing, wheezing, and shortness of breath, those symptoms may get better when you quit. You may also get sick less often. If you are pregnant, quitting smoking can help to lower your chances of having a baby of low birth weight. What can I do to help me quit smoking? Talk with your doctor about what can help you quit smoking. Some things you can do (strategies) include:  Quitting smoking totally, instead of slowly cutting back how much you smoke over a period of time.  Going to in-person counseling. You are more likely to quit if you go to many counseling sessions.  Using resources and support systems, such as: ? Online chats with a counselor. ? Phone quitlines. ? Printed self-help materials. ? Support groups or group counseling. ? Text messaging programs. ? Mobile phone apps or applications.  Taking medicines. Some of these medicines may have nicotine in them. If you are pregnant or breastfeeding, do not take any medicines to quit smoking unless your doctor says it is okay. Talk with your doctor about counseling or other things that can help you. Talk with your doctor about using more than one strategy at the same time, such as taking medicines while you are also going to in-person counseling. This can help make  quitting easier. What things can I do to make it easier to quit? Quitting smoking might feel very hard at first, but there is a lot that you can do to make it easier. Take these steps:  Talk to your family and friends. Ask them to support and encourage you.  Call phone quitlines, reach out to support groups, or work with a counselor.  Ask people who smoke to not smoke around you.  Avoid places that make you want (trigger) to smoke, such as: ? Bars. ? Parties. ? Smoke-break areas at work.  Spend time with people who do not smoke.  Lower the stress in your life. Stress can make you want to smoke. Try these things to help your stress: ? Getting regular exercise. ? Deep-breathing exercises. ? Yoga. ? Meditating. ? Doing a body scan. To do this, close your eyes, focus on one area of your body at a time from head to toe, and notice which parts of your body are tense. Try to relax the muscles in those areas.  Download or buy apps on your mobile phone or tablet that can help you stick to your quit plan. There are many free apps, such as QuitGuide from the CDC (Centers for Disease Control and Prevention). You can find more support from smokefree.gov and other websites. This information is not intended to replace advice given to you by your health care provider. Make sure you discuss any questions you have with your health care provider. Document Released: 08/30/2009 Document Revised: 07/01/2016   Document Reviewed: 03/20/2015 Elsevier Interactive Patient Education  2019 Elsevier Inc.     Deep Vein Thrombosis  Deep vein thrombosis (DVT) is a condition in which a blood clot forms in a deep vein, such as a lower leg, thigh, or arm vein. A clot is blood that has thickened into a gel or solid. This condition is dangerous. It can lead to serious and even life-threatening complications if the clot travels to the lungs and causes a blockage (pulmonary embolism). It can also damage veins in the leg.  This can result in leg pain, swelling, discoloration, and sores (post-thrombotic syndrome). What are the causes? This condition may be caused by:  A slowdown of blood flow.  Damage to a vein.  A condition that causes blood to clot more easily, such as an inherited clotting disorder. What increases the risk? The following factors may make you more likely to develop this condition:  Being overweight.  Being older, especially over age 61.  Sitting or lying down for more than four hours.  Being in the hospital.  Lack of physical activity (sedentary lifestyle).  Pregnancy, being in childbirth, or having recently given birth.  Taking medicines that contain estrogen, such as medicines to prevent pregnancy.  Smoking.  A history of any of the following: ? Blood clots or a blood clotting disease. ? Peripheral vascular disease. ? Inflammatory bowel disease. ? Cancer. ? Heart disease. ? Genetic conditions that affect how your blood clots, such as Factor V Leiden mutation. ? Neurological diseases that affect your legs (leg paresis). ? A recent injury, such as a car accident. ? Major or lengthy surgery. ? A central line placed inside a large vein. What are the signs or symptoms? Symptoms of this condition include:  Swelling, pain, or tenderness in an arm or leg.  Warmth, redness, or discoloration in an arm or leg. If the clot is in your leg, symptoms may be more noticeable or worse when you stand or walk. Some people may not develop any symptoms. How is this diagnosed? This condition is diagnosed with:  A medical history and physical exam.  Tests, such as: ? Blood tests. These are done to check how well your blood clots. ? Ultrasound. This is done to check for clots. ? Venogram. For this test, contrast dye is injected into a vein and X-rays are taken to check for any clots. How is this treated? Treatment for this condition depends on:  The cause of your DVT.  Your risk  for bleeding or developing more clots.  Any other medical conditions that you have. Treatment may include:  Taking a blood thinner (anticoagulant). This type of medicine prevents clots from forming. It may be taken by mouth, injected under the skin, or injected through an IV (catheter).  Injecting clot-dissolving medicines into the affected vein (catheter-directed thrombolysis).  Having surgery. Surgery may be done to: ? Remove the clot. ? Place a filter in a large vein to catch blood clots before they reach the lungs. Some treatments may be continued for up to six months. Follow these instructions at home: If you are taking blood thinners:  Take the medicine exactly as told by your health care provider. Some blood thinners need to be taken at the same time every day. Do not skip a dose.  Talk with your health care provider before you take any medicines that contain aspirin or NSAIDs. These medicines increase your risk for dangerous bleeding.  Ask your health care provider about foods and  drugs that could change the way the medicine works (may interact). Avoid those things if your health care provider tells you to do so.  Blood thinners can cause easy bruising and may make it difficult to stop bleeding. Because of this: ? Be very careful when using knives, scissors, or other sharp objects. ? Use an electric razor instead of a blade. ? Avoid activities that could cause injury or bruising, and follow instructions about how to prevent falls.  Wear a medical alert bracelet or carry a card that lists what medicines you take. General instructions  Take over-the-counter and prescription medicines only as told by your health care provider.  Return to your normal activities as told by your health care provider. Ask your health care provider what activities are safe for you.  Wear compression stockings if recommended by your health care provider.  Keep all follow-up visits as told by your  health care provider. This is important. How is this prevented? To lower your risk of developing this condition again:  For 30 or more minutes every day, do an activity that: ? Involves moving your arms and legs. ? Increases your heart rate.  When traveling for longer than four hours: ? Exercise your arms and legs every hour. ? Drink plenty of water. ? Avoid drinking alcohol.  Avoid sitting or lying for a long time without moving your legs.  If you have surgery or you are hospitalized, ask about ways to prevent blood clots. These may include taking frequent walks or using anticoagulants.  Stay at a healthy weight.  If you are a woman who is older than age 1, avoid unnecessary use of medicines that contain estrogen, such as some birth control pills.  Do not use any products that contain nicotine or tobacco, such as cigarettes and e-cigarettes. This is especially important if you take estrogen medicines. If you need help quitting, ask your health care provider. Contact a health care provider if:  You miss a dose of your blood thinner.  Your menstrual period is heavier than usual.  You have unusual bruising. Get help right away if:  You have: ? New or increased pain, swelling, or redness in an arm or leg. ? Numbness or tingling in an arm or leg. ? Shortness of breath. ? Chest pain. ? A rapid or irregular heartbeat. ? A severe headache or confusion. ? A cut that will not stop bleeding.  There is blood in your vomit, stool, or urine.  You have a serious fall or accident, or you hit your head.  You feel light-headed or dizzy.  You cough up blood. These symptoms may represent a serious problem that is an emergency. Do not wait to see if the symptoms will go away. Get medical help right away. Call your local emergency services (911 in the [place] Do not drive yourself to the hospital. Summary  Deep vein thrombosis (DVT) is a condition in which a blood clot forms in a deep  vein, such as a lower leg, thigh, or arm vein.  Symptoms can include swelling, warmth, pain, and redness in your leg or arm.  This condition may be treated with a blood thinner (anticoagulant medicine), medicine that is injected to dissolve blood clots,compression stockings, or surgery.  If you are prescribed blood thinners, take them exactly as told. This information is not intended to replace advice given to you by your health care provider. Make sure you discuss any questions you have with your health care provider. Document  Released: 11/03/2005 Document Revised: 04/03/2017 Document Reviewed: 04/03/2017 Elsevier Interactive Patient Education  2019 [employer].    To decrease swelling in your feet and legs: Elevate feet above slightly bent knees, feet above heart, overnight and 3-4 times per day for 20 minutes.

## 2018-11-24 ENCOUNTER — Inpatient Hospital Stay: Payer: BLUE CROSS/BLUE SHIELD | Attending: Oncology | Admitting: Oncology

## 2018-11-24 ENCOUNTER — Other Ambulatory Visit: Payer: Self-pay

## 2018-11-24 ENCOUNTER — Inpatient Hospital Stay: Payer: BLUE CROSS/BLUE SHIELD

## 2018-11-24 VITALS — BP 133/89 | HR 62 | Temp 98.3°F | Ht 69.0 in | Wt 158.0 lb

## 2018-11-24 DIAGNOSIS — E46 Unspecified protein-calorie malnutrition: Secondary | ICD-10-CM | POA: Insufficient documentation

## 2018-11-24 DIAGNOSIS — D7589 Other specified diseases of blood and blood-forming organs: Secondary | ICD-10-CM | POA: Diagnosis present

## 2018-11-24 DIAGNOSIS — Z86718 Personal history of other venous thrombosis and embolism: Secondary | ICD-10-CM | POA: Diagnosis not present

## 2018-11-24 DIAGNOSIS — E8809 Other disorders of plasma-protein metabolism, not elsewhere classified: Secondary | ICD-10-CM | POA: Diagnosis not present

## 2018-11-24 DIAGNOSIS — Z87891 Personal history of nicotine dependence: Secondary | ICD-10-CM | POA: Diagnosis not present

## 2018-11-24 DIAGNOSIS — R740 Nonspecific elevation of levels of transaminase and lactic acid dehydrogenase [LDH]: Secondary | ICD-10-CM | POA: Diagnosis not present

## 2018-11-24 DIAGNOSIS — R7401 Elevation of levels of liver transaminase levels: Secondary | ICD-10-CM

## 2018-11-24 DIAGNOSIS — Z79899 Other long term (current) drug therapy: Secondary | ICD-10-CM | POA: Diagnosis not present

## 2018-11-24 DIAGNOSIS — R600 Localized edema: Secondary | ICD-10-CM

## 2018-11-24 DIAGNOSIS — Z9884 Bariatric surgery status: Secondary | ICD-10-CM | POA: Diagnosis not present

## 2018-11-24 LAB — CBC WITH DIFFERENTIAL/PLATELET
Abs Immature Granulocytes: 0.01 10*3/uL (ref 0.00–0.07)
Basophils Absolute: 0 10*3/uL (ref 0.0–0.1)
Basophils Relative: 1 %
Eosinophils Absolute: 0.1 10*3/uL (ref 0.0–0.5)
Eosinophils Relative: 1 %
HCT: 37.5 % — ABNORMAL LOW (ref 39.0–52.0)
Hemoglobin: 12.7 g/dL — ABNORMAL LOW (ref 13.0–17.0)
Immature Granulocytes: 0 %
Lymphocytes Relative: 29 %
Lymphs Abs: 1.5 10*3/uL (ref 0.7–4.0)
MCH: 34 pg (ref 26.0–34.0)
MCHC: 33.9 g/dL (ref 30.0–36.0)
MCV: 100.3 fL — ABNORMAL HIGH (ref 80.0–100.0)
Monocytes Absolute: 0.4 10*3/uL (ref 0.1–1.0)
Monocytes Relative: 8 %
Neutro Abs: 3.1 10*3/uL (ref 1.7–7.7)
Neutrophils Relative %: 61 %
Platelets: 237 10*3/uL (ref 150–400)
RBC: 3.74 MIL/uL — ABNORMAL LOW (ref 4.22–5.81)
RDW: 13.5 % (ref 11.5–15.5)
WBC: 5 10*3/uL (ref 4.0–10.5)
nRBC: 0 % (ref 0.0–0.2)

## 2018-11-24 LAB — COMPREHENSIVE METABOLIC PANEL
ALT: 44 U/L (ref 0–44)
AST: 38 U/L (ref 15–41)
Albumin: 2.1 g/dL — ABNORMAL LOW (ref 3.5–5.0)
Alkaline Phosphatase: 159 U/L — ABNORMAL HIGH (ref 38–126)
Anion gap: 5 (ref 5–15)
BUN: 20 mg/dL (ref 6–20)
CO2: 26 mmol/L (ref 22–32)
Calcium: 7.5 mg/dL — ABNORMAL LOW (ref 8.9–10.3)
Chloride: 110 mmol/L (ref 98–111)
Creatinine, Ser: 0.93 mg/dL (ref 0.61–1.24)
GFR calc Af Amer: >60 mL/min (ref >60)
GFR calc non Af Amer: >60 mL/min (ref >60)
Glucose, Bld: 70 mg/dL (ref 70–99)
Potassium: 3.7 mmol/L (ref 3.5–5.1)
Sodium: 141 mmol/L (ref 135–145)
Total Bilirubin: 0.6 mg/dL (ref 0.3–1.2)
Total Protein: 4.3 g/dL — ABNORMAL LOW (ref 6.5–8.1)

## 2018-11-24 LAB — PROTIME-INR
INR: 2.09
Prothrombin Time: 23.2 seconds — ABNORMAL HIGH (ref 11.4–15.2)

## 2018-11-24 LAB — TECHNOLOGIST SMEAR REVIEW: Tech Review: NONE SEEN

## 2018-11-24 LAB — FOLATE: Folate: 16.2 ng/mL (ref >5.9)

## 2018-11-24 LAB — TSH: TSH: 1.759 u[IU]/mL (ref 0.350–4.500)

## 2018-11-24 LAB — APTT: aPTT: 38 seconds — ABNORMAL HIGH (ref 24–36)

## 2018-11-24 LAB — VITAMIN B12: Vitamin B-12: 490 pg/mL (ref 180–914)

## 2018-11-24 LAB — LACTATE DEHYDROGENASE: LDH: 205 U/L — ABNORMAL HIGH (ref 98–192)

## 2018-11-24 NOTE — Progress Notes (Signed)
Patient is here to establish care as a new patient. Patient stated that he had been having bilateral upper extremity edema.

## 2018-11-25 LAB — HEPATITIS PANEL, ACUTE
HCV Ab: 0.1 s/co ratio (ref 0.0–0.9)
Hep A IgM: NEGATIVE
Hep B C IgM: NEGATIVE
Hepatitis B Surface Ag: NEGATIVE

## 2018-11-25 NOTE — Progress Notes (Signed)
Hematology/Oncology Consult note [facility] Telephone:([phone] Fax:([phone]) [phone]   Patient Care Team: Retia Passe, NP as PCP - General (Nurse Practitioner)  REFERRING PROVIDER: Chodri CHIEF COMPLAINTS/REASON FOR VISIT:  Evaluation of abnormal labs  HISTORY OF PRESENTING ILLNESS:  Thomas Huerta is a  63 y.o.  male with PMH listed below who was referred to me for evaluation of abnormal labs In October 2019, patient presented with complaints of 6 weeks history of bilateral leg swelling, left worse than right.  Ultrasound was obtained which showed DVT throughout the left lower extremity extending up to the level of common femoral vein. Patient was placed on Eliquis with partial improvement of lower extremity swelling.  He was evaluated by Dr. Darrick Penna vascular surgery at that time.  09/10/2018 patient underwent left lower extremity venous thrombolysis and iliac stent. Presented to emergency room on 11/11/2018 for evaluation of worsening bilateral lower extremity swelling with pain in the left leg, .  Also developed left arm swelling.  Patient reported being compliant with Eliquis.   Duplex suggested No evidence of thrombus involving IVC.  Patent bilateral common iliac vein and right external iliac vein stent.  Acute superficial vein thrombosis involving left basilic vein. Left internal jugular vein acute deep vein thrombosis. Patient was evaluated by vascular surgery and recommended switch Eliquis to Xarelto. patient reports swelling of left arm has not improved.  Still has lower extremity swelling. Lab work-up showed low albumin at 2.1, mild transaminitis with elevated AST and ALT, hemoglobin 12.7, MCV 102 Patient was referred to hematology for further evaluation. Denies any previous personal history of thrombosis.   Family history positive for mother deceased from lymphoma and father deceased from colon cancer.    He has a history of gastric bypass in  2004.  He takes multivitamin daily.  Denies any contributing immobilization factors prior to thrombosis events. Reports feeling tired.  Denies night sweating, fever or chills.  MEDICAL HISTORY:  Past Medical History:  Diagnosis Date  . Back pain    chronic  . Complication of anesthesia    over medicated with Fentanyl  . History of kidney stones    5th time treating stones in 15 years  . Obesity    history of prior to gastric bypass    SURGICAL HISTORY: Past Surgical History:  Procedure Laterality Date  . ANGIOPLASTY ILLIAC ARTERY Left 09/11/2018   Procedure: BALLOON ANGIOPLASTY LEFT ILIAC VEIN;  Surgeon: Cephus Shelling, MD;  Location: [facility] OR;  Service: Vascular;  Laterality: Left;  . CYST EXCISION     left lower back beside the spine  . CYSTOSCOPY    . CYSTOSCOPY W/ URETERAL STENT REMOVAL Left 08/18/2017   Procedure: CYSTOSCOPY WITH STENT REMOVAL;  Surgeon: Orson Ape, MD;  Location: [facility] ORS;  Service: Urology;  Laterality: Left;  . CYSTOSCOPY WITH STENT PLACEMENT Left 06/16/2017   Procedure: CYSTOSCOPY WITH STENT PLACEMENT;  Surgeon: Orson Ape, MD;  Location: [facility] ORS;  Service: Urology;  Laterality: Left;  . EXTRACORPOREAL SHOCK WAVE LITHOTRIPSY Left 06/25/2017   Procedure: EXTRACORPOREAL SHOCK WAVE LITHOTRIPSY (ESWL);  Surgeon: Orson Ape, MD;  Location: [facility] ORS;  Service: Urology;  Laterality: Left;  . EXTRACORPOREAL SHOCK WAVE LITHOTRIPSY Left 08/06/2017   Procedure: EXTRACORPOREAL SHOCK WAVE LITHOTRIPSY (ESWL);  Surgeon: Orson Ape, MD;  Location: [facility] ORS;  Service: Urology;  Laterality: Left;  . EXTRACORPOREAL SHOCK WAVE LITHOTRIPSY Right 11/05/2017   Procedure: EXTRACORPOREAL SHOCK WAVE LITHOTRIPSY (ESWL);  Surgeon: Orson Ape,  MD;  Location: [facility] ORS;  Service: Urology;  Laterality: Right;  . GASTRIC BYPASS  2004  . HERNIA REPAIR Bilateral    inguinal hernias  . INSERTION OF ILIAC STENT Left 09/11/2018   Procedure: INSERTION OF LEFT  ILIAC STENT;  Surgeon: Cephus Shelling, MD;  Location: [facility] OR;  Service: Vascular;  Laterality: Left;  . INTRAVASCULAR ULTRASOUND/IVUS N/A 09/11/2018   Procedure: INTRAVASCULAR ULTRASOUND/IVUS;  Surgeon: Cephus Shelling, MD;  Location: [facility] OR;  Service: Vascular;  Laterality: N/A;  . LOWER EXTREMITY VENOGRAPHY Left 09/10/2018   Procedure: LOWER EXTREMITY VENOGRAPHY;  Surgeon: Sherren Kerns, MD;  Location: [facility] INVASIVE CV LAB;  Service: Cardiovascular;  Laterality: Left;  . PERIPHERAL VASCULAR THROMBECTOMY Left 09/10/2018   Procedure: PERIPHERAL VASCULAR THROMBECTOMY;  Surgeon: Sherren Kerns, MD;  Location: [facility] INVASIVE CV LAB;  Service: Cardiovascular;  Laterality: Left;  lower extremity venous system.  . TONSILLECTOMY     and addenoids  . TONSILLECTOMY AND ADENOIDECTOMY    . URETEROSCOPY WITH HOLMIUM LASER LITHOTRIPSY Left 06/16/2017   Procedure: URETEROSCOPY WITH HOLMIUM LASER LITHOTRIPSY;  Surgeon: Orson Ape, MD;  Location: [facility] ORS;  Service: Urology;  Laterality: Left;  Marland Kitchen VENOGRAM Left 09/11/2018   Procedure: VENOGRAM LEFT LEG;  Surgeon: Cephus Shelling, MD;  Location: [facility] OR;  Service: Vascular;  Laterality: Left;    SOCIAL HISTORY: Social History   Socioeconomic History  . Marital status: Married    Spouse name: Not on file  . Number of children: Not on file  . Years of education: Not on file  . Highest education level: Not on file  Occupational History  . Not on file  Social Needs  . Financial resource strain: Not on file  . Food insecurity:    Worry: Not on file    Inability: Not on file  . Transportation needs:    Medical: Not on file    Non-medical: Not on file  Tobacco Use  . Smoking status: Former Smoker    Packs/day: 1.00    Types: Cigarettes    Last attempt to quit: 11/01/2018    Years since quitting: 0.0  . Smokeless tobacco: Never Used  Substance and Sexual Activity  . Alcohol use: No  . Drug use: Yes    Types: Oxycodone     Comment: past history age 89 with perscription Percocet  . Sexual activity: Not on file  Lifestyle  . Physical activity:    Days per week: Not on file    Minutes per session: Not on file  . Stress: Not on file  Relationships  . Social connections:    Talks on phone: Not on file    Gets together: Not on file    Attends religious service: Not on file    Active member of club or organization: Not on file    Attends meetings of clubs or organizations: Not on file    Relationship status: Not on file  . Intimate partner violence:    Fear of current or ex partner: Not on file    Emotionally abused: Not on file    Physically abused: Not on file    Forced sexual activity: Not on file  Other Topics Concern  . Not on file  Social History Narrative  . Not on file    FAMILY HISTORY: Family History  Problem Relation Age of Onset  . Lymphoma Mother   . Colon cancer Father     ALLERGIES:  is allergic to fentanyl.  MEDICATIONS:  Current Outpatient Medications  Medication Sig Dispense Refill  . acetaminophen (TYLENOL) 500 MG tablet Take 1,000 mg by mouth every 6 (six) hours as needed (for pain.).    Marland Kitchen BELSOMRA 5 MG TABS Take 5 mg by mouth at bedtime as needed for sleep.  0  . Multiple Vitamins-Minerals (MULTIVITAMIN WITH MINERALS) tablet Take 1 tablet by mouth daily.    Marland Kitchen PROAIR HFA 108 (90 Base) MCG/ACT inhaler Inhale 2 puffs into the lungs every 6 (six) hours as needed for wheezing or shortness of breath.  0  . Rivaroxaban 15 & 20 MG TBPK Take as directed on package: Start with one 15mg  tablet by mouth twice a day with food. On Day 22, switch to one 20mg  tablet once a day with food. 51 each 0  . tobramycin (TOBREX) 0.3 % ophthalmic solution Place 1 drop into the left eye every 4 (four) hours. 5 mL 0   No current facility-administered medications for this visit.      PHYSICAL EXAMINATION: ECOG PERFORMANCE STATUS: 1 - Symptomatic but completely ambulatory Vitals:   11/24/18 1517    BP: 133/89  Pulse: 62  Temp: 98.3 F (36.8 C)   Filed Weights   11/24/18 1517  Weight: 158 lb (71.7 kg)    Physical Exam Constitutional:      General: He is not in acute distress. HENT:     Head: Normocephalic and atraumatic.  Eyes:     General: No scleral icterus.    Pupils: Pupils are equal, round, and reactive to light.  Neck:     Musculoskeletal: Normal range of motion and neck supple.  Cardiovascular:     Rate and Rhythm: Normal rate and regular rhythm.     Heart sounds: Normal heart sounds.  Pulmonary:     Effort: Pulmonary effort is normal. No respiratory distress.     Breath sounds: No wheezing.  Abdominal:     General: Bowel sounds are normal. There is no distension.     Palpations: Abdomen is soft. There is no mass.     Tenderness: There is no abdominal tenderness.  Musculoskeletal: Normal range of motion.        General: Swelling present. No deformity.     Comments: Bilateral lower extremity 2+ edema  Skin:    General: Skin is warm and dry.     Findings: No erythema or rash.  Neurological:     Mental Status: He is alert and oriented to person, place, and time.     Cranial Nerves: No cranial nerve deficit.     Coordination: Coordination normal.  Psychiatric:     Comments: Extremely anxious      LABORATORY DATA:  I have reviewed the data as listed Lab Results  Component Value Date   WBC 5.0 11/24/2018   HGB 12.7 (L) 11/24/2018   HCT 37.5 (L) 11/24/2018   MCV 100.3 (H) 11/24/2018   PLT 237 11/24/2018   Recent Labs    09/11/18 0546 11/11/18 1445 11/24/18 1623  NA 139 141 141  K 3.8 3.9 3.7  CL 110 112* 110  CO2 25 23 26   GLUCOSE 86 80 70  BUN 7 13 20   CREATININE 0.73 1.17 0.93  CALCIUM 7.5* 7.7* 7.5*  GFRNONAA >60 >60 >60  GFRAA >60 >60 >60  PROT  --  4.2* 4.3*  ALBUMIN  --  2.1* 2.1*  AST  --  46* 38  ALT  --  57* 44  ALKPHOS  --  135*  159*  BILITOT  --  0.7 0.6   Iron/TIBC/Ferritin/ %Sat No results found for: IRON, TIBC,  FERRITIN, IRONPCTSAT   RADIOGRAPHIC STUDIES: I have personally reviewed the radiological images as listed and agreed with the findings in the report. 06/11/2017 CT abdomen pelvis without contrast Bilateral nonobstructing nephro lithiasis including a 12 mm stone within the left renal collecting system.  2.6 elongated stone within the distal left ureter. Aortic atherosclerosis. Bilateral pulmonary nodules measuring up to 6 mm. 08/09/2018 CT chest without contrast- radiology No suspicious lung nodule or mass were seen.  Small nodules are present bilaterally of no more than 6 mm in diameter.  Slight prominence of the ascending thoracic aorta measuring 39 mm in diameter.  Mild thoracic aortic atherosclerosis.  Some stranding in the soft tissue of the left upper quadrant adjacent to the splenic fracture of colon of questionable significance. 10/03/2018 CT head without contrast No acute intracranial abnormality noted. 11/11/2018 chest x-ray two-view Small bilateral pleural effusion.  The nodular density seen on the prior CT is not well appreciated on today's exam.  ASSESSMENT & PLAN:  1. Macrocytosis   2. Hypoalbuminemia   3. Transaminitis   4. Personal history of venous thrombosis and embolism   5. Bilateral lower extremity edema    Labs and recent images including CT and x-ray were independently reviewed by me and discussed with patient. #Unprovoked recurrent deep venous thrombosis. Recommend long-term anticoagulation.  Tolerates Xarelto.  Continue. I will obtain hypercoagulable work-up.  I will start with obtaining factor V Leiden mutation, prothrombin gene mutation. I will hold off obtaining antiphospholipid panel, protein C and S levels in the acute setting as test can be falsely positive.  # Transaminitis and elevation of alkaline phosphatase.   check hepatitis panel, coags, check abdominal ultrasound.  #Bilateral lower extremity edema, hypoalbumin anemia,  Patient has had a 2D  echo done on 08/09/2018. ? Nephrotic syndrome which may cause hypercoagulable state. Check UA, TSH.   #Macrocytosis, check vitamin B12 level, folate level, LDH. I asked patient to make a follow-up appointment with me in 2 weeks to discuss lab work up results and further management plan. During interval if any lab results require intervention, we will call him and update him. Patient appears extremely anxious.  He says "waiting 2 weeks for lab results is not acceptable."  I explained to patient that some of the lab tests take 7 to 10 days to result.  It is okay to return in a week to discuss lab results, however there is a possibility of some results still pending. Patient requests me to call and update after every lab result comes back.  He says" I am anxious to know both my good results and bad results"  I discussed with patient that we will update him if any of his results need acute attention and intervention.  If he prefers, we can update him will most of his results come back.  However it is not realistic to give him a call after every result comes back. he  says " I do not think we get along with each other". I asked him if he would like to switch to other providers. He says" I don't know".   Orders Placed This Encounter  Procedures  . US Abdomen Complete    Standing Status:   Future    Standing Expiration Date:   11/25/2019    Order Specific Question:   Reason for Exam (SYMPTOM  OR DIAGNOSIS REQUIRED)    Answer:  transaminitis, hypoalbuminemia    Order Specific Question:   Preferred imaging location?    Answer:   [facility]  . CBC with Differential/Platelet    Standing Status:   Future    Number of Occurrences:   1    Standing Expiration Date:   11/25/2019  . Technologist smear review    Standing Status:   Future    Number of Occurrences:   1    Standing Expiration Date:   11/25/2019  . Folate    Standing Status:   Future    Number of Occurrences:   1    Standing Expiration  Date:   11/25/2019  . Vitamin B12    Standing Status:   Future    Number of Occurrences:   1    Standing Expiration Date:   11/25/2019  . Factor 5 leiden    Standing Status:   Future    Number of Occurrences:   1    Standing Expiration Date:   11/25/2019  . Prothrombin gene mutation    Standing Status:   Future    Number of Occurrences:   1    Standing Expiration Date:   11/25/2019  . APTT    Standing Status:   Future    Number of Occurrences:   1    Standing Expiration Date:   11/25/2019  . Protime-INR    Standing Status:   Future    Number of Occurrences:   1    Standing Expiration Date:   11/25/2019  . TSH    Standing Status:   Future    Number of Occurrences:   1    Standing Expiration Date:   11/25/2019  . Comprehensive metabolic panel    Standing Status:   Future    Number of Occurrences:   1    Standing Expiration Date:   11/25/2019  . Hepatitis panel, acute    Standing Status:   Future    Number of Occurrences:   1    Standing Expiration Date:   11/25/2019  . Lactate dehydrogenase    Standing Status:   Future    Number of Occurrences:   1    Standing Expiration Date:   11/25/2019    All questions were answered. The patient knows to call the clinic with any problems questions or concerns.  Return of visit: 2 weeks Thank you for this kind referral and the opportunity to participate in the care of this patient. A copy of today's note is routed to referring provider  Total face to face encounter time for this patient visit was 60 min. >50% of the time was  spent in counseling and coordination of care.    Rickard Patience, MD, PhD Hematology Oncology [facility] Pager- [phone] 11/25/2018

## 2018-11-26 ENCOUNTER — Inpatient Hospital Stay: Payer: BLUE CROSS/BLUE SHIELD

## 2018-11-26 DIAGNOSIS — D7589 Other specified diseases of blood and blood-forming organs: Secondary | ICD-10-CM | POA: Diagnosis not present

## 2018-11-26 DIAGNOSIS — E8809 Other disorders of plasma-protein metabolism, not elsewhere classified: Secondary | ICD-10-CM

## 2018-11-26 LAB — URINALYSIS, COMPLETE (UACMP) WITH MICROSCOPIC
Bacteria, UA: NONE SEEN
Bilirubin Urine: NEGATIVE
Glucose, UA: NEGATIVE mg/dL
Hgb urine dipstick: NEGATIVE
Ketones, ur: NEGATIVE mg/dL
Leukocytes, UA: NEGATIVE
Nitrite: NEGATIVE
Protein, ur: NEGATIVE mg/dL
Specific Gravity, Urine: 1.018 (ref 1.005–1.030)
Squamous Epithelial / HPF: NONE SEEN (ref 0–5)
pH: 5 (ref 5.0–8.0)

## 2018-11-29 LAB — FACTOR 5 LEIDEN

## 2018-11-30 LAB — PROTHROMBIN GENE MUTATION

## 2018-12-01 ENCOUNTER — Ambulatory Visit
Admission: RE | Admit: 2018-12-01 | Discharge: 2018-12-01 | Disposition: A | Discharge status: Home / Self Care | Payer: BLUE CROSS/BLUE SHIELD | Source: Ambulatory Visit | Attending: Oncology | Admitting: Oncology

## 2018-12-01 DIAGNOSIS — R740 Nonspecific elevation of levels of transaminase and lactic acid dehydrogenase [LDH]: Secondary | ICD-10-CM | POA: Diagnosis present

## 2018-12-01 DIAGNOSIS — E8809 Other disorders of plasma-protein metabolism, not elsewhere classified: Secondary | ICD-10-CM | POA: Diagnosis present

## 2018-12-01 DIAGNOSIS — R7401 Elevation of levels of liver transaminase levels: Secondary | ICD-10-CM

## 2018-12-08 ENCOUNTER — Other Ambulatory Visit: Payer: Self-pay

## 2018-12-08 ENCOUNTER — Encounter: Payer: Self-pay | Admitting: Oncology

## 2018-12-08 ENCOUNTER — Inpatient Hospital Stay: Payer: BLUE CROSS/BLUE SHIELD | Admitting: Oncology

## 2018-12-08 VITALS — BP 118/82 | HR 61 | Temp 96.4°F | Resp 18 | Wt 157.9 lb

## 2018-12-08 DIAGNOSIS — E46 Unspecified protein-calorie malnutrition: Secondary | ICD-10-CM

## 2018-12-08 DIAGNOSIS — D6851 Activated protein C resistance: Secondary | ICD-10-CM

## 2018-12-08 DIAGNOSIS — R740 Nonspecific elevation of levels of transaminase and lactic acid dehydrogenase [LDH]: Secondary | ICD-10-CM

## 2018-12-08 DIAGNOSIS — Z79899 Other long term (current) drug therapy: Secondary | ICD-10-CM

## 2018-12-08 DIAGNOSIS — Z87891 Personal history of nicotine dependence: Secondary | ICD-10-CM

## 2018-12-08 DIAGNOSIS — R600 Localized edema: Secondary | ICD-10-CM | POA: Diagnosis not present

## 2018-12-08 DIAGNOSIS — Z9884 Bariatric surgery status: Secondary | ICD-10-CM

## 2018-12-08 DIAGNOSIS — Z86718 Personal history of other venous thrombosis and embolism: Secondary | ICD-10-CM

## 2018-12-08 DIAGNOSIS — D7589 Other specified diseases of blood and blood-forming organs: Secondary | ICD-10-CM

## 2018-12-08 DIAGNOSIS — I8290 Acute embolism and thrombosis of unspecified vein: Secondary | ICD-10-CM

## 2018-12-08 DIAGNOSIS — R748 Abnormal levels of other serum enzymes: Secondary | ICD-10-CM

## 2018-12-08 DIAGNOSIS — I82412 Acute embolism and thrombosis of left femoral vein: Secondary | ICD-10-CM

## 2018-12-08 DIAGNOSIS — E8809 Other disorders of plasma-protein metabolism, not elsewhere classified: Secondary | ICD-10-CM

## 2018-12-08 MED ORDER — VITAMIN B-12 500 MCG PO TABS: 500.0000 ug | ORAL_TABLET | Freq: Every day | ORAL | 3 refills | Status: DC

## 2018-12-08 NOTE — Progress Notes (Signed)
Patient here for follow up. Pt states he feel "weak and tired" all the time.

## 2018-12-08 NOTE — Progress Notes (Signed)
Hematology/Oncology Consult note [facility] [phone] [phone]   Patient Care Team: Retia Passe, NP as PCP - General (Nurse Practitioner)  REFERRING PROVIDER: Chodri CHIEF COMPLAINTS/REASON FOR VISIT:  Evaluation of abnormal labs  HISTORY OF PRESENTING ILLNESS:  Thomas Huerta is a  63 y.o.  male with PMH listed below who was referred to me for evaluation of abnormal labs In October 2019, patient presented with complaints of 6 weeks history of bilateral leg swelling, left worse than right.  Ultrasound was obtained which showed DVT throughout the left lower extremity extending up to the level of common femoral vein. Patient was placed on  Eliquis with partial improvement of lower extremity swelling.  He was evaluated by Dr. Darrick Penna vascular surgery at that time.  09/10/2018 patient underwent left lower extremity venous thrombolysis and iliac stent. Presented to emergency room on 11/11/2018 for evaluation of worsening bilateral lower extremity swelling with pain in the left leg, .  Also developed left arm swelling.  Patient reported being compliant with Eliquis.   Duplex suggested No evidence of thrombus involving IVC.  Patent bilateral common iliac vein and right external iliac vein stent.  Acute superficial vein thrombosis involving left basilic vein. Left internal jugular vein acute deep vein thrombosis. Patient was evaluated by vascular surgery and recommended switch Eliquis to Xarelto. patient reports swelling of left arm has not improved.  Still has lower extremity swelling. Lab work-up showed low albumin at 2.1, mild transaminitis with elevated AST and ALT, hemoglobin 12.7, MCV 102 Patient was referred to hematology for further evaluation. Denies any previous personal history of thrombosis.   Family history positive for mother deceased from lymphoma and father deceased from colon cancer.    He has a history of gastric bypass in  2004.  He takes multivitamin daily.  Denies any contributing immobilization factors prior to thrombosis events. Reports feeling tired.  Denies night sweating, fever or chills.  INTERVAL HISTORY Thomas Huerta is a 63 y.o. male who has above history reviewed by me today presents for follow up visit for management of DVT.  Problems and complaints are listed below: He continues to feel tired and fatigued. Had lab and image work up done during the interval,  He presents to discuss results.    MEDICAL HISTORY:  Past Medical History:  Diagnosis Date  . Back pain    chronic  . Complication of anesthesia    over medicated with Fentanyl  . History of kidney stones    5th time treating stones in 15 years  . Obesity    history of prior to gastric bypass    SURGICAL HISTORY: Past Surgical History:  Procedure Laterality Date  . ANGIOPLASTY ILLIAC ARTERY Left 09/11/2018   Procedure: BALLOON ANGIOPLASTY LEFT ILIAC VEIN;  Surgeon: Cephus Shelling, MD;  Location: [facility] OR;  Service: Vascular;  Laterality: Left;  . CYST EXCISION     left lower back beside the spine  . CYSTOSCOPY    . CYSTOSCOPY W/ URETERAL STENT REMOVAL Left 08/18/2017   Procedure: CYSTOSCOPY WITH STENT REMOVAL;  Surgeon: Orson Ape, MD;  Location: [facility] ORS;  Service: Urology;  Laterality: Left;  . CYSTOSCOPY WITH STENT PLACEMENT Left 06/16/2017   Procedure: CYSTOSCOPY WITH STENT PLACEMENT;  Surgeon: Orson Ape, MD;  Location: [facility] ORS;  Service: Urology;  Laterality: Left;  . EXTRACORPOREAL SHOCK WAVE LITHOTRIPSY Left 06/25/2017   Procedure: EXTRACORPOREAL SHOCK WAVE LITHOTRIPSY (ESWL);  Surgeon: Orson Ape, MD;  Location:  [facility] ORS;  Service: Urology;  Laterality: Left;  . EXTRACORPOREAL SHOCK WAVE LITHOTRIPSY Left 08/06/2017   Procedure: EXTRACORPOREAL SHOCK WAVE LITHOTRIPSY (ESWL);  Surgeon: Orson ApeWolff, Michael R, MD;  Location: [facility] ORS;  Service: Urology;  Laterality: Left;  . EXTRACORPOREAL SHOCK WAVE  LITHOTRIPSY Right 11/05/2017   Procedure: EXTRACORPOREAL SHOCK WAVE LITHOTRIPSY (ESWL);  Surgeon: Orson ApeWolff, Michael R, MD;  Location: [facility] ORS;  Service: Urology;  Laterality: Right;  . GASTRIC BYPASS  2004  . HERNIA REPAIR Bilateral    inguinal hernias  . INSERTION OF ILIAC STENT Left 09/11/2018   Procedure: INSERTION OF LEFT ILIAC STENT;  Surgeon: Cephus Shellinglark, Christopher J, MD;  [facility];  Service: Vascular;  Laterality: Left;  . INTRAVASCULAR ULTRASOUND/IVUS N/A 09/11/2018   Procedure: INTRAVASCULAR ULTRASOUND/IVUS;  Surgeon: Cephus Shellinglark, Christopher J, MD;  [facility];  Service: Vascular;  Laterality: N/A;  . LOWER EXTREMITY VENOGRAPHY Left 09/10/2018   Procedure: LOWER EXTREMITY VENOGRAPHY;  Surgeon: Sherren KernsFields, Charles E, MD;  Location: [facility] INVASIVE CV LAB;  Service: Cardiovascular;  Laterality: Left;  . PERIPHERAL VASCULAR THROMBECTOMY Left 09/10/2018   Procedure: PERIPHERAL VASCULAR THROMBECTOMY;  Surgeon: Sherren KernsFields, Charles E, MD;  Location: [facility] INVASIVE CV LAB;  Service: Cardiovascular;  Laterality: Left;  lower extremity venous system.  . TONSILLECTOMY     and addenoids  . TONSILLECTOMY AND ADENOIDECTOMY    . URETEROSCOPY WITH HOLMIUM LASER LITHOTRIPSY Left 06/16/2017   Procedure: URETEROSCOPY WITH HOLMIUM LASER LITHOTRIPSY;  Surgeon: Orson ApeWolff, Michael R, MD;  Location: [facility] ORS;  Service: Urology;  Laterality: Left;  Marland Kitchen. VENOGRAM Left 09/11/2018   Procedure: VENOGRAM LEFT LEG;  Surgeon: Cephus Shellinglark, Christopher J, MD;  [facility];  Service: Vascular;  Laterality: Left;    SOCIAL HISTORY: Social History   Socioeconomic History  . Marital status: Married    Spouse name: Not on file  . Number of children: Not on file  . Years of education: Not on file  . Highest education level: Not on file  Occupational History  . Not on file  Social Needs  . Financial resource strain: Not on file  . Food insecurity:    Worry: Not on file    Inability: Not on file  . Transportation needs:    Medical:  Not on file    Non-medical: Not on file  Tobacco Use  . Smoking status: Former Smoker    Packs/day: 1.00    Types: Cigarettes    Last attempt to quit: 11/01/2018    Years since quitting: 0.1  . Smokeless tobacco: Never Used  Substance and Sexual Activity  . Alcohol use: No  . Drug use: Yes    Types: Oxycodone    Comment: past history age 63 with perscription Percocet  . Sexual activity: Not on file  Lifestyle  . Physical activity:    Days per week: Not on file    Minutes per session: Not on file  . Stress: Not on file  Relationships  . Social connections:    Talks on phone: Not on file    Gets together: Not on file    Attends religious service: Not on file    Active member of club or organization: Not on file    Attends meetings of clubs or organizations: Not on file    Relationship status: Not on file  . Intimate partner violence:    Fear of current or ex partner: Not on file    Emotionally abused: Not on file    Physically abused: Not on file  Forced sexual activity: Not on file  Other Topics Concern  . Not on file  Social History Narrative  . Not on file    FAMILY HISTORY: Family History  Problem Relation Age of Onset  . Lymphoma Mother   . Colon cancer Father     ALLERGIES:  is allergic to fentanyl.  MEDICATIONS:  Current Outpatient Medications  Medication Sig Dispense Refill  . acetaminophen (TYLENOL) 500 MG tablet Take 1,000 mg by mouth every 6 (six) hours as needed (for pain.).    Marland Kitchen BELSOMRA 5 MG TABS Take 5 mg by mouth at bedtime as needed for sleep.  0  . Buprenorphine HCl-Naloxone HCl 4-1 MG FILM PLACE 1 TABLET UNDER THE TONGUE AND LET DISSOVLE 2 TIMES DAILY.    . Multiple Vitamins-Minerals (MULTIVITAMIN WITH MINERALS) tablet Take 1 tablet by mouth daily.    Marland Kitchen PROAIR HFA 108 (90 Base) MCG/ACT inhaler Inhale 2 puffs into the lungs every 6 (six) hours as needed for wheezing or shortness of breath.  0  . Rivaroxaban 15 & 20 MG TBPK Take as directed on  package: Start with one 15mg  tablet by mouth twice a day with food. On Day 22, switch to one 20mg  tablet once a day with food. 51 each 0   No current facility-administered medications for this visit.      PHYSICAL EXAMINATION: ECOG PERFORMANCE STATUS: 1 - Symptomatic but completely ambulatory Vitals:   12/08/18 1350  BP: 118/82  Pulse: 61  Resp: 18  Temp: (!) 96.4 F (35.8 C)   Filed Weights   12/08/18 1350  Weight: 157 lb 14.4 oz (71.6 kg)    Physical Exam Constitutional:      General: He is not in acute distress. HENT:     Head: Normocephalic and atraumatic.  Eyes:     General: No scleral icterus.    Pupils: Pupils are equal, round, and reactive to light.  Neck:     Musculoskeletal: Normal range of motion and neck supple.  Cardiovascular:     Rate and Rhythm: Normal rate and regular rhythm.     Heart sounds: Normal heart sounds.  Pulmonary:     Effort: Pulmonary effort is normal. No respiratory distress.     Breath sounds: No wheezing.  Abdominal:     General: Bowel sounds are normal. There is no distension.     Palpations: Abdomen is soft. There is no mass.     Tenderness: There is no abdominal tenderness.  Musculoskeletal: Normal range of motion.        General: Swelling present. No deformity.     Comments: Bilateral lower extremity 2+ edema  Skin:    General: Skin is warm and dry.     Findings: No erythema or rash.  Neurological:     Mental Status: He is alert and oriented to person, place, and time.     Cranial Nerves: No cranial nerve deficit.     Coordination: Coordination normal.  Psychiatric:        Behavior: Behavior normal.        Thought Content: Thought content normal.      LABORATORY DATA:  I have reviewed the data as listed Lab Results  Component Value Date   WBC 5.0 11/24/2018   HGB 12.7 (L) 11/24/2018   HCT 37.5 (L) 11/24/2018   MCV 100.3 (H) 11/24/2018   PLT 237 11/24/2018   Recent Labs    09/11/18 0546 11/11/18 1445  11/24/18 1623  NA 139 141 141  K 3.8 3.9 3.7  CL 110 112* 110  CO2 25 23 26   GLUCOSE 86 80 70  BUN 7 13 20   CREATININE 0.73 1.17 0.93  CALCIUM 7.5* 7.7* 7.5*  GFRNONAA >60 >60 >60  GFRAA >60 >60 >60  PROT  --  4.2* 4.3*  ALBUMIN  --  2.1* 2.1*  AST  --  46* 38  ALT  --  57* 44  ALKPHOS  --  135* 159*  BILITOT  --  0.7 0.6   Iron/TIBC/Ferritin/ %Sat No results found for: IRON, TIBC, FERRITIN, IRONPCTSAT   RADIOGRAPHIC STUDIES: I have personally reviewed the radiological images as listed and agreed with the findings in the report. 06/11/2017 CT abdomen pelvis without contrast Bilateral nonobstructing nephro lithiasis including a 12 mm stone within the left renal collecting system.  2.6 elongated stone within the distal left ureter. Aortic atherosclerosis. Bilateral pulmonary nodules measuring up to 6 mm. 08/09/2018 CT chest without contrast-[facility] radiology No suspicious lung nodule or mass were seen.  Small nodules are present bilaterally of no more than 6 mm in diameter.  Slight prominence of the ascending thoracic aorta measuring 39 mm in diameter.  Mild thoracic aortic atherosclerosis.  Some stranding in the soft tissue of the left upper quadrant adjacent to the splenic fracture of colon of questionable significance. 10/03/2018 CT head without contrast No acute intracranial abnormality noted. 11/11/2018 chest x-ray two-view Small bilateral pleural effusion.  The nodular density seen on the prior CT is not well appreciated on today's exam.  12/01/2018 US abdomen complete.  1. Small volume ascites.  Small right pleural effusion. 2. Stable appearance of the left renal subcapsular fluid collection since 08/31/2018. This may reflect chronic hematoma  ASSESSMENT & PLAN:  1. Acute deep vein thrombosis (DVT) of femoral vein of left lower extremity (HCC)   2. History of gastric bypass   3. Hypoalbuminemia due to protein-calorie malnutrition (HCC)   4. Elevated alkaline  phosphatase level   5. Acute deep vein thrombosis (DVT) of non-extremity vein   6. Factor 5 Leiden mutation, heterozygous [facility])    Labs were reviewed and discussed.[place] image was independently reviewed and discussed with patient.  Heterozygous factor V leiden mutation was discussed, which increases his risk of first thrombosis event Approximately 2-4 folds. Presence of heterozygous mutation has similar recurrent thrombosis risks to general population. Given that his DVT is unprovoked, recommend long term anticoagulation.  Continue Xalreto 20mg  daily.   Hypoalbuminemia, etiology unknown.  Differential diagnosis includes nephrotic syndrome, malabsorption or liver disease.  UA is negative for proteinuria make nephrotic syndrome less likely. Ultrasound abdomen showed small volume of ascites.  No echogenic abnormality of liver  #Elevated alkaline phosphatase, decreased albumin, ascites.  Will refer to gastroenterology for further evaluation.  Labs and recent images including CT and x-ray were independently reviewed by me and discussed with patient. #Unprovoked recurrent deep venous thrombosis. Recommend long-term anticoagulation.  Tolerates Xarelto.  Continue. I will obtain hypercoagulable work-up.  I will start with obtaining factor V Leiden mutation, prothrombin gene mutation. I will hold off obtaining antiphospholipid panel, protein C and S levels in the acute setting as test can be falsely positive.  #History of gastric bypass may contribute to low albumin due to malabsorption. Adequate folate level.  Vitamin B12 490 Advised patient to take oral vitamin B12 supplementation 500 MCG to 1000 MCG daily. He prefers to buy OTC.  Repeat vitamin B12 in 3 months.  If trending down, will start parenteral vitamin B12 supplementation  Orders Placed This Encounter  Procedures  . Iron and TIBC    Standing Status:   Future    Standing Expiration Date:   12/09/2019  . CBC with Differential/Platelet     Standing Status:   Future    Standing Expiration Date:   12/09/2019  . Ferritin    Standing Status:   Future    Standing Expiration Date:   12/09/2019  . Vitamin B12    Standing Status:   Future    Standing Expiration Date:   04/08/2019  . Comprehensive metabolic panel    Standing Status:   Future    Standing Expiration Date:   12/09/2019  . Amb Referral to Nutrition and Diabetic E    Referral Priority:   Routine    Referral Type:   Consultation    Referral Reason:   Specialty Services Required    Number of Visits Requested:   1  . Ambulatory referral to Gastroenterology    Referral Priority:   Routine    Referral Type:   Consultation    Referral Reason:   Specialty Services Required    Referred to Provider:   Toney ReilVanga, Rohini Reddy, MD    Number of Visits Requested:   1    All questions were answered. The patient knows to call the clinic with any problems questions or concerns.  Return of visit: 3 months.    Rickard PatienceZhou Gearald Stonebraker, MD, PhD Hematology Oncology [facility] Pager- [phone] 12/08/2018

## 2018-12-09 ENCOUNTER — Other Ambulatory Visit: Payer: Self-pay | Admitting: Gastroenterology

## 2018-12-09 ENCOUNTER — Ambulatory Visit: Payer: BLUE CROSS/BLUE SHIELD | Admitting: Gastroenterology

## 2018-12-09 ENCOUNTER — Other Ambulatory Visit: Payer: Self-pay

## 2018-12-09 ENCOUNTER — Encounter: Payer: Self-pay | Admitting: Gastroenterology

## 2018-12-09 VITALS — BP 131/92 | HR 60 | Resp 17 | Ht 69.0 in | Wt 156.6 lb

## 2018-12-09 DIAGNOSIS — E43 Unspecified severe protein-calorie malnutrition: Secondary | ICD-10-CM

## 2018-12-09 DIAGNOSIS — R748 Abnormal levels of other serum enzymes: Secondary | ICD-10-CM | POA: Diagnosis not present

## 2018-12-09 DIAGNOSIS — Z9884 Bariatric surgery status: Secondary | ICD-10-CM | POA: Diagnosis not present

## 2018-12-09 NOTE — Progress Notes (Signed)
Arlyss Repress, MD [address]  [address]  [place], [place] [address]  Main: [phone]  Fax: [phone]    Gastroenterology Consultation  Referring Provider:     Rickard Patience, MD Primary Care Physician:  Retia Passe, NP Primary Gastroenterologist:  Dr. Arlyss Repress Reason for Consultation:     Protein calorie malnutrition, severe hypoalbuminemia        HPI:   Thomas Huerta is a 63 y.o. Caucasian male referred by Dr. Retia Passe, NP  for consultation & management of severe hypoalbuminemia.  Patient underwent mini gastric bypass by Dr. Antony Madura in 2004 at [facility]. He did not have follow-up since his bypass last 15 years as Dr. Antony Madura moved to [place]. Patient's pre-bypass weight was 325lbs on 02/27/2003, post bypass, his weight was 176 pounds as of 11/22/2003.  For the last 2 years he has been maintaining weight between 150 and 156 pounds.    In spring last year, patient noticed generalized weakness, swelling of legs, progressively worse and was found to have deep vein thrombosis throughout the left leg extending up to the common femoral vein.  He was started on Eliquis.  He has significant weakness in his lower extremities, feels tired after walking for 30 minutes.  He underwent thrombectomy of the left popliteal, femoral vein, thrombolysis.  He has been progressively weak, swelling in his hands as well as scrotum.  He was recently evaluated by hematologist Dr. Cathie Hoops for new onset of DVT.  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  He is found to have severe hypoalbuminemia, 2.1, total protein 4.3.  No evidence of proteinuria, no evidence of cirrhosis or heterogeneity of the liver.  Mild ascites, no thrombocytopenia.  He has macrocytic anemia, on daily multivitamin, found to have normal B12 and folate levels.  Unknown ferritin and other micronutrient levels.  TSH normal.  He is taking  daily multivitamin with minerals, B12.  Patient is referred to GI for further evaluation of protein calorie malnutrition in setting of gastric bypass He feels fatigued all day.  He reports that his appetite is good and eating 3 times a day along with protein shakes.  He reports having regular bowel movements, denies steatorrhea or diarrhea. He denies abdominal pain.  Reports tenderness in his right anterior lower rib cage from bruise few days ago.  Reports pain in this area during inspiration  He does smoke cigarettes, 1 pack lasts for 2 to 3 days Denies drinking alcohol  NSAIDs: None  Antiplts/Anticoagulants/Anti thrombotics: Eliquis for left lower extremity DVT  GI Procedures: Reports having colonoscopy every 5 years in [place] and reportedly normal Denies having an EGD in the past  Past Medical History:  Diagnosis Date  . Back pain    chronic  . Complication of anesthesia    over medicated with Fentanyl  . History of kidney stones    5th time treating stones in 15 years  . Obesity    history of prior to gastric bypass    Past Surgical History:  Procedure Laterality Date  . ANGIOPLASTY ILLIAC ARTERY Left 09/11/2018   Procedure: BALLOON ANGIOPLASTY LEFT ILIAC VEIN;  Surgeon: Cephus Shelling, MD;  Location: [facility] OR;  Service: Vascular;  Laterality: Left;  . CYST EXCISION     left lower back beside the spine  . CYSTOSCOPY    . CYSTOSCOPY W/ URETERAL STENT REMOVAL Left 08/18/2017   Procedure: CYSTOSCOPY  WITH STENT REMOVAL;  Surgeon: Orson ApeWolff, Michael R, MD;  Location: [facility] ORS;  Service: Urology;  Laterality: Left;  . CYSTOSCOPY WITH STENT PLACEMENT Left 06/16/2017   Procedure: CYSTOSCOPY WITH STENT PLACEMENT;  Surgeon: Orson ApeWolff, Michael R, MD;  Location: [facility] ORS;  Service: Urology;  Laterality: Left;  . EXTRACORPOREAL SHOCK WAVE LITHOTRIPSY Left 06/25/2017   Procedure: EXTRACORPOREAL SHOCK WAVE LITHOTRIPSY (ESWL);  Surgeon: Orson ApeWolff, Michael R, MD;  Location: [facility] ORS;  Service:  Urology;  Laterality: Left;  . EXTRACORPOREAL SHOCK WAVE LITHOTRIPSY Left 08/06/2017   Procedure: EXTRACORPOREAL SHOCK WAVE LITHOTRIPSY (ESWL);  Surgeon: Orson ApeWolff, Michael R, MD;  Location: [facility] ORS;  Service: Urology;  Laterality: Left;  . EXTRACORPOREAL SHOCK WAVE LITHOTRIPSY Right 11/05/2017   Procedure: EXTRACORPOREAL SHOCK WAVE LITHOTRIPSY (ESWL);  Surgeon: Orson ApeWolff, Michael R, MD;  Location: [facility] ORS;  Service: Urology;  Laterality: Right;  . GASTRIC BYPASS  2004  . HERNIA REPAIR Bilateral    inguinal hernias  . INSERTION OF ILIAC STENT Left 09/11/2018   Procedure: INSERTION OF LEFT ILIAC STENT;  Surgeon: Cephus Shellinglark, Christopher J, MD;  [facility];  Service: Vascular;  Laterality: Left;  . INTRAVASCULAR ULTRASOUND/IVUS N/A 09/11/2018   Procedure: INTRAVASCULAR ULTRASOUND/IVUS;  Surgeon: Cephus Shellinglark, Christopher J, MD;  [facility];  Service: Vascular;  Laterality: N/A;  . LOWER EXTREMITY VENOGRAPHY Left 09/10/2018   Procedure: LOWER EXTREMITY VENOGRAPHY;  Surgeon: Sherren KernsFields, Charles E, MD;  Location: [facility] INV[facility]IVE [facility] LAB;  Service: Cardiovascular;  Laterality: Left;  . PERIPHERAL VASCULAR THROMBECTOMY Left 09/10/2018   Procedure: PERIPHERAL VASCULAR THROMBECTOMY;  Surgeon: Sherren KernsFields, Charles E, MD;  Location: [facility] INVAS[facility] LAB;  Service: Cardiovascular;  Laterality: Left;  lower extremity venous system.  . TONSILLECTOMY     and addenoids  . TONSILLECTOMY AND ADENOIDECTOMY    . URETEROSCOPY WITH HOLMIUM LASER LITHOTRIPSY Left 06/16/2017   Procedure: URETEROSCOPY WITH HOLMIUM LASER LITHOTRIPSY;  Surgeon: Orson ApeWolff, Michael R, MD;  Location: [facility] ORS;  Service: Urology;  Laterality: Left;  Marland Kitchen. VENOGRAM Left 09/11/2018   Procedure: VENOGRAM LEFT LEG;  Surgeon: Cephus Shellinglark, Christopher J, MD;  [facility];  Service: Vascular;  Laterality: Left;    Current Outpatient Medications:  .  acetaminophen (TYLENOL) 500 MG tablet, Take 1,000 mg by mouth every 6 (six) hours as needed (for pain.)., Disp: , Rfl:  .  BELSOMRA  5 MG TABS, Take 5 mg by mouth at bedtime as needed for sleep., Disp: , Rfl: 0 .  Buprenorphine HCl-Naloxone HCl 4-1 MG FILM, PLACE 1 TABLET UNDER THE TONGUE AND LET DISSOVLE 2 TIMES DAILY., Disp: , Rfl:  .  Multiple Vitamins-Minerals (MULTIVITAMIN WITH MINERALS) tablet, Take 1 tablet by mouth daily., Disp: , Rfl:  .  PROAIR HFA 108 (90 Base) MCG/ACT inhaler, Inhale 2 puffs into the lungs every 6 (six) hours as needed for wheezing or shortness of breath., Disp: , Rfl: 0 .  vitamin B-12 (CYANOCOBALAMIN) 500 MCG tablet, Take 1 tablet (500 mcg total) by mouth daily., Disp: 90 tablet, Rfl: 3 .  Rivaroxaban 15 & 20 MG TBPK, Take as directed on package: Start with one 15mg  tablet by mouth twice a day with food. On Day 22, switch to one 20mg  tablet once a day with food. (Patient not taking: Reported on 12/09/2018), Disp: 51 each, Rfl: 0   Family History  Problem Relation Age of Onset  . Lymphoma Mother   . Colon cancer Father      Social History   Tobacco Use  . Smoking status: Former Smoker  Packs/day: 1.00    Types: Cigarettes    Last attempt to quit: 11/01/2018    Years since quitting: 0.1  . Smokeless tobacco: Never Used  Substance Use Topics  . Alcohol use: No  . Drug use: Yes    Types: Oxycodone    Comment: past history age 10 with perscription Percocet    Allergies as of 12/09/2018 - Review Complete 12/09/2018  Allergen Reaction Noted  . Fentanyl Other (See Comments) 11/05/2017    Review of Systems:    All systems reviewed and negative except where noted in HPI.   Physical Exam:  BP (!) 131/92 (BP Location: Left Arm, Patient Position: Sitting, Cuff Size: Normal)   Pulse 60   Resp 17   Ht 5\' 9"  (1.753 m)   Wt 156 lb 9.6 oz (71 kg)   BMI 23.13 kg/m  No LMP for male patient.  General:   Alert,  Well-developed, well-nourished, pleasant and cooperative in NAD Head:  Normocephalic and atraumatic. Eyes:  Sclera clear, no icterus.   Conjunctiva pink. Ears:  Normal auditory  acuity. Nose:  No deformity, discharge, or lesions. Mouth:  No deformity or lesions,oropharynx pink & moist. Neck:  Supple; no masses or thyromegaly. Lungs:  Respirations even and unlabored.  Clear throughout to auscultation.   No wheezes, crackles, or rhonchi. No acute distress. Heart:  Regular rate and rhythm; no murmurs, clicks, rubs, or gallops. Abdomen:  Normal bowel sounds. Soft, non-tender and non-distended without masses, hepatosplenomegaly or hernias noted.  No guarding or rebound tenderness.   Rectal: Not performed Msk:  Symmetrical without gross deformities. Good, equal movement & strength bilaterally. Pulses:  Normal pulses noted. Extremities:  No clubbing or edema.  No cyanosis. Neurologic:  Alert and oriented x3;  grossly normal neurologically. Skin:  Intact without significant lesions or rashes. No jaundice. Psych:  Alert and cooperative. Normal mood and affect.  Imaging Studies: Reviewed  Assessment and Plan:   Thomas Huerta is a 63 y.o. Caucasian male with tobacco use, mini gastric bypass in 2004, extensive left lower extremity DVT in 08/2018 status post angioplasty, thrombectomy, thrombolysis, on Eliquis with moderate to severe protein calorie malnutrition in the setting of gastric bypass.  Mini gastric bypass is a combination of sleeve gastrectomy and gastrojejunal anastomosis which leads to both restrictive and malabsorptive process.  Patient did not have regular post bypass follow-up.  I suspect that his hypoalbuminemia is probably secondary to malabsorption from mini gastric bypass.  Other differentials include upper GI malignancy, celiac disease, anastomotic ulcer.  He may have micronutrient deficiencies  Imaging studies revealed mildly atrophic pancreas, normal liver. 2D echo in 07/2018 was normal.  EF greater than 55%, normal LV function, no valvular abnormalities  My recommendations are Upper GI with small bowel series EGD with biopsies Check vitamin A, D, E  and K, thiamine, vitamin C, ferritin, zinc, copper, selenium and manganese levels Continue multivitamins with minerals and daily B12 Continue Eliquis due to recent onset of DVT.  He does not need to hold Eliquis to undergo EGD  Elevated alkaline phosphatase Ultrasound and CT revealed normal intrahepatic and extrahepatic biliary ducts Normal liver Check serum GGT, isoenzymes   Follow up in 2 to 3 weeks after above work-up   Arlyss Repress, MD

## 2018-12-10 ENCOUNTER — Ambulatory Visit: Admission: RE | Admit: 2018-12-10 | Payer: BLUE CROSS/BLUE SHIELD | Source: Ambulatory Visit

## 2018-12-10 ENCOUNTER — Telehealth: Payer: Self-pay | Admitting: Gastroenterology

## 2018-12-10 LAB — MANGANESE, WHOLE BLOOD: Manganese, Blood: 11.1 ug/L (ref 8.0–18.7)

## 2018-12-10 NOTE — Telephone Encounter (Signed)
Angie with Radiology called stating she called patient & ask him to come in today for an Upper Gi with small bowel follow thru. The patient stated he has another appointment & if he needed it he would call to r/s. Before she could say anything he told her to have a good day then hung up.

## 2018-12-13 ENCOUNTER — Other Ambulatory Visit: Payer: Self-pay

## 2018-12-13 DIAGNOSIS — E43 Unspecified severe protein-calorie malnutrition: Secondary | ICD-10-CM

## 2018-12-13 DIAGNOSIS — Z9884 Bariatric surgery status: Secondary | ICD-10-CM

## 2018-12-13 LAB — ALKALINE PHOSPHATASE, ISOENZYMES
Alkaline Phosphatase: 181 IU/L — ABNORMAL HIGH (ref 39–117)
BONE FRACTION: 34 % (ref 12–68)
INTESTINAL FRAC.: 0 % (ref 0–18)
LIVER FRACTION: 66 % (ref 13–88)

## 2018-12-13 LAB — SELENIUM SERUM: Selenium, S/P: 124 ug/L (ref 91–198)

## 2018-12-13 LAB — GAMMA GT: GGT: 64 IU/L (ref 0–65)

## 2018-12-13 LAB — COPPER, SERUM: Copper: 70 ug/dL — ABNORMAL LOW (ref 72–166)

## 2018-12-14 ENCOUNTER — Ambulatory Visit: Payer: BLUE CROSS/BLUE SHIELD | Admitting: Certified Registered"

## 2018-12-14 ENCOUNTER — Ambulatory Visit
Admission: RE | Admit: 2018-12-14 | Discharge: 2018-12-14 | Disposition: A | Discharge status: Home / Self Care | Payer: BLUE CROSS/BLUE SHIELD | Source: Ambulatory Visit | Attending: Gastroenterology | Admitting: Gastroenterology

## 2018-12-14 ENCOUNTER — Encounter
Admission: RE | Disposition: A | Discharge status: Home / Self Care | Payer: Self-pay | Source: Ambulatory Visit | Attending: Gastroenterology

## 2018-12-14 DIAGNOSIS — Z87891 Personal history of nicotine dependence: Secondary | ICD-10-CM | POA: Insufficient documentation

## 2018-12-14 DIAGNOSIS — Z95820 Peripheral vascular angioplasty status with implants and grafts: Secondary | ICD-10-CM | POA: Diagnosis not present

## 2018-12-14 DIAGNOSIS — Z885 Allergy status to narcotic agent status: Secondary | ICD-10-CM | POA: Diagnosis not present

## 2018-12-14 DIAGNOSIS — Z7901 Long term (current) use of anticoagulants: Secondary | ICD-10-CM | POA: Insufficient documentation

## 2018-12-14 DIAGNOSIS — E440 Moderate protein-calorie malnutrition: Secondary | ICD-10-CM

## 2018-12-14 DIAGNOSIS — Z9884 Bariatric surgery status: Secondary | ICD-10-CM | POA: Diagnosis not present

## 2018-12-14 DIAGNOSIS — Z79899 Other long term (current) drug therapy: Secondary | ICD-10-CM | POA: Insufficient documentation

## 2018-12-14 DIAGNOSIS — E46 Unspecified protein-calorie malnutrition: Secondary | ICD-10-CM | POA: Diagnosis not present

## 2018-12-14 DIAGNOSIS — K2950 Unspecified chronic gastritis without bleeding: Secondary | ICD-10-CM | POA: Insufficient documentation

## 2018-12-14 DIAGNOSIS — E8809 Other disorders of plasma-protein metabolism, not elsewhere classified: Secondary | ICD-10-CM

## 2018-12-14 DIAGNOSIS — Z87442 Personal history of urinary calculi: Secondary | ICD-10-CM | POA: Insufficient documentation

## 2018-12-14 DIAGNOSIS — Z980 Intestinal bypass and anastomosis status: Secondary | ICD-10-CM | POA: Diagnosis not present

## 2018-12-14 HISTORY — PX: ESOPHAGOGASTRODUODENOSCOPY (EGD) WITH PROPOFOL: SHX5813

## 2018-12-14 LAB — FERRITIN: Ferritin: 103 ng/mL (ref 30–400)

## 2018-12-14 LAB — IRON AND TIBC
Iron Saturation: 74 % — ABNORMAL HIGH (ref 15–55)
Iron: 77 ug/dL (ref 38–169)
Total Iron Binding Capacity: 104 ug/dL — CL (ref 250–450)
UIBC: 27 ug/dL — ABNORMAL LOW (ref 111–343)

## 2018-12-14 LAB — VITAMIN D 25 HYDROXY (VIT D DEFICIENCY, FRACTURES): Vit D, 25-Hydroxy: 25.3 ng/mL — ABNORMAL LOW (ref 30.0–100.0)

## 2018-12-14 SURGERY — ESOPHAGOGASTRODUODENOSCOPY (EGD) WITH PROPOFOL
Anesthesia: General

## 2018-12-14 MED ORDER — SODIUM CHLORIDE 0.9 % IV SOLN
INTRAVENOUS | Status: DC
  Administered 2018-12-14: 14:00:00 via INTRAVENOUS

## 2018-12-14 MED ORDER — PROPOFOL 10 MG/ML IV BOLUS
INTRAVENOUS | Status: DC | PRN
  Administered 2018-12-14: 50 mg via INTRAVENOUS

## 2018-12-14 MED ORDER — PROPOFOL 500 MG/50ML IV EMUL
INTRAVENOUS | Status: DC | PRN
  Administered 2018-12-14: 175 ug/kg/min via INTRAVENOUS

## 2018-12-14 MED ORDER — LIDOCAINE HCL (CARDIAC) PF 100 MG/5ML IV SOSY
PREFILLED_SYRINGE | INTRAVENOUS | Status: DC | PRN
  Administered 2018-12-14: 50 mg via INTRAVENOUS

## 2018-12-14 NOTE — Op Note (Signed)
[facility] Patient Name: Thomas Huerta Procedure Date: 12/14/2018 2:30 PM MRN: [id number] Account #: [id number] Date of Birth: October 24, 1956 Admit Type: Outpatient Age: 63 Room: [facility] ENDO ROOM 4 Gender: Male Note Status: Finalized Procedure:            Upper GI endoscopy Indications:          Status post mini gastric bypass, Protein calorie                        malnutrition Providers:            Lin Landsman MD, MD Referring MD:         Wellington Hampshire. Rose (Referring MD) Medicines:            Propofol per Anesthesia Complications:        No immediate complications. Estimated blood loss:                        Minimal. Procedure:            Pre-Anesthesia Assessment:                       - Prior to the procedure, a History and Physical was                        performed, and patient medications and allergies were                        reviewed. The patient is competent. The risks and                        benefits of the procedure and the sedation options and                        risks were discussed with the patient. All questions                        were answered and informed consent was obtained.                        Patient identification and proposed procedure were                        verified by the physician, the nurse, the                        anesthesiologist, the anesthetist and the technician in                        the pre-procedure area in the procedure room in the                        endoscopy suite. Mental Status Examination: alert and                        oriented. Airway Examination: normal oropharyngeal                        airway and neck mobility. Respiratory Examination:  clear to auscultation. CV Examination: normal.                        Prophylactic Antibiotics: The patient does not require                        prophylactic antibiotics. Prior Anticoagulants: The                   patient has taken Xarelto (rivaroxaban), last dose was                        1 day prior to procedure. ASA Grade Assessment: III - A                        patient with severe systemic disease. After reviewing                        the risks and benefits, the patient was deemed in                        satisfactory condition to undergo the procedure. The                        anesthesia plan was to use monitored anesthesia care                        (MAC). Immediately prior to administration of                        medications, the patient was re-assessed for adequacy                        to receive sedatives. The heart rate, respiratory rate,                        oxygen saturations, blood pressure, adequacy of                        pulmonary ventilation, and response to care were                        monitored throughout the procedure. The physical status                        of the patient was re-assessed after the procedure.                       After obtaining informed consent, the endoscope was                        passed under direct vision. Throughout the procedure,                        the patient's blood pressure, pulse, and oxygen                        saturations were monitored continuously. The Endoscope                        was introduced  through the mouth, and advanced to the                        afferent and efferent jejunal loops. The upper GI                        endoscopy was accomplished without difficulty. The                        patient tolerated the procedure well. Findings:      The gastroesophageal junction and examined esophagus were normal.      Evidence of a mini gastric bypass was found. The gastrojejunal       anastomosis was characterized by healthy appearing mucosa. This was       traversed. The pouch-to-jejunum limb was characterized by healthy       appearing mucosa. The jejunojejunal anastomosis was  characterized by       healthy appearing mucosa, both efferent and afferent limbs had normal       appearing mucosa. The duodenum-to-jejunum limb was not examined as it       could not be reached. Biopsies for histology were taken with a cold       forceps for evaluation of celiac disease.      Normal mucosa was found in the entire examined stomach, significant       vertically reduced lumen from mini gastric bypass. Biopsies were taken       with a cold forceps for histology. Impression:           - Normal gastroesophageal junction and esophagus.                       - s/p Mini gastric bypass with gastrojejunal                        anastomosis characterized by healthy appearing mucosa.                        Biopsied.                       - Normal mucosa was found in the entire stomach.                        Biopsied. Recommendation:       - Await pathology results.                       - Discharge patient to home (with escort).                       - Resume previous diet today.                       - Continue present medications. Procedure Code(s):    --- Professional ---                       [id number], Esophagogastroduodenoscopy, flexible, transoral;                        with biopsy, single or multiple Diagnosis Code(s):    --- Professional ---  Z98.0, Intestinal bypass and anastomosis status                       Z98.84, Bariatric surgery status CPT copyright 2018 American [employer] Association. All rights reserved. The codes documented in this report are preliminary and upon coder review may  be revised to meet current compliance requirements. Dr. Ulyess Mort Lin Landsman MD, MD 12/14/2018 2:55:52 PM This report has been signed electronically. Number of Addenda: 0 Note Initiated On: 12/14/2018 2:30 PM      [facility]

## 2018-12-14 NOTE — Anesthesia Post-op Follow-up Note (Signed)
Anesthesia QCDR form completed.        

## 2018-12-14 NOTE — Transfer of Care (Signed)
Immediate Anesthesia Transfer of Care Note  Patient: Thomas Huerta  Procedure(s) Performed: ESOPHAGOGASTRODUODENOSCOPY (EGD) WITH PROPOFOL (N/A )  Patient Location: Endoscopy Unit  Anesthesia Type:General  Level of Consciousness: awake, alert  and oriented  Airway & Oxygen Therapy: Patient Spontanous Breathing  Post-op Assessment: Report given to RN and Post -op Vital signs reviewed and stable  Post vital signs: Reviewed and stable  Last Vitals:  Vitals Value Taken Time  BP    Temp    Pulse    Resp    SpO2      Last Pain:  Vitals:   12/14/18 1409  TempSrc: Tympanic      Patients Stated Pain Goal: 0 (12/14/18 1409)  Complications: No apparent anesthesia complications

## 2018-12-14 NOTE — H&P (Signed)
Arlyss Repress, MD [address]  [address]  [place], [place] [address]  Main: [phone]  Fax: [phone] Pager: [phone]  Primary Care Physician:  Retia Passe, NP Primary Gastroenterologist:  Dr. Arlyss Repress  Pre-Procedure History & Physical: HPI:  Thomas Huerta is a 63 y.o. male is here for an endoscopy.   Past Medical History:  Diagnosis Date  . Back pain    chronic  . Complication of anesthesia    over medicated with Fentanyl  . History of kidney stones    5th time treating stones in 15 years  . Obesity    history of prior to gastric bypass    Past Surgical History:  Procedure Laterality Date  . ANGIOPLASTY ILLIAC ARTERY Left 09/11/2018   Procedure: BALLOON ANGIOPLASTY LEFT ILIAC VEIN;  Surgeon: Cephus Shelling, MD;  Location: [facility] OR;  Service: Vascular;  Laterality: Left;  . CYST EXCISION     left lower back beside the spine  . CYSTOSCOPY    . CYSTOSCOPY W/ URETERAL STENT REMOVAL Left 08/18/2017   Procedure: CYSTOSCOPY WITH STENT REMOVAL;  Surgeon: Orson Ape, MD;  Location: [facility] ORS;  Service: Urology;  Laterality: Left;  . CYSTOSCOPY WITH STENT PLACEMENT Left 06/16/2017   Procedure: CYSTOSCOPY WITH STENT PLACEMENT;  Surgeon: Orson Ape, MD;  Location: [facility] OR[facility];  Service: Urology;  Laterality: Left;  . EXTRACORPOREAL SHOCK WAVE LITHOTRIPSY Left 06/25/2017   Procedure: EXTRACORPOREAL SHOCK WAVE LITHOTRIPSY (ESWL);  Surgeon: Orson Ape, MD;  Location: [facility] ORS;  Service: Urology;  Laterality: Left;  . EXTRACORPOREAL SHOCK WAVE LITHOTRIPSY Left 08/06/2017   Procedure: EXTRACORPOREAL SHOCK WAVE LITHOTRIPSY (ESWL);  Surgeon: Orson Ape, MD;  Location: [facility] ORS;  Service: Urology;  Laterality: Left;  . EXTRACORPOREAL SHOCK WAVE LITHOTRIPSY Right 11/05/2017   Procedure: EXTRACORPOREAL SHOCK WAVE LITHOTRIPSY (ESWL);  Surgeon: Orson Ape, MD;  Location: [facility] OR[facility];  Service: Urology;  Laterality: Right;  . GASTRIC BYPASS   2004  . HERNIA REPAIR Bilateral    inguinal hernias  . INSERTION OF ILIAC STENT Left 09/11/2018   Procedure: INSERTION OF LEFT ILIAC STENT;  Surgeon: Cephus Shelling, MD;  Location: [facility] OR;  Service: Vascular;  Laterality: Left;  . INTRAVASCULAR ULTRASOUND/IVUS N/A 09/11/2018   Procedure: INTRAVASCULAR ULTRASOUND/IVUS;  Surgeon: Cephus Shelling, MD;  Location: [facility] OR;  Service: Vascular;  Laterality: N/A;  . LOWER EXTREMITY VENOGRAPHY Left 09/10/2018   Procedure: LOWER EXTREMITY VENOGRAPHY;  Surgeon: Sherren Kerns, MD;  Location: [facility];  Service: Cardiovascular;  Laterality: Left;  . PERIPHERAL VASCULAR THROMBECTOMY Left 09/10/2018   Procedure: PERIPHERAL VASCULAR THROMBECTOMY;  Surgeon: Sherren Kerns, MD;  Location: [facility];  Service: Cardiovascular;  Laterality: Left;  lower extremity venous system.  . TONSILLECTOMY     and addenoids  . TONSILLECTOMY AND ADENOIDECTOMY    . URETEROSCOPY WITH HOLMIUM LASER LITHOTRIPSY Left 06/16/2017   Procedure: URETEROSCOPY WITH HOLMIUM LASER LITHOTRIPSY;  Surgeon: Orson Ape, MD;  Location: [facility] ORS;  Service: Urology;  Laterality: Left;  Marland Kitchen VENOGRAM Left 09/11/2018   Procedure: VENOGRAM LEFT LEG;  Surgeon: Cephus Shelling, MD;  Location: [facility] OR;  Service: Vascular;  Laterality: Left;    Prior to Admission medications   Medication Sig Start Date End Date Taking? Authorizing Provider  BELSOMRA 5 MG TABS Take 5 mg by mouth at bedtime as needed for sleep. 08/23/18  Yes [provider]  Multiple Vitamins-Minerals (MULTIVITAMIN WITH MINERALS) tablet  Take 1 tablet by mouth daily.   Yes [provider]  PROAIR HFA 108 (90 Base) MCG/ACT inhaler Inhale 2 puffs into the lungs every 6 (six) hours as needed for wheezing or shortness of breath. 08/20/18  Yes [provider]  vitamin B-12 (CYANOCOBALAMIN) 500 MCG tablet Take 1 tablet (500 mcg total) by mouth daily. 12/08/18  Yes Rickard Patience, MD    acetaminophen (TYLENOL) 500 MG tablet Take 1,000 mg by mouth every 6 (six) hours as needed (for pain.).    [provider]  Buprenorphine HCl-Naloxone HCl 4-1 MG FILM PLACE 1 TABLET UNDER THE TONGUE AND LET DISSOVLE 2 TIMES DAILY. 11/18/18   [provider]  Rivaroxaban 15 & 20 MG TBPK Take as directed on package: Start with one 15mg  tablet by mouth twice a day with food. On Day 22, switch to one 20mg  tablet once a day with food. Patient not taking: Reported on 12/09/2018 11/11/18   Tilden Fossa, MD    Allergies as of 12/10/2018 - Review Complete 12/09/2018  Allergen Reaction Noted  . Fentanyl Other (See Comments) 11/05/2017    Family History  Problem Relation Age of Onset  . Lymphoma Mother   . Colon cancer Father     Social History   Socioeconomic History  . Marital status: Married    Spouse name: Not on file  . Number of children: Not on file  . Years of education: Not on file  . Highest education level: Not on file  Occupational History  . Not on file  Social Needs  . Financial resource strain: Not on file  . Food insecurity:    Worry: Not on file    Inability: Not on file  . Transportation needs:    Medical: Not on file    Non-medical: Not on file  Tobacco Use  . Smoking status: Former Smoker    Packs/day: 1.00    Types: Cigarettes    Last attempt to quit: 11/01/2018    Years since quitting: 0.1  . Smokeless tobacco: Never Used  Substance and Sexual Activity  . Alcohol use: No  . Drug use: Yes    Types: Oxycodone    Comment: past history age 28 with perscription Percocet  . Sexual activity: Not on file  Lifestyle  . Physical activity:    Days per week: Not on file    Minutes per session: Not on file  . Stress: Not on file  Relationships  . Social connections:    Talks on phone: Not on file    Gets together: Not on file    Attends religious service: Not on file    Active member of club or organization: Not on file    Attends meetings  of clubs or organizations: Not on file    Relationship status: Not on file  . Intimate partner violence:    Fear of current or ex partner: Not on file    Emotionally abused: Not on file    Physically abused: Not on file    Forced sexual activity: Not on file  Other Topics Concern  . Not on file  Social History Narrative  . Not on file    Review of Systems: See HPI, otherwise negative ROS  Physical Exam: BP 114/86   Pulse 60   Temp (!) 97.1 F (36.2 C) (Tympanic)   Resp 20   Ht 5\' 9"  (1.753 m)   Wt 70.3 kg   BMI 22.89 kg/m  General:   Alert,  pleasant and cooperative in NAD Head:  Normocephalic and atraumatic. Neck:  Supple; no masses or thyromegaly. Lungs:  Clear throughout to auscultation.    Heart:  Regular rate and rhythm. Abdomen:  Soft, nontender and nondistended. Normal bowel sounds, without guarding, and without rebound.   Neurologic:  Alert and  oriented x4;  grossly normal neurologically.  Impression/Plan: Thomas Huerta is here for an endoscopy to be performed for protein calorie malnutrition  Risks, benefits, limitations, and alternatives regarding  endoscopy have been reviewed with the patient.  Questions have been answered.  All parties agreeable.   Lannette Donathohini Renelle Stegenga, MD  12/14/2018, 2:19 PM

## 2018-12-14 NOTE — Anesthesia Preprocedure Evaluation (Addendum)
Anesthesia Evaluation  Patient identified by MRN, date of birth, ID band Patient awake    Reviewed: Allergy & Precautions, H&P , NPO status , reviewed documented beta blocker date and time   History of Anesthesia Complications (+) history of anesthetic complications  Airway Mallampati: II  TM Distance: >3 FB     Dental  (+) Chipped Wide gap upper incisors:   Pulmonary former smoker,    Pulmonary exam normal        Cardiovascular Normal cardiovascular exam     Neuro/Psych    GI/Hepatic   Endo/Other    Renal/GU      Musculoskeletal   Abdominal   Peds  Hematology   Anesthesia Other Findings Past Medical History: No date: Back pain     Comment:  chronic No date: Complication of anesthesia     Comment:  over medicated with Fentanyl No date: History of kidney stones     Comment:  5th time treating stones in 15 years No date: Obesity     Comment:  history of prior to gastric bypass  Past Surgical History: 09/11/2018: ANGIOPLASTY ILLIAC ARTERY; Left     Comment:  Procedure: BALLOON ANGIOPLASTY LEFT ILIAC VEIN;                Surgeon: Cephus Shelling, MD;  Location: [facility] OR;                Service: Vascular;  Laterality: Left; No date: CYST EXCISION     Comment:  left lower back beside the spine No date: CYSTOSCOPY 08/18/2017: CYSTOSCOPY W/ URETERAL STENT REMOVAL; Left     Comment:  Procedure: CYSTOSCOPY WITH STENT REMOVAL;  Surgeon:               Orson Ape, MD;  Location: [facility];  Service:               Urology;  Laterality: Left; 06/16/2017: CYSTOSCOPY WITH STENT PLACEMENT; Left     Comment:  Procedure: CYSTOSCOPY WITH STENT PLACEMENT;  Surgeon:               Orson Ape, MD;  Location: [facility] OR[facility];  Service:               Urology;  Laterality: Left; 06/25/2017: EXTRACORPOREAL SHOCK WAVE LITHOTRIPSY; Left     Comment:  Procedure: EXTRACORPOREAL SHOCK WAVE LITHOTRIPSY (ESWL);              Surgeon:  Orson Ape, MD;  Location: [facility] OR[facility];                Service: Urology;  Laterality: Left; 08/06/2017: EXTRACORPOREAL SHOCK WAVE LITHOTRIPSY; Left     Comment:  Procedure: EXTRACORPOREAL SHOCK WAVE LITHOTRIPSY (ESWL);              Surgeon: Orson Ape, MD;  Location: [facility];                Service: Urology;  Laterality: Left; 11/05/2017: EXTRACORPOREAL SHOCK WAVE LITHOTRIPSY; Right     Comment:  Procedure: EXTRACORPOREAL SHOCK WAVE LITHOTRIPSY (ESWL);              Surgeon: Orson Ape, MD;  Location: [facility];                Service: Urology;  Laterality: Right; 2004: GASTRIC BYPASS No date: HERNIA REPAIR; Bilateral     Comment:  inguinal hernias 09/11/2018: INSERTION OF ILIAC STENT; Left     Comment:  Procedure: INSERTION OF LEFT ILIAC STENT;  Surgeon:               Cephus Shelling, MD;  Location: [facility] OR;  Service:               Vascular;  Laterality: Left; 09/11/2018: INTRAVASCULAR ULTRASOUND/IVUS; N/A     Comment:  Procedure: INTRAVASCULAR ULTRASOUND/IVUS;  Surgeon:               Cephus Shelling, MD;  Location: [facility] OR;  Service:               Vascular;  Laterality: N/A; 09/10/2018: LOWER EXTREMITY VENOGRAPHY; Left     Comment:  Procedure: LOWER EXTREMITY VENOGRAPHY;  Surgeon: Sherren Kerns, MD;  Location: [facility];  Service:               Cardiovascular;  Laterality: Left; 09/10/2018: PERIPHERAL VASCULAR THROMBECTOMY; Left     Comment:  Procedure: PERIPHERAL VASCULAR THROMBECTOMY;  Surgeon:               Sherren Kerns, MD;  Location: [facility];                Service: Cardiovascular;  Laterality: Left;  lower               extremity venous system. No date: TONSILLECTOMY     Comment:  and addenoids No date: TONSILLECTOMY AND ADENOIDECTOMY 06/16/2017: URETEROSCOPY WITH HOLMIUM LASER LITHOTRIPSY; Left     Comment:  Procedure: URETEROSCOPY WITH HOLMIUM LASER LITHOTRIPSY;               Surgeon: Orson Ape, MD;   Location: [facility];                Service: Urology;  Laterality: Left; 09/11/2018: VENOGRAM; Left     Comment:  Procedure: VENOGRAM LEFT LEG;  Surgeon: Cephus Shelling, MD;  Location: [facility] OR;  Service: Vascular;               Laterality: Left;  BMI    Body Mass Index:  22.89 kg/m      Reproductive/Obstetrics                             Anesthesia Physical Anesthesia Plan  ASA: III  Anesthesia Plan: General   Post-op Pain Management:    Induction: Intravenous  PONV Risk Score and Plan: 1 and Treatment may vary due to age or medical condition and TIVA  Airway Management Planned: Nasal Cannula and Natural Airway  Additional Equipment:   Intra-op Plan:   Post-operative Plan:   Informed Consent: I have reviewed the patients History and Physical, chart, labs and discussed the procedure including the risks, benefits and alternatives for the proposed anesthesia with the patient or authorized representative who has indicated his/her understanding and acceptance.     Dental Advisory Given  Plan Discussed with: CRNA  Anesthesia Plan Comments:        Anesthesia Quick Evaluation

## 2018-12-15 ENCOUNTER — Encounter: Payer: Self-pay | Admitting: Gastroenterology

## 2018-12-15 LAB — ZINC: Zinc: 42 ug/dL — ABNORMAL LOW (ref 56–134)

## 2018-12-16 LAB — VITAMIN C: Vitamin C: 2 mg/dL (ref 0.2–2.0)

## 2018-12-16 LAB — VITAMIN B1: Thiamine: 206.9 nmol/L — ABNORMAL HIGH (ref 66.5–200.0)

## 2018-12-17 LAB — VITAMIN A: Vitamin A: 12.7 ug/dL — ABNORMAL LOW (ref 22.0–69.5)

## 2018-12-17 LAB — VITAMIN E
Vitamin E (Alpha Tocopherol): 9.8 mg/L (ref 9.0–29.0)
Vitamin E(Gamma Tocopherol): 1 mg/L (ref 0.5–4.9)

## 2018-12-17 LAB — SURGICAL PATHOLOGY

## 2018-12-20 ENCOUNTER — Inpatient Hospital Stay: Payer: BLUE CROSS/BLUE SHIELD | Attending: Oncology

## 2018-12-20 NOTE — Progress Notes (Signed)
Nutrition  Patient was a no show for nutrition appointment today.  Sent message to scheduling to offer another appointment and sent message to provider.  Anes Rigel B. Freida Busman, RD, LDN Registered Dietitian [phone] (pager)

## 2018-12-20 NOTE — Anesthesia Postprocedure Evaluation (Signed)
Anesthesia Post Note  Patient: [name]  Procedure(s) Performed: ESOPHAGOGASTRODUODENOSCOPY (EGD) WITH PROPOFOL (N/A )  Patient location during evaluation: Endoscopy Anesthesia Type: General Level of consciousness: awake and alert Pain management: pain level controlled Vital Signs Assessment: post-procedure vital signs reviewed and stable Respiratory status: spontaneous breathing, nonlabored ventilation and respiratory function stable Cardiovascular status: blood pressure returned to baseline and stable Postop Assessment: no apparent nausea or vomiting Anesthetic complications: no     Last Vitals:  Vitals:   12/14/18 1509 12/14/18 1511  BP: (!) 134/92 (!) 134/92  Pulse: (!) 52 (!) 52  Resp: 11 (!) 21  Temp:    SpO2: 100% 100%    Last Pain:  Vitals:   12/14/18 1511  TempSrc:   PainSc: 0-No pain                 Christia Reading

## 2018-12-21 ENCOUNTER — Other Ambulatory Visit: Payer: BLUE CROSS/BLUE SHIELD

## 2018-12-23 ENCOUNTER — Other Ambulatory Visit: Payer: Self-pay

## 2018-12-23 ENCOUNTER — Encounter (INDEPENDENT_AMBULATORY_CARE_PROVIDER_SITE_OTHER): Payer: Self-pay

## 2018-12-23 ENCOUNTER — Ambulatory Visit (INDEPENDENT_AMBULATORY_CARE_PROVIDER_SITE_OTHER): Payer: BLUE CROSS/BLUE SHIELD | Admitting: Gastroenterology

## 2018-12-23 ENCOUNTER — Encounter: Payer: Self-pay | Admitting: Gastroenterology

## 2018-12-23 VITALS — BP 120/84 | HR 73 | Resp 17 | Ht 69.0 in | Wt 146.9 lb

## 2018-12-23 DIAGNOSIS — E8809 Other disorders of plasma-protein metabolism, not elsewhere classified: Secondary | ICD-10-CM | POA: Diagnosis not present

## 2018-12-23 DIAGNOSIS — R609 Edema, unspecified: Secondary | ICD-10-CM

## 2018-12-23 DIAGNOSIS — E46 Unspecified protein-calorie malnutrition: Secondary | ICD-10-CM | POA: Diagnosis not present

## 2018-12-23 DIAGNOSIS — A048 Other specified bacterial intestinal infections: Secondary | ICD-10-CM

## 2018-12-23 MED ORDER — CLARITHROMYCIN 500 MG PO TABS: 500.0000 mg | ORAL_TABLET | Freq: Two times a day (BID) | ORAL | 0 refills | Status: AC

## 2018-12-23 MED ORDER — AMOXICILLIN 500 MG PO TABS: 1000.0000 mg | ORAL_TABLET | Freq: Two times a day (BID) | ORAL | 0 refills | Status: AC

## 2018-12-23 MED ORDER — AMOXICILLIN 500 MG PO TABS: 500.0000 mg | ORAL_TABLET | Freq: Two times a day (BID) | ORAL | 0 refills | Status: DC

## 2018-12-23 MED ORDER — OMEPRAZOLE 40 MG PO CPDR: 40.0000 mg | DELAYED_RELEASE_CAPSULE | Freq: Two times a day (BID) | ORAL | 0 refills | Status: DC

## 2018-12-23 MED ORDER — CLARITHROMYCIN 250 MG PO TABS: 250.0000 mg | ORAL_TABLET | Freq: Two times a day (BID) | ORAL | 0 refills | Status: DC

## 2018-12-23 NOTE — Progress Notes (Signed)
Thomas Repressohini R Devota Viruet, MD [address]  [address]  [place], [place] [address]  Main: [phone]  Fax: [phone]    Gastroenterology Consultation  Referring Provider:     Retia Passeose, Melissa S, NP Primary Care Physician:  Retia Passeose, Melissa S, NP Primary Gastroenterologist:  Dr. Arlyss Repressohini R Kieran Arreguin Reason for Consultation:     Protein calorie malnutrition, severe hypoalbuminemia        HPI:   Thomas Huerta is a 63 y.o. Caucasian male referred by Dr. Retia Passeose, Melissa S, NP  for consultation & management of severe hypoalbuminemia.  Patient underwent mini gastric bypass by Dr. Antony Madurautledge in 2004 at [facility]. He did not have follow-up since his bypass last 15 years as Dr. Antony Madurautledge moved to [place]. Patient's pre-bypass weight was 325lbs on 02/27/2003, post bypass, his weight was 176 pounds as of 11/22/2003.  For the last 2 years he has been maintaining weight between 150 and 156 pounds.    In spring last year, patient noticed generalized weakness, swelling of legs, progressively worse and was found to have deep vein thrombosis throughout the left leg extending up to the common femoral vein.  He was started on Eliquis.  He has significant weakness in his lower extremities, feels tired after walking for 30 minutes.  He underwent thrombectomy of the left popliteal, femoral vein, thrombolysis.  He has been progressively weak, swelling in his hands as well as scrotum.  He was recently evaluated by hematologist Dr. Cathie HoopsYu for new onset of DVT.  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  He is found to have severe hypoalbuminemia, 2.1, total protein 4.3.  No evidence of proteinuria, no evidence of cirrhosis or heterogeneity of the liver.  Mild ascites, no thrombocytopenia.  He has macrocytic anemia, on daily multivitamin, found to have normal B12 and folate levels.  Unknown ferritin and other micronutrient levels.  TSH normal.  He is  taking daily multivitamin with minerals, B12.  Patient is referred to GI for further evaluation of protein calorie malnutrition in setting of gastric bypass He feels fatigued all day.  He reports that his appetite is good and eating 3 times a day along with protein shakes.  He reports having regular bowel movements, denies steatorrhea or diarrhea. He denies abdominal pain.  Reports tenderness in his right anterior lower rib cage from bruise few days ago.  Reports pain in this area during inspiration  Follow-up visit 12/23/2018 Patient underwent upper endoscopy.  Stomach biopsies revealed H. pylori.  Small bowel biopsies were unremarkable.  He has zinc, copper, vitamin A, vitamin D deficiency.  He reports that the swelling of legs is improving but continues to feel fatigued.  He started taking zinc supplements.  He lost almost 10 pounds since last visit  He does smoke cigarettes, 1 pack lasts for 2 to 3 days Denies drinking alcohol  NSAIDs: None  Antiplts/Anticoagulants/Anti thrombotics: Eliquis for left lower extremity DVT  GI Procedures: Reports having colonoscopy every 5 years in [place] and reportedly normal Denies having an EGD in the past  Past Medical History:  Diagnosis Date  . Back pain    chronic  . Complication of anesthesia    over medicated with Fentanyl  . History of kidney stones    5th time treating stones in 15 years  . Obesity    history of prior to gastric bypass    Past Surgical History:  Procedure Laterality Date  .  ANGIOPLASTY ILLIAC ARTERY Left 09/11/2018   Procedure: BALLOON ANGIOPLASTY LEFT ILIAC VEIN;  Surgeon: Cephus Shelling, MD;  Location: [facility] OR;  Service: Vascular;  Laterality: Left;  . CYST EXCISION     left lower back beside the spine  . CYSTOSCOPY    . CYSTOSCOPY W/ URETERAL STENT REMOVAL Left 08/18/2017   Procedure: CYSTOSCOPY WITH STENT REMOVAL;  Surgeon: Orson Ape, MD;  Location: [facility] ORS;  Service: Urology;  Laterality: Left;  .  CYSTOSCOPY WITH STENT PLACEMENT Left 06/16/2017   Procedure: CYSTOSCOPY WITH STENT PLACEMENT;  Surgeon: Orson Ape, MD;  Location: [facility] ORS;  Service: Urology;  Laterality: Left;  . ESOPHAGOGASTRODUODENOSCOPY (EGD) WITH PROPOFOL N/A 12/14/2018   Procedure: ESOPHAGOGASTRODUODENOSCOPY (EGD) WITH PROPOFOL;  Surgeon: Toney Reil, MD;  Location: [facility] ENDOSCOPY;  Service: Gastroenterology;  Laterality: N/A;  . EXTRACORPOREAL SHOCK WAVE LITHOTRIPSY Left 06/25/2017   Procedure: EXTRACORPOREAL SHOCK WAVE LITHOTRIPSY (ESWL);  Surgeon: Orson Ape, MD;  Location: [facility] ORS;  Service: Urology;  Laterality: Left;  . EXTRACORPOREAL SHOCK WAVE LITHOTRIPSY Left 08/06/2017   Procedure: EXTRACORPOREAL SHOCK WAVE LITHOTRIPSY (ESWL);  Surgeon: Orson Ape, MD;  Location: [facility] ORS;  Service: Urology;  Laterality: Left;  . EXTRACORPOREAL SHOCK WAVE LITHOTRIPSY Right 11/05/2017   Procedure: EXTRACORPOREAL SHOCK WAVE LITHOTRIPSY (ESWL);  Surgeon: Orson Ape, MD;  Location: [facility] ORS;  Service: Urology;  Laterality: Right;  . GASTRIC BYPASS  2004  . HERNIA REPAIR Bilateral    inguinal hernias  . INSERTION OF ILIAC STENT Left 09/11/2018   Procedure: INSERTION OF LEFT ILIAC STENT;  Surgeon: Cephus Shelling, MD;  Location: [facility] OR;  Service: Vascular;  Laterality: Left;  . INTRAVASCULAR ULTRASOUND/IVUS N/A 09/11/2018   Procedure: INTRAVASCULAR ULTRASOUND/IVUS;  Surgeon: Cephus Shelling, MD;  Location: [facility] OR;  Service: Vascular;  Laterality: N/A;  . LOWER EXTREMITY VENOGRAPHY Left 09/10/2018   Procedure: LOWER EXTREMITY VENOGRAPHY;  Surgeon: Sherren Kerns, MD;  Location: [facility] INVASIVE CV LAB;  Service: Cardiovascular;  Laterality: Left;  . PERIPHERAL VASCULAR THROMBECTOMY Left 09/10/2018   Procedure: PERIPHERAL VASCULAR THROMBECTOMY;  Surgeon: Sherren Kerns, MD;  Location: [facility] INVASIVE CV LAB;  Service: Cardiovascular;  Laterality: Left;  lower extremity venous system.  . TONSILLECTOMY       and addenoids  . TONSILLECTOMY AND ADENOIDECTOMY    . URETEROSCOPY WITH HOLMIUM LASER LITHOTRIPSY Left 06/16/2017   Procedure: URETEROSCOPY WITH HOLMIUM LASER LITHOTRIPSY;  Surgeon: Orson Ape, MD;  Location: [facility] ORS;  Service: Urology;  Laterality: Left;  Marland Kitchen VENOGRAM Left 09/11/2018   Procedure: VENOGRAM LEFT LEG;  Surgeon: Cephus Shelling, MD;  Location: [facility] OR;  Service: Vascular;  Laterality: Left;    Current Outpatient Medications:  .  BELSOMRA 5 MG TABS, Take 5 mg by mouth at bedtime as needed for sleep., Disp: , Rfl: 0 .  Buprenorphine HCl-Naloxone HCl 4-1 MG FILM, PLACE 1 TABLET UNDER THE TONGUE AND LET DISSOVLE 2 TIMES DAILY., Disp: , Rfl:  .  Multiple Vitamins-Minerals (MULTIVITAMIN WITH MINERALS) tablet, Take 1 tablet by mouth daily., Disp: , Rfl:  .  PROAIR HFA 108 (90 Base) MCG/ACT inhaler, Inhale 2 puffs into the lungs every 6 (six) hours as needed for wheezing or shortness of breath., Disp: , Rfl: 0 .  spironolactone (ALDACTONE) 50 MG tablet, Take 50 mg by mouth daily., Disp: , Rfl:  .  vitamin B-12 (CYANOCOBALAMIN) 500 MCG tablet, Take 1 tablet (500 mcg total) by mouth daily., Disp: 90 tablet, Rfl: 3 .  XARELTO 20 MG TABS tablet, Take 20 mg by mouth daily., Disp: , Rfl:  .  acetaminophen (TYLENOL) 500 MG tablet, Take 1,000 mg by mouth every 6 (six) hours as needed (for pain.)., Disp: , Rfl:  .  amoxicillin (AMOXIL) 500 MG tablet, Take 1 tablet (500 mg total) by mouth 2 (two) times daily for 14 days., Disp: 28 tablet, Rfl: 0 .  clarithromycin (BIAXIN) 250 MG tablet, Take 1 tablet (250 mg total) by mouth 2 (two) times daily for 14 days., Disp: 28 tablet, Rfl: 0 .  omeprazole (PRILOSEC) 40 MG capsule, Take 1 capsule (40 mg total) by mouth 2 (two) times daily for 14 days., Disp: 28 capsule, Rfl: 0 .  Rivaroxaban 15 & 20 MG TBPK, Take as directed on package: Start with one 15mg  tablet by mouth twice a day with food. On Day 22, switch to one 20mg  tablet once a day with food.  (Patient not taking: Reported on 12/23/2018), Disp: 51 each, Rfl: 0   Family History  Problem Relation Age of Onset  . Lymphoma Mother   . Colon cancer Father      Social History   Tobacco Use  . Smoking status: Former Smoker    Packs/day: 1.00    Types: Cigarettes    Last attempt to quit: 11/01/2018    Years since quitting: 0.1  . Smokeless tobacco: Never Used  Substance Use Topics  . Alcohol use: No  . Drug use: Yes    Types: Oxycodone    Comment: past history age 57 with perscription Percocet    Allergies as of 12/23/2018 - Review Complete 12/23/2018  Allergen Reaction Noted  . Fentanyl Other (See Comments) 11/05/2017    Review of Systems:    All systems reviewed and negative except where noted in HPI.   Physical Exam:  BP 120/84 (BP Location: Left Arm, Patient Position: Sitting, Cuff Size: Normal)   Pulse 73   Resp 17   Ht 5\' 9"  (1.753 m)   Wt 146 lb 13.9 oz (66.6 kg)   BMI 21.69 kg/m  No LMP for male patient.  General:   Alert, pale appearing, poorly nourished Head:  Normocephalic and atraumatic. Eyes:  Sclera clear, no icterus.   Conjunctiva pink. Ears:  Normal auditory acuity. Nose:  No deformity, discharge, or lesions. Mouth:  No deformity or lesions,oropharynx pink & moist. Neck:  Supple; no masses or thyromegaly. Lungs:  Respirations even and unlabored.  Clear throughout to auscultation.   No wheezes, crackles, or rhonchi. No acute distress. Heart:  Regular rate and rhythm; no murmurs, clicks, rubs, or gallops. Abdomen:  Normal bowel sounds. Soft, non-tender and non-distended without masses, hepatosplenomegaly or hernias noted.  No guarding or rebound tenderness.   Rectal: Not performed Msk:  Symmetrical without gross deformities. Good, equal movement & strength bilaterally. Pulses:  Normal pulses noted. Extremities:  No clubbing, 3+ edema.  No cyanosis. Neurologic:  Alert and oriented x3;  grossly normal neurologically. Skin:  Intact without  significant lesions or rashes. No jaundice. Psych:  Alert and cooperative. Normal mood and affect.  Imaging Studies: Reviewed  Assessment and Plan:   AMIRR LIKES is a 63 y.o. Caucasian male with tobacco use, mini gastric bypass in 2004, extensive left lower extremity DVT in 08/2018 status post angioplasty, thrombectomy, thrombolysis, on Eliquis with moderate to severe protein calorie malnutrition in the setting of gastric bypass.  Mini gastric bypass is a combination of sleeve gastrectomy and gastrojejunal anastomosis which leads to both restrictive  and malabsorptive process.  Patient did not have regular post bypass follow-up.    Severe hypoalbuminemia, protein calorie malnutrition  I suspect that his hypoalbuminemia is probably secondary to malabsorption from mini gastric bypass.   Recommend seeking consultation with bariatric surgeon as patient is keen on reversal of his gastric bypass He does have micronutrient deficiencies including copper, zinc, vitamin A Recommended him to start taking them over-the-counter  Imaging studies revealed mildly atrophic pancreas, normal liver. He does not have diarrhea, will defer using pancreatic enzymes at this time 2D echo in 07/2018 was normal.  EF greater than 55%, normal LV function, no valvular abnormalities  H. pylori infection Triple therapy to treat H Pylori for 14days  Omeprazole 40mg  BID Clarithromycin 500mg  BID Amoxicillin 1gm BID Perform H. pylori breath test after 4 weeks of treatment to confirm eradication  Elevated alkaline phosphatase Ultrasound and CT revealed normal intrahepatic and extrahepatic biliary ducts Normal liver serum GGT normal, isoenzymes with normal liver fraction   Follow up as needed   Thomas Repress, MD

## 2018-12-23 NOTE — Patient Instructions (Addendum)
1. Start taking Vitamin A, Zinc, copper, Vitamin D 2. Recommend seeing bariatric surgeon to discuss about reversal of gastric bypass at [facility] or [facility] or [facility] [place] [facility] 3. You have Helicobacter Pylori infection, recommend 2 weeks course of antibiotics   Please call our office to speak with my nurse Ulyses Jarred [phone] during business hours from 8am to 4pm if you have any questions/concerns. During after hours, you will be redirected to on call GI physician. For any emergency please call 911 or go the nearest emergency room.    Arlyss Repress, MD [address]  [address]  [place], [place] [address]  Main: [phone]  Fax: [phone]

## 2018-12-24 LAB — VITAMIN K1, SERUM

## 2019-01-03 ENCOUNTER — Telehealth: Payer: Self-pay | Admitting: Gastroenterology

## 2019-01-03 NOTE — Telephone Encounter (Signed)
Pt left vm he states Dr. Allegra Lai is referring him to a bariatric Doctor and the day he received the information he was driving he would like a call please

## 2019-01-05 ENCOUNTER — Other Ambulatory Visit: Payer: Self-pay

## 2019-01-06 ENCOUNTER — Other Ambulatory Visit: Payer: Self-pay

## 2019-01-06 DIAGNOSIS — R634 Abnormal weight loss: Secondary | ICD-10-CM

## 2019-01-06 DIAGNOSIS — Z9884 Bariatric surgery status: Secondary | ICD-10-CM

## 2019-01-06 DIAGNOSIS — E46 Unspecified protein-calorie malnutrition: Secondary | ICD-10-CM

## 2019-01-06 NOTE — Progress Notes (Signed)
Referral to [facility] bariatric Dr. Leona Carry has been placed, pt has been notified and verbalized understanding

## 2019-01-06 NOTE — Telephone Encounter (Signed)
Urgent referral to [facility] has been ordered, pt has been notified and verbalized understanding

## 2019-03-03 ENCOUNTER — Encounter (HOSPITAL_COMMUNITY): Payer: BLUE CROSS/BLUE SHIELD

## 2019-03-03 ENCOUNTER — Ambulatory Visit: Payer: BLUE CROSS/BLUE SHIELD | Admitting: Vascular Surgery

## 2019-03-07 ENCOUNTER — Telehealth: Payer: Self-pay | Admitting: *Deleted

## 2019-03-07 NOTE — Telephone Encounter (Signed)
Called patient x3 in reference to his 03/08/19 appts. Per MD to have patient labs drawn 1 day prior and offer a WEBEX/DOX Visit on day 2,  I was unable to reach him. A detailed message was left on pt vmail. No appts were changed.

## 2019-03-08 ENCOUNTER — Inpatient Hospital Stay: Payer: BLUE CROSS/BLUE SHIELD

## 2019-03-08 ENCOUNTER — Ambulatory Visit: Payer: BLUE CROSS/BLUE SHIELD | Admitting: Oncology

## 2019-03-08 ENCOUNTER — Other Ambulatory Visit: Payer: BLUE CROSS/BLUE SHIELD

## 2019-03-08 ENCOUNTER — Inpatient Hospital Stay: Payer: BLUE CROSS/BLUE SHIELD | Admitting: Oncology

## 2019-03-23 ENCOUNTER — Encounter: Payer: Self-pay | Admitting: *Deleted

## 2019-04-15 ENCOUNTER — Inpatient Hospital Stay: Payer: BLUE CROSS/BLUE SHIELD | Admitting: Oncology

## 2019-04-15 ENCOUNTER — Inpatient Hospital Stay: Payer: BLUE CROSS/BLUE SHIELD

## 2019-04-22 ENCOUNTER — Encounter: Payer: Self-pay | Admitting: Vascular Surgery

## 2019-05-03 ENCOUNTER — Telehealth: Payer: Self-pay

## 2019-05-03 NOTE — Telephone Encounter (Signed)
Pt called and said that he would like to get an appt. He said that he has a history of DVT and he is having bilateral leg swelling and they are very heavy and they have gotten much worse over the past 48 hours.    Discussed with Jacqlyn Larsen just to verify and called pt and left message advising that he first start with his PCP to rule out any CHF issues. Given his problem is bilateral and getting worse I thought it best if he rule that out before seeing [place]. Told him that if they thought it was a vascular issue we would be happy to get him in for an appt.   Advised in message if his symptoms get worse before seeing the PCP - to go to the ER for evaluation.   York Cerise, CMA

## 2019-05-04 ENCOUNTER — Ambulatory Visit (HOSPITAL_COMMUNITY)
Admission: RE | Admit: 2019-05-04 | Discharge: 2019-05-04 | Disposition: A | Discharge status: Home / Self Care | Payer: BC Managed Care – PPO | Source: Ambulatory Visit | Attending: Family | Admitting: Family

## 2019-05-04 ENCOUNTER — Other Ambulatory Visit: Payer: Self-pay

## 2019-05-04 ENCOUNTER — Other Ambulatory Visit (HOSPITAL_COMMUNITY): Payer: Self-pay | Admitting: Family

## 2019-05-04 DIAGNOSIS — M7989 Other specified soft tissue disorders: Secondary | ICD-10-CM | POA: Diagnosis present

## 2019-05-04 DIAGNOSIS — M79606 Pain in leg, unspecified: Secondary | ICD-10-CM | POA: Diagnosis not present

## 2019-05-04 NOTE — Progress Notes (Signed)
BLE venous duplex       has been completed. Preliminary results can be found under CV proc through chart review in EPIC.  Negative for DVT bilaterally.  June Leap, BS, RDMS, RVT

## 2019-05-09 ENCOUNTER — Other Ambulatory Visit
Admission: RE | Admit: 2019-05-09 | Discharge: 2019-05-09 | Disposition: A | Discharge status: Home / Self Care | Payer: BC Managed Care – PPO | Source: Ambulatory Visit | Attending: Internal Medicine | Admitting: Internal Medicine

## 2019-05-09 ENCOUNTER — Other Ambulatory Visit: Payer: Self-pay

## 2019-05-09 DIAGNOSIS — M79606 Pain in leg, unspecified: Secondary | ICD-10-CM

## 2019-05-09 DIAGNOSIS — E46 Unspecified protein-calorie malnutrition: Secondary | ICD-10-CM | POA: Insufficient documentation

## 2019-05-09 LAB — CBC WITH DIFFERENTIAL/PLATELET
Abs Immature Granulocytes: 0.02 10*3/uL (ref 0.00–0.07)
Basophils Absolute: 0.1 10*3/uL (ref 0.0–0.1)
Basophils Relative: 2 %
Eosinophils Absolute: 0.3 10*3/uL (ref 0.0–0.5)
Eosinophils Relative: 5 %
HCT: 27.4 % — ABNORMAL LOW (ref 39.0–52.0)
Hemoglobin: 8.7 g/dL — ABNORMAL LOW (ref 13.0–17.0)
Immature Granulocytes: 0 %
Lymphocytes Relative: 25 %
Lymphs Abs: 1.3 10*3/uL (ref 0.7–4.0)
MCH: 35.5 pg — ABNORMAL HIGH (ref 26.0–34.0)
MCHC: 31.8 g/dL (ref 30.0–36.0)
MCV: 111.8 fL — ABNORMAL HIGH (ref 80.0–100.0)
Monocytes Absolute: 0.5 10*3/uL (ref 0.1–1.0)
Monocytes Relative: 9 %
Neutro Abs: 3 10*3/uL (ref 1.7–7.7)
Neutrophils Relative %: 59 %
Platelets: 246 10*3/uL (ref 150–400)
RBC: 2.45 MIL/uL — ABNORMAL LOW (ref 4.22–5.81)
RDW: 15.2 % (ref 11.5–15.5)
WBC: 5.1 10*3/uL (ref 4.0–10.5)
nRBC: 0 % (ref 0.0–0.2)

## 2019-05-09 LAB — COMPREHENSIVE METABOLIC PANEL
ALT: 59 U/L — ABNORMAL HIGH (ref 0–44)
AST: 43 U/L — ABNORMAL HIGH (ref 15–41)
Albumin: 2 g/dL — ABNORMAL LOW (ref 3.5–5.0)
Alkaline Phosphatase: 118 U/L (ref 38–126)
Anion gap: 7 (ref 5–15)
BUN: 29 mg/dL — ABNORMAL HIGH (ref 6–20)
CO2: 24 mmol/L (ref 22–32)
Calcium: 7.3 mg/dL — ABNORMAL LOW (ref 8.9–10.3)
Chloride: 107 mmol/L (ref 98–111)
Creatinine, Ser: 0.98 mg/dL (ref 0.61–1.24)
GFR calc Af Amer: >60 mL/min (ref >60)
GFR calc non Af Amer: >60 mL/min (ref >60)
Glucose, Bld: 66 mg/dL — ABNORMAL LOW (ref 70–99)
Potassium: 4.2 mmol/L (ref 3.5–5.1)
Sodium: 138 mmol/L (ref 135–145)
Total Bilirubin: 0.3 mg/dL (ref 0.3–1.2)
Total Protein: 4.1 g/dL — ABNORMAL LOW (ref 6.5–8.1)

## 2019-05-09 LAB — PHOSPHORUS: Phosphorus: 3.4 mg/dL (ref 2.5–4.6)

## 2019-05-09 LAB — MAGNESIUM: Magnesium: 1.8 mg/dL (ref 1.7–2.4)

## 2019-05-12 ENCOUNTER — Other Ambulatory Visit: Payer: Self-pay

## 2019-05-12 ENCOUNTER — Ambulatory Visit: Payer: BLUE CROSS/BLUE SHIELD | Admitting: Vascular Surgery

## 2019-05-12 ENCOUNTER — Encounter: Payer: Self-pay | Admitting: Vascular Surgery

## 2019-05-12 ENCOUNTER — Ambulatory Visit (HOSPITAL_COMMUNITY)
Admission: RE | Admit: 2019-05-12 | Discharge: 2019-05-12 | Disposition: A | Discharge status: Home / Self Care | Payer: BC Managed Care – PPO | Source: Ambulatory Visit | Attending: Vascular Surgery | Admitting: Vascular Surgery

## 2019-05-12 VITALS — BP 118/80 | HR 66 | Temp 97.2°F | Resp 20 | Ht 69.0 in | Wt 146.3 lb

## 2019-05-12 DIAGNOSIS — I871 Compression of vein: Secondary | ICD-10-CM

## 2019-05-12 DIAGNOSIS — M7989 Other specified soft tissue disorders: Secondary | ICD-10-CM | POA: Diagnosis not present

## 2019-05-12 DIAGNOSIS — M79606 Pain in leg, unspecified: Secondary | ICD-10-CM | POA: Diagnosis not present

## 2019-05-12 NOTE — Progress Notes (Signed)
Patient is a 63 year old male who returns for follow-up today.  He previously underwent thrombolysis of his left iliofemoral system and stenting of his left common and external iliac vein 08/2018.  Patient is currently on Xarelto.  He would like to get off the Xarelto at some point.  Currently is undergoing an evaluation for weight loss malnutrition and anemia.  This is thought to be currently secondary to prior gastric bypass.  He has follow-up scheduled with a general surgeon at [facility] next week.  He is currently receiving TPN.  He complains today of bilateral lower extremity edema.  He states this is been present for months and not improving.  Most recent serum albumin was 2.0  Past Medical History:  Diagnosis Date  . Back pain    chronic  . Complication of anesthesia    over medicated with Fentanyl  . History of kidney stones    5th time treating stones in 15 years  . Obesity    history of prior to gastric bypass    Past Surgical History:  Procedure Laterality Date  . ANGIOPLASTY ILLIAC ARTERY Left 09/11/2018   Procedure: BALLOON ANGIOPLASTY LEFT ILIAC VEIN;  Surgeon: Marty Heck, MD;  Location: [place];  Service: Vascular;  Laterality: Left;  . CYST EXCISION     left lower back beside the spine  . CYSTOSCOPY    . CYSTOSCOPY W/ URETERAL STENT REMOVAL Left 08/18/2017   Procedure: CYSTOSCOPY WITH STENT REMOVAL;  Surgeon: Royston Cowper, MD;  Location: [facility] ORS;  Service: Urology;  Laterality: Left;  . CYSTOSCOPY WITH STENT PLACEMENT Left 06/16/2017   Procedure: CYSTOSCOPY WITH STENT PLACEMENT;  Surgeon: Royston Cowper, MD;  Location: [facility] ORS;  Service: Urology;  Laterality: Left;  . ESOPHAGOGASTRODUODENOSCOPY (EGD) WITH PROPOFOL N/A 12/14/2018   Procedure: ESOPHAGOGASTRODUODENOSCOPY (EGD) WITH PROPOFOL;  Surgeon: Lin Landsman, MD;  Location: [facility]COPY;  Service: Gastroenterology;  Laterality: N/A;  . EXTRACORPOREAL SHOCK WAVE LITHOTRIPSY Left 06/25/2017   Procedure: EXTRACORPOREAL SHOCK WAVE LITHOTRIPSY (ESWL);  Surgeon: Royston Cowper, MD;  Location: [facility] ORS;  Service: Urology;  Laterality: Left;  . EXTRACORPOREAL SHOCK WAVE LITHOTRIPSY Left 08/06/2017   Procedure: EXTRACORPOREAL SHOCK WAVE LITHOTRIPSY (ESWL);  Surgeon: Royston Cowper, MD;  Location: [facility] ORS;  Service: Urology;  Laterality: Left;  . EXTRACORPOREAL SHOCK WAVE LITHOTRIPSY Right 11/05/2017   Procedure: EXTRACORPOREAL SHOCK WAVE LITHOTRIPSY (ESWL);  Surgeon: Royston Cowper, MD;  Location: [facility] ORS;  Service: Urology;  Laterality: Right;  . GASTRIC BYPASS  2004  . HERNIA REPAIR Bilateral    inguinal hernias  . INSERTION OF ILIAC STENT Left 09/11/2018   Procedure: INSERTION OF LEFT ILIAC STENT;  Surgeon: Marty Heck, MD;  Location: [place];  Service: Vascular;  Laterality: Left;  . INTRAVASCULAR ULTRASOUND/IVUS N/A 09/11/2018   Procedure: INTRAVASCULAR ULTRASOUND/IVUS;  Surgeon: Marty Heck, MD;  Location: [place];  Service: Vascular;  Laterality: N/A;  . LOWER EXTREMITY VENOGRAPHY Left 09/10/2018   Procedure: LOWER EXTREMITY VENOGRAPHY;  Surgeon: Elam Dutch, MD;  Location: [place] CV LAB;  Service: Cardiovascular;  Laterality: Left;  . PERIPHERAL VASCULAR THROMBECTOMY Left 09/10/2018   Procedure: PERIPHERAL VASCULAR THROMBECTOMY;  Surgeon: Elam Dutch, MD;  Location: [place] CV LAB;  Service: Cardiovascular;  Laterality: Left;  lower extremity venous system.  . TONSILLECTOMY     and addenoids  . TONSILLECTOMY AND ADENOIDECTOMY    . URETEROSCOPY WITH HOLMIUM LASER LITHOTRIPSY Left 06/16/2017   Procedure: URETEROSCOPY WITH HOLMIUM  LASER LITHOTRIPSY;  Surgeon: Orson ApeWolff, Michael R, MD;  Location: [facility] ORS;  Service: Urology;  Laterality: Left;  Marland Kitchen. VENOGRAM Left 09/11/2018   Procedure: VENOGRAM LEFT LEG;  Surgeon: Cephus Shellinglark, Christopher J, MD;  [facility];  Service: Vascular;  Laterality: Left;     Current Outpatient Medications on File Prior to  Visit  Medication Sig Dispense Refill  . Buprenorphine HCl-Naloxone HCl 4-1 MG FILM PLACE 1 TABLET UNDER THE TONGUE AND LET DISSOVLE 2 TIMES DAILY.    . Multiple Vitamins-Minerals (MULTIVITAMIN WITH MINERALS) tablet Take 1 tablet by mouth daily.    Marland Kitchen. spironolactone (ALDACTONE) 50 MG tablet Take 50 mg by mouth daily.    . vitamin B-12 (CYANOCOBALAMIN) 500 MCG tablet Take 1 tablet (500 mcg total) by mouth daily. 90 tablet 3  . XARELTO 20 MG TABS tablet Take 20 mg by mouth daily.     No current facility-administered medications on file prior to visit.    Review of systems: Denies shortness of breath.  He denies chest pain.  Physical exam:  Vitals:   05/12/19 1106  BP: 118/80  Pulse: 66  Resp: 20  Temp: (!) 97.2 F (36.2 C)  SpO2: 98%  Weight: 146 lb 4.8 oz (66.4 kg)  Height: 5\' 9"  (1.753 m)    Extremities: Diffuse edema extending from the hip all the way down to the foot bilaterally.  This is minimally pitting at this point and seems very chronic  Skin: No open ulceration  Vascular: 2+ dorsalis pedis pulses bilaterally  Data: Patient had a duplex ultrasound today for reflux.  This showed diffuse reflux in the left and right greater saphenous vein.  He also had common femoral vein reflux bilaterally.  Waveforms were consistent that his iliac stent is most likely patent.  There was no discrepancy between swelling in the left leg and the right leg.  Assessment: Patent common external iliac artery venous stent.  Patient with chronic leg swelling both legs.  This is most likely secondary to malnutrition currently.  He also does have a component of venous disease with reflux in his superficial and deep system.  I do not believe he is a candidate for laser ablation however due to his prior deep vein problems that we could certainly exacerbate things.  Plan: I believe from a leg swelling standpoint the mainstay of therapy for him currently is going to be lower extremity compression  stockings.  He will follow-up with us with a duplex of his stent in 3 months time.  We will decide at the end of that 3 months whether or not to continue his Xarelto or switch him over to something just like aspirin  Fabienne Brunsharles , MD [facility] [place] Office: [phone] Pager: [phone]

## 2019-05-13 ENCOUNTER — Other Ambulatory Visit
Admission: RE | Admit: 2019-05-13 | Discharge: 2019-05-13 | Disposition: A | Discharge status: Home / Self Care | Payer: BC Managed Care – PPO | Source: Ambulatory Visit | Attending: Internal Medicine | Admitting: Internal Medicine

## 2019-05-13 ENCOUNTER — Inpatient Hospital Stay: Payer: BC Managed Care – PPO

## 2019-05-13 ENCOUNTER — Inpatient Hospital Stay: Payer: BC Managed Care – PPO | Admitting: Oncology

## 2019-05-13 DIAGNOSIS — E46 Unspecified protein-calorie malnutrition: Secondary | ICD-10-CM | POA: Insufficient documentation

## 2019-05-13 LAB — COMPREHENSIVE METABOLIC PANEL
ALT: 43 U/L (ref 0–44)
AST: 29 U/L (ref 15–41)
Albumin: 2.6 g/dL — ABNORMAL LOW (ref 3.5–5.0)
Alkaline Phosphatase: 126 U/L (ref 38–126)
Anion gap: 4 — ABNORMAL LOW (ref 5–15)
BUN: 24 mg/dL — ABNORMAL HIGH (ref 6–20)
CO2: 24 mmol/L (ref 22–32)
Calcium: 7.7 mg/dL — ABNORMAL LOW (ref 8.9–10.3)
Chloride: 113 mmol/L — ABNORMAL HIGH (ref 98–111)
Creatinine, Ser: 0.93 mg/dL (ref 0.61–1.24)
GFR calc Af Amer: >60 mL/min (ref >60)
GFR calc non Af Amer: >60 mL/min (ref >60)
Glucose, Bld: 49 mg/dL — ABNORMAL LOW (ref 70–99)
Potassium: 4.7 mmol/L (ref 3.5–5.1)
Sodium: 141 mmol/L (ref 135–145)
Total Bilirubin: 0.3 mg/dL (ref 0.3–1.2)
Total Protein: 4.8 g/dL — ABNORMAL LOW (ref 6.5–8.1)

## 2019-05-13 LAB — MAGNESIUM: Magnesium: 2.1 mg/dL (ref 1.7–2.4)

## 2019-05-13 LAB — PHOSPHORUS: Phosphorus: 3.9 mg/dL (ref 2.5–4.6)

## 2019-05-26 DIAGNOSIS — D6851 Activated protein C resistance: Secondary | ICD-10-CM | POA: Insufficient documentation

## 2019-06-07 ENCOUNTER — Other Ambulatory Visit
Admission: RE | Admit: 2019-06-07 | Discharge: 2019-06-07 | Disposition: A | Discharge status: Home / Self Care | Payer: BC Managed Care – PPO | Source: Ambulatory Visit | Attending: Internal Medicine | Admitting: Internal Medicine

## 2019-06-07 DIAGNOSIS — E46 Unspecified protein-calorie malnutrition: Secondary | ICD-10-CM | POA: Insufficient documentation

## 2019-06-07 LAB — COMPREHENSIVE METABOLIC PANEL
ALT: 23 U/L (ref 0–44)
AST: 28 U/L (ref 15–41)
Albumin: 3 g/dL — ABNORMAL LOW (ref 3.5–5.0)
Alkaline Phosphatase: 93 U/L (ref 38–126)
Anion gap: 8 (ref 5–15)
BUN: 36 mg/dL — ABNORMAL HIGH (ref 6–20)
CO2: 22 mmol/L (ref 22–32)
Calcium: 8.1 mg/dL — ABNORMAL LOW (ref 8.9–10.3)
Chloride: 109 mmol/L (ref 98–111)
Creatinine, Ser: 0.77 mg/dL (ref 0.61–1.24)
GFR calc Af Amer: >60 mL/min (ref >60)
GFR calc non Af Amer: >60 mL/min (ref >60)
Glucose, Bld: 68 mg/dL — ABNORMAL LOW (ref 70–99)
Potassium: 4.6 mmol/L (ref 3.5–5.1)
Sodium: 139 mmol/L (ref 135–145)
Total Bilirubin: 0.4 mg/dL (ref 0.3–1.2)
Total Protein: 4.9 g/dL — ABNORMAL LOW (ref 6.5–8.1)

## 2019-06-07 LAB — CBC WITH DIFFERENTIAL/PLATELET
Abs Immature Granulocytes: 0.02 10*3/uL (ref 0.00–0.07)
Basophils Absolute: 0.1 10*3/uL (ref 0.0–0.1)
Basophils Relative: 1 %
Eosinophils Absolute: 0.3 10*3/uL (ref 0.0–0.5)
Eosinophils Relative: 5 %
HCT: 28.5 % — ABNORMAL LOW (ref 39.0–52.0)
Hemoglobin: 8.9 g/dL — ABNORMAL LOW (ref 13.0–17.0)
Immature Granulocytes: 0 %
Lymphocytes Relative: 21 %
Lymphs Abs: 1.4 10*3/uL (ref 0.7–4.0)
MCH: 34.6 pg — ABNORMAL HIGH (ref 26.0–34.0)
MCHC: 31.2 g/dL (ref 30.0–36.0)
MCV: 110.9 fL — ABNORMAL HIGH (ref 80.0–100.0)
Monocytes Absolute: 0.5 10*3/uL (ref 0.1–1.0)
Monocytes Relative: 8 %
Neutro Abs: 4.2 10*3/uL (ref 1.7–7.7)
Neutrophils Relative %: 65 %
Platelets: 227 10*3/uL (ref 150–400)
RBC: 2.57 MIL/uL — ABNORMAL LOW (ref 4.22–5.81)
RDW: 12.8 % (ref 11.5–15.5)
WBC: 6.5 10*3/uL (ref 4.0–10.5)
nRBC: 0 % (ref 0.0–0.2)

## 2019-06-07 LAB — MAGNESIUM: Magnesium: 2.1 mg/dL (ref 1.7–2.4)

## 2019-06-07 LAB — PHOSPHORUS: Phosphorus: 4.5 mg/dL (ref 2.5–4.6)

## 2019-06-20 ENCOUNTER — Other Ambulatory Visit
Admission: RE | Admit: 2019-06-20 | Discharge: 2019-06-20 | Disposition: A | Discharge status: Home / Self Care | Payer: BC Managed Care – PPO | Source: Ambulatory Visit | Attending: Internal Medicine | Admitting: Internal Medicine

## 2019-06-20 DIAGNOSIS — E46 Unspecified protein-calorie malnutrition: Secondary | ICD-10-CM | POA: Insufficient documentation

## 2019-06-20 LAB — COMPREHENSIVE METABOLIC PANEL
ALT: 23 U/L (ref 0–44)
AST: 28 U/L (ref 15–41)
Albumin: 3.3 g/dL — ABNORMAL LOW (ref 3.5–5.0)
Alkaline Phosphatase: 99 U/L (ref 38–126)
Anion gap: 7 (ref 5–15)
BUN: 21 mg/dL — ABNORMAL HIGH (ref 6–20)
CO2: 22 mmol/L (ref 22–32)
Calcium: 8 mg/dL — ABNORMAL LOW (ref 8.9–10.3)
Chloride: 110 mmol/L (ref 98–111)
Creatinine, Ser: 0.96 mg/dL (ref 0.61–1.24)
GFR calc Af Amer: >60 mL/min (ref >60)
GFR calc non Af Amer: >60 mL/min (ref >60)
Glucose, Bld: 107 mg/dL — ABNORMAL HIGH (ref 70–99)
Potassium: 4.5 mmol/L (ref 3.5–5.1)
Sodium: 139 mmol/L (ref 135–145)
Total Bilirubin: 0.4 mg/dL (ref 0.3–1.2)
Total Protein: 5.2 g/dL — ABNORMAL LOW (ref 6.5–8.1)

## 2019-06-20 LAB — CBC WITH DIFFERENTIAL/PLATELET
Abs Immature Granulocytes: 0.01 10*3/uL (ref 0.00–0.07)
Basophils Absolute: 0.1 10*3/uL (ref 0.0–0.1)
Basophils Relative: 2 %
Eosinophils Absolute: 0.3 10*3/uL (ref 0.0–0.5)
Eosinophils Relative: 7 %
HCT: 28.5 % — ABNORMAL LOW (ref 39.0–52.0)
Hemoglobin: 9 g/dL — ABNORMAL LOW (ref 13.0–17.0)
Immature Granulocytes: 0 %
Lymphocytes Relative: 28 %
Lymphs Abs: 1.4 10*3/uL (ref 0.7–4.0)
MCH: 33.1 pg (ref 26.0–34.0)
MCHC: 31.6 g/dL (ref 30.0–36.0)
MCV: 104.8 fL — ABNORMAL HIGH (ref 80.0–100.0)
Monocytes Absolute: 0.4 10*3/uL (ref 0.1–1.0)
Monocytes Relative: 9 %
Neutro Abs: 2.7 10*3/uL (ref 1.7–7.7)
Neutrophils Relative %: 54 %
Platelets: 223 10*3/uL (ref 150–400)
RBC: 2.72 MIL/uL — ABNORMAL LOW (ref 4.22–5.81)
RDW: 12.6 % (ref 11.5–15.5)
WBC: 4.9 10*3/uL (ref 4.0–10.5)
nRBC: 0 % (ref 0.0–0.2)

## 2019-06-20 LAB — MAGNESIUM: Magnesium: 1.8 mg/dL (ref 1.7–2.4)

## 2019-06-21 LAB — PHOSPHORUS: Phosphorus: 4.6 mg/dL (ref 2.5–4.6)

## 2019-08-04 ENCOUNTER — Other Ambulatory Visit
Admission: RE | Admit: 2019-08-04 | Discharge: 2019-08-04 | Disposition: A | Discharge status: Home / Self Care | Payer: BC Managed Care – PPO | Source: Other Acute Inpatient Hospital | Attending: Internal Medicine | Admitting: Internal Medicine

## 2019-08-04 DIAGNOSIS — Z9884 Bariatric surgery status: Secondary | ICD-10-CM | POA: Insufficient documentation

## 2019-08-04 DIAGNOSIS — E46 Unspecified protein-calorie malnutrition: Secondary | ICD-10-CM | POA: Diagnosis present

## 2019-08-04 DIAGNOSIS — E43 Unspecified severe protein-calorie malnutrition: Secondary | ICD-10-CM | POA: Insufficient documentation

## 2019-08-04 LAB — COMPREHENSIVE METABOLIC PANEL
ALT: 15 U/L (ref 0–44)
AST: 22 U/L (ref 15–41)
Albumin: 3.4 g/dL — ABNORMAL LOW (ref 3.5–5.0)
Alkaline Phosphatase: 118 U/L (ref 38–126)
Anion gap: 9 (ref 5–15)
BUN: 17 mg/dL (ref 8–23)
CO2: 22 mmol/L (ref 22–32)
Calcium: 8.3 mg/dL — ABNORMAL LOW (ref 8.9–10.3)
Chloride: 113 mmol/L — ABNORMAL HIGH (ref 98–111)
Creatinine, Ser: 0.96 mg/dL (ref 0.61–1.24)
GFR calc Af Amer: >60 mL/min (ref >60)
GFR calc non Af Amer: >60 mL/min (ref >60)
Glucose, Bld: 69 mg/dL — ABNORMAL LOW (ref 70–99)
Potassium: 4.3 mmol/L (ref 3.5–5.1)
Sodium: 144 mmol/L (ref 135–145)
Total Bilirubin: 0.4 mg/dL (ref 0.3–1.2)
Total Protein: 5.6 g/dL — ABNORMAL LOW (ref 6.5–8.1)

## 2019-08-09 ENCOUNTER — Other Ambulatory Visit: Payer: Self-pay

## 2019-08-09 DIAGNOSIS — I871 Compression of vein: Secondary | ICD-10-CM

## 2019-08-09 DIAGNOSIS — M79606 Pain in leg, unspecified: Secondary | ICD-10-CM

## 2019-08-10 ENCOUNTER — Telehealth: Payer: Self-pay | Admitting: *Deleted

## 2019-08-10 NOTE — Telephone Encounter (Signed)
Virtual Visit Pre-Appointment Phone Call  Today, I spoke with Thomas Huerta and performed the following actions:  1. I explained that we are currently trying to limit exposure to the COVID-19 virus by seeing patients at home rather than in the office.  I explained that the visits are best done by video, but can be done by telephone.  I asked the patient if a virtual visit that the patient would like to try instead of coming into the office. Thomas Huerta agreed to proceed with the virtual visit scheduled with Fabienne Bruns on 08/11/19.       2. I confirmed the BEST phone number to call the day of the visit and- I included this in appointment notes.  3. I asked if the patient had access to (through a family member/friend) a smartphone with video capability to be used for his visit?"  The patient said yes -   4. I confirmed consent by  a. sending through MyChart or by email the FULL LENGTH CONSENT FOR TELE-HEALTH VISIT as written at the end of this message or  b. verbally as listed below. i. This visit is being performed in the setting of COVID-19. ii. All virtual visits are billed to your insurance company just like a normal visit would be.   iii. We'd like you to understand that the technology does not allow for your provider to perform an examination, and thus may limit your provider's ability to fully assess your condition.  iv. If your provider identifies any concerns that need to be evaluated in person, we will make arrangements to do so.   v. Finally, though the technology is pretty good, we cannot assure that it will always work on either your or our end, and in the setting of a video visit, we may have to convert it to a phone-only visit.  In either situation, we cannot ensure that we have a secure connection.   vi. Are you willing to proceed?"  STAFF: Did the patient verbally acknowledge consent to telehealth visit? Document YES/NO here: YES  2. I advised the patient  to be prepared - I asked that the patient, on the day of his visit, record any information possible with the equipment at his home, such as blood pressure, pulse, oxygen saturation, and your weight and write them all down. I asked the patient to have a pen and paper handy nearby the day of the visit as well.  3. If the patient was scheduled for a video visit, I informed the patient that the visit with the doctor would start with a text to the smartphone # given to [place] by the patient.         If the patient was scheduled for a telephone call, I informed the patient that the visit with the doctor would start with a call to the telephone # given to [place] by the patient.  4. I Informed patient they will receive a phone call 15 minutes prior to their appointment time from a CMA or nurse to review medications, allergies, etc. to prepare for the visit.    TELEPHONE CALL NOTE  Thomas Huerta has been deemed a candidate for a follow-up tele-health visit to limit community exposure during the Covid-19 pandemic. I spoke with the patient via phone to ensure availability of phone/video source, confirm preferred email & phone number, and discuss instructions and expectations.  I reminded Thomas Huerta to be prepared with any vital sign  and/or heart rhythm information that could potentially be obtained via home monitoring, at the time of his visit. I reminded Thomas Huerta to expect a phone call prior to his visit.  Cleaster Corin, NT 08/10/2019 3:04 PM     FULL LENGTH CONSENT FOR TELE-HEALTH VISIT   I hereby voluntarily request, consent and authorize [facility] and its employed or contracted physicians, physician assistants, nurse practitioners or other licensed health care professionals (the Practitioner), to provide me with telemedicine health care services (the "Services") as deemed necessary by the treating Practitioner. I acknowledge and consent to receive the Services by the  Practitioner via telemedicine. I understand that the telemedicine visit will involve communicating with the Practitioner through live audiovisual communication technology and the disclosure of certain medical information by electronic transmission. I acknowledge that I have been given the opportunity to request an in-person assessment or other available alternative prior to the telemedicine visit and am voluntarily participating in the telemedicine visit.  I understand that I have the right to withhold or withdraw my consent to the use of telemedicine in the course of my care at any time, without affecting my right to future care or treatment, and that the Practitioner or I may terminate the telemedicine visit at any time. I understand that I have the right to inspect all information obtained and/or recorded in the course of the telemedicine visit and may receive copies of available information for a reasonable fee.  I understand that some of the potential risks of receiving the Services via telemedicine include:  Marland Kitchen Delay or interruption in medical evaluation due to technological equipment failure or disruption; . Information transmitted may not be sufficient (e.g. poor resolution of images) to allow for appropriate medical decision making by the Practitioner; and/or  . In rare instances, security protocols could fail, causing a breach of personal health information.  Furthermore, I acknowledge that it is my responsibility to provide information about my medical history, conditions and care that is complete and accurate to the best of my ability. I acknowledge that Practitioner's advice, recommendations, and/or decision may be based on factors not within their control, such as incomplete or inaccurate data provided by me or distortions of diagnostic images or specimens that may result from electronic transmissions. I understand that the practice of medicine is not an exact science and that Practitioner makes  no warranties or guarantees regarding treatment outcomes. I acknowledge that I will receive a copy of this consent concurrently upon execution via email to the email address I last provided but may also request a printed copy by calling the office of [facility].    I understand that my insurance will be billed for this visit.   I have read or had this consent read to me. . I understand the contents of this consent, which adequately explains the benefits and risks of the Services being provided via telemedicine.  . I have been provided ample opportunity to ask questions regarding this consent and the Services and have had my questions answered to my satisfaction. . I give my informed consent for the services to be provided through the use of telemedicine in my medical care  By participating in this telemedicine visit I agree to the above.

## 2019-08-11 ENCOUNTER — Ambulatory Visit (HOSPITAL_COMMUNITY): Payer: BC Managed Care – PPO | Attending: Vascular Surgery

## 2019-08-11 ENCOUNTER — Encounter (HOSPITAL_COMMUNITY): Payer: BC Managed Care – PPO

## 2019-08-11 ENCOUNTER — Ambulatory Visit: Payer: BC Managed Care – PPO | Admitting: Vascular Surgery

## 2019-08-11 ENCOUNTER — Ambulatory Visit (HOSPITAL_COMMUNITY): Payer: BC Managed Care – PPO

## 2019-08-11 ENCOUNTER — Encounter: Payer: Self-pay | Admitting: Family

## 2020-02-02 ENCOUNTER — Other Ambulatory Visit: Payer: Self-pay | Admitting: Gastroenterology

## 2020-02-02 DIAGNOSIS — E43 Unspecified severe protein-calorie malnutrition: Secondary | ICD-10-CM

## 2020-02-02 DIAGNOSIS — Z9884 Bariatric surgery status: Secondary | ICD-10-CM

## 2020-07-11 ENCOUNTER — Telehealth: Payer: Self-pay

## 2020-07-11 NOTE — Telephone Encounter (Signed)
Pt called triage after being d/c from ED in [place] last night. He states he has a R leg "blood clot" and had one in his L leg in 2019. He also spoke to Dr. Elyn Peers (his PCP) who told him to contact our office to be seen. Per PA, we have ordered IVC duplex and R LE venous duplex for Friday and to be seen by MD next week. I have tried several times to call pt back to make him aware of these appts; left him VM to call us back to confirm he rec'd the message.

## 2020-07-13 ENCOUNTER — Ambulatory Visit (HOSPITAL_COMMUNITY)
Admission: RE | Admit: 2020-07-13 | Discharge: 2020-07-13 | Disposition: A | Discharge status: Home / Self Care | Payer: BC Managed Care – PPO | Source: Ambulatory Visit | Attending: Family | Admitting: Family

## 2020-07-13 ENCOUNTER — Other Ambulatory Visit: Payer: Self-pay

## 2020-07-13 ENCOUNTER — Other Ambulatory Visit (HOSPITAL_COMMUNITY): Payer: Self-pay | Admitting: Vascular Surgery

## 2020-07-13 ENCOUNTER — Telehealth: Payer: Self-pay | Admitting: *Deleted

## 2020-07-13 ENCOUNTER — Encounter: Payer: Self-pay | Admitting: *Deleted

## 2020-07-13 ENCOUNTER — Encounter (HOSPITAL_COMMUNITY): Payer: BC Managed Care – PPO

## 2020-07-13 ENCOUNTER — Other Ambulatory Visit: Payer: Self-pay | Admitting: Vascular Surgery

## 2020-07-13 DIAGNOSIS — M79604 Pain in right leg: Secondary | ICD-10-CM

## 2020-07-13 NOTE — Telephone Encounter (Signed)
Per Dr. Randie Heinz, pt does not need any ultrasound studies before his appointment with Dr. Darrick Penna on 9/2. He had LE venous DVT study in [facility].

## 2020-07-19 ENCOUNTER — Encounter: Payer: Self-pay | Admitting: Vascular Surgery

## 2020-07-19 ENCOUNTER — Other Ambulatory Visit: Payer: Self-pay

## 2020-07-19 ENCOUNTER — Encounter: Payer: Self-pay | Admitting: *Deleted

## 2020-07-19 ENCOUNTER — Other Ambulatory Visit: Payer: Self-pay | Admitting: *Deleted

## 2020-07-19 ENCOUNTER — Ambulatory Visit: Payer: BC Managed Care – PPO | Admitting: Vascular Surgery

## 2020-07-19 VITALS — BP 120/79 | HR 58 | Temp 98.1°F | Resp 20 | Ht 69.0 in | Wt 146.0 lb

## 2020-07-19 DIAGNOSIS — I82411 Acute embolism and thrombosis of right femoral vein: Secondary | ICD-10-CM | POA: Diagnosis not present

## 2020-07-19 MED ORDER — RIVAROXABAN (XARELTO) VTE STARTER PACK (15 & 20 MG): ORAL_TABLET | ORAL | 0 refills | Status: DC

## 2020-07-19 MED ORDER — ENOXAPARIN SODIUM 40 MG/0.4ML ~~LOC~~ SOLN: 20.0000 mg | Freq: Two times a day (BID) | SUBCUTANEOUS | 0 refills | Status: DC

## 2020-07-19 NOTE — Progress Notes (Signed)
Referring Physician: Mauricio Po  Patient name: Thomas Huerta MRN: [id number] DOB: 25-Nov-1955 Sex: male  REASON FOR CONSULT: Right leg DVT  HPI: Thomas Huerta is a 64 y.o. male, known to me from prior left iliac venous stent after thrombolysis in 2019.  Patient states he had an accident recently where a tree branch hit his right leg.  He then noted right leg swelling.  He apparently was seen at [facility] on August 24 and had an ultrasound performed which showed right popliteal and femoral vein DVT.  For some reason he was not started on anticoagulation at that point.  He was referred here for further evaluation.  He has not had any shortness of breath or hemoptysis.  His leg swelling has not improved.  He was previously on Xarelto after his iliac stent procedure but has not taken this recently.  He did not like the way it made him feel.  Other medical problems include nutrition secondary to gastric bypass which has been stable.  Past Medical History:  Diagnosis Date  . Back pain    chronic  . Complication of anesthesia    over medicated with Fentanyl  . History of kidney stones    5th time treating stones in 15 years  . Obesity    history of prior to gastric bypass  . Peripheral arterial disease (HCC)   . Peripheral vascular disease [facility])    Past Surgical History:  Procedure Laterality Date  . ANGIOPLASTY ILLIAC ARTERY Left 09/11/2018   Procedure: BALLOON ANGIOPLASTY LEFT ILIAC VEIN;  Surgeon: Cephus Shelling, MD;  Location: [facility] OR;  Service: Vascular;  Laterality: Left;  . CYST EXCISION     left lower back beside the spine  . CYSTOSCOPY    . CYSTOSCOPY W/ URETERAL STENT REMOVAL Left 08/18/2017   Procedure: CYSTOSCOPY WITH STENT REMOVAL;  Surgeon: Orson Ape, MD;  Location: [facility] ORS;  Service: Urology;  Laterality: Left;  . CYSTOSCOPY WITH STENT PLACEMENT Left 06/16/2017   Procedure: CYSTOSCOPY WITH STENT PLACEMENT;  Surgeon: Orson Ape, MD;   Location: [facility] ORS;  Service: Urology;  Laterality: Left;  . ESOPHAGOGASTRODUODENOSCOPY (EGD) WITH PROPOFOL N/A 12/14/2018   Procedure: ESOPHAGOGASTRODUODENOSCOPY (EGD) WITH PROPOFOL;  Surgeon: Toney Reil, MD;  Location[facility] ENDOSCOPY;  Service: Gastroenterology;  Laterality: N/A;  . EXTRACORPOREAL SHOCK WAVE LITHOTRIPSY Left 06/25/2017   Procedure: EXTRACORPOREAL SHOCK WAVE LITHOTRIPSY (ESWL);  Surgeon: Orson Ape, MD;  Location: [facility] ORS;  Service: Urology;  Laterality: Left;  . EXTRACORPOREAL SHOCK WAVE LITHOTRIPSY Left 08/06/2017   Procedure: EXTRACORPOREAL SHOCK WAVE LITHOTRIPSY (ESWL);  Surgeon: Orson Ape, MD;  Location: [facility] ORS;  Service: Urology;  Laterality: Left;  . EXTRACORPOREAL SHOCK WAVE LITHOTRIPSY Right 11/05/2017   Procedure: EXTRACORPOREAL SHOCK WAVE LITHOTRIPSY (ESWL);  Surgeon: Orson Ape, MD;  Location: [facility] ORS;  Service: Urology;  Laterality: Right;  . GASTRIC BYPASS  2004  . HERNIA REPAIR Bilateral    inguinal hernias  . INSERTION OF ILIAC STENT Left 09/11/2018   Procedure: INSERTION OF LEFT ILIAC STENT;  Surgeon: Cephus Shelling, MD;  Location: [facility] OR;  Service: Vascular;  Laterality: Left;  . INTRAVASCULAR ULTRASOUND/IVUS N/A 09/11/2018   Procedure: INTRAVASCULAR ULTRASOUND/IVUS;  Surgeon: Cephus Shelling, MD;  Location: [facility] OR;  Service: Vascular;  Laterality: N/A;  . LOWER EXTREMITY VENOGRAPHY Left 09/10/2018   Procedure: LOWER EXTREMITY VENOGRAPHY;  Surgeon: Sherren Kerns, MD;  Location: [facility] INVASIVE CV LAB;  Service: Cardiovascular;  Laterality:  Left;  . PERIPHERAL VASCULAR THROMBECTOMY Left 09/10/2018   Procedure: PERIPHERAL VASCULAR THROMBECTOMY;  Surgeon: Sherren KernsFields, Eleazar Kimmey E, MD;  Location: [facility] INVASIVE CV LAB;  Service: Cardiovascular;  Laterality: Left;  lower extremity venous system.  . TONSILLECTOMY     and addenoids  . TONSILLECTOMY AND ADENOIDECTOMY    . URETEROSCOPY WITH HOLMIUM LASER LITHOTRIPSY Left 06/16/2017    Procedure: URETEROSCOPY WITH HOLMIUM LASER LITHOTRIPSY;  Surgeon: Orson ApeWolff, Michael R, MD;  Location: [facility] ORS;  Service: Urology;  Laterality: Left;  Marland Kitchen. VENOGRAM Left 09/11/2018   Procedure: VENOGRAM LEFT LEG;  Surgeon: Cephus Shellinglark, Christopher J, MD;  [facility];  Service: Vascular;  Laterality: Left;    Family History  Problem Relation Age of Onset  . Lymphoma Mother   . Colon cancer Father     SOCIAL HISTORY: Social History   Socioeconomic History  . Marital status: Married    Spouse name: Not on file  . Number of children: Not on file  . Years of education: Not on file  . Highest education level: Not on file  Occupational History  . Not on file  Tobacco Use  . Smoking status: Former Smoker    Packs/day: 1.00    Types: Cigarettes    Quit date: 11/01/2018    Years since quitting: 1.7  . Smokeless tobacco: Never Used  Vaping Use  . Vaping Use: Never used  Substance and Sexual Activity  . Alcohol use: No  . Drug use: Yes    Types: Oxycodone    Comment: past history age 64 with perscription Percocet  . Sexual activity: Not on file  Other Topics Concern  . Not on file  Social History Narrative  . Not on file   Social Determinants of Health   Financial Resource Strain:   . Difficulty of Paying Living Expenses: Not on file  Food Insecurity:   . Worried About Programme researcher, broadcasting/film/videounning Out of Food in the Last Year: Not on file  . Ran Out of Food in the Last Year: Not on file  Transportation Needs:   . Lack of Transportation (Medical): Not on file  . Lack of Transportation (Non-Medical): Not on file  Physical Activity:   . Days of Exercise per Week: Not on file  . Minutes of Exercise per Session: Not on file  Stress:   . Feeling of Stress : Not on file  Social Connections:   . Frequency of Communication with Friends and Family: Not on file  . Frequency of Social Gatherings with Friends and Family: Not on file  . Attends Religious Services: Not on file  . Active Member of Clubs or  Organizations: Not on file  . Attends BankerClub or Organization Meetings: Not on file  . Marital Status: Not on file  Intimate Partner Violence:   . Fear of Current or Ex-Partner: Not on file  . Emotionally Abused: Not on file  . Physically Abused: Not on file  . Sexually Abused: Not on file    Allergies  Allergen Reactions  . Fentanyl Other (See Comments)    Sedated for days    Current Outpatient Medications  Medication Sig Dispense Refill  . baclofen (LIORESAL) 10 MG tablet Take 5 mg by mouth 3 (three) times daily as needed.    . Buprenorphine HCl-Naloxone HCl 4-1 MG FILM PLACE 1 TABLET UNDER THE TONGUE AND LET DISSOVLE 2 TIMES DAILY.    Marland Kitchen. Cholecalciferol 25 MCG (1000 UT) capsule Take by mouth.    . indomethacin (INDOCIN) 25 MG  capsule Take 25 mg by mouth 3 (three) times daily.    . Multiple Vitamins-Minerals (MULTIVITAMIN WITH MINERALS) tablet Take 1 tablet by mouth daily.    . tamsulosin (FLOMAX) 0.4 MG CAPS capsule Take by mouth.    . vitamin B-12 (CYANOCOBALAMIN) 500 MCG tablet Take 1 tablet (500 mcg total) by mouth daily. 90 tablet 3   No current facility-administered medications for this visit.    ROS:   General:  No weight loss, Fever, chills  HEENT: No recent headaches, no nasal bleeding, no visual changes, no sore throat  Neurologic: No dizziness, blackouts, seizures. No recent symptoms of stroke or mini- stroke. No recent episodes of slurred speech, or temporary blindness.  Cardiac: No recent episodes of chest pain/pressure, no shortness of breath at rest.  No shortness of breath with exertion.  Denies history of atrial fibrillation or irregular heartbeat  Vascular: No history of rest pain in feet.  No history of claudication.  No history of non-healing ulcer, + history of DVT   Pulmonary: No home oxygen, no productive cough, no hemoptysis,  No asthma or wheezing  Musculoskeletal:  [ ]  Arthritis, [ ]  Low back pain,  [ ]  Joint pain  Hematologic:No history of  hypercoagulable state.  No history of easy bleeding.  No history of anemia  Gastrointestinal: No hematochezia or melena,  No gastroesophageal reflux, no trouble swallowing  Urinary: [ ]  chronic Kidney disease, [ ]  on HD - [ ]  MWF or [ ]  TTHS, [ ]  Burning with urination, [ ]  Frequent urination, [ ]  Difficulty urinating;   Skin: No rashes  Psychological: No history of anxiety,  No history of depression   Physical Examination  Vitals:   07/19/20 1342  BP: 120/79  Pulse: (!) 58  Resp: 20  Temp: 98.1 F (36.7 C)  SpO2: 95%  Weight: 146 lb (66.2 kg)  Height: 5\' 9"  (1.753 m)    Body mass index is 21.56 kg/m.  General:  Alert and oriented, no acute distress HEENT: Normal Neck: No JVD Cardiac: Regular Rate and Rhythm  Extremity Pulses:  2+ radial, brachial, femoral, dorsalis pedis, posterior tibial pulses bilaterally Musculoskeletal: No deformity right leg edema extending from the foot to the thigh approximately twice the size of his left leg Neurologic: Upper and lower extremity motor 5/5 and symmetric  DATA:  I reviewed her recent duplex exam from [facility] dated July 10, 2020 which shows right popliteal and femoral DVT no evidence of DVT in the left leg  ASSESSMENT: Acute right leg DVT with significant edema.  I discussed with the patient the signs and symptoms of pulmonary embolus and the importance of starting anticoagulation immediately.  We discussed the possibility of hospital admission and IV heparin but unfortunately due to the current Covid pandemic there are minimal beds available.  In light of this we will start him on home Lovenox today.  He was given a prescription for Lovenox 0.6 mL to inject twice daily starting today.  I also gave him a prescription for a Xarelto starter pack and maintenance therapy to start after his thrombolysis procedure.  He'll be scheduled for Tuesday July 24, 2020 with Dr. for popliteal femoral thrombolysis and most likely  start his Xarelto post procedure.  Risk benefits possible complications and procedure details were discussed with the patient today including not limited to bleeding pulmonary embolus vessel injury.  He understands and agrees to proceed.  I also discussed with patient today that since he has now had  2 DVT episodes most likely he will require lifelong anticoagulation.   PLAN:   See above   Fabienne Bruns, MD [facility] Office: [phone]

## 2020-07-21 ENCOUNTER — Other Ambulatory Visit (HOSPITAL_COMMUNITY)
Admission: RE | Admit: 2020-07-21 | Discharge: 2020-07-21 | Disposition: A | Discharge status: Home / Self Care | Payer: BC Managed Care – PPO | Source: Ambulatory Visit | Attending: Surgery | Admitting: Surgery

## 2020-07-21 DIAGNOSIS — Z01812 Encounter for preprocedural laboratory examination: Secondary | ICD-10-CM | POA: Insufficient documentation

## 2020-07-21 DIAGNOSIS — Z20822 Contact with and (suspected) exposure to covid-19: Secondary | ICD-10-CM | POA: Diagnosis not present

## 2020-07-21 LAB — SARS CORONAVIRUS 2 (TAT 6-24 HRS): SARS Coronavirus 2: NEGATIVE

## 2020-07-23 ENCOUNTER — Telehealth: Payer: Self-pay | Admitting: Vascular Surgery

## 2020-07-23 NOTE — Telephone Encounter (Signed)
Called patient today to tell him we were rescheduling his venous procedure to Wednesday, July 25, 2020 with Dr. Chestine Spore.  He will plan on showing up at the same time and dates as previously scheduled unless he hears differently from our office.  Fabienne Bruns, MD [facility] [place] Office: [phone]

## 2020-07-25 ENCOUNTER — Ambulatory Visit (HOSPITAL_COMMUNITY)
Admission: RE | Admit: 2020-07-25 | Discharge: 2020-07-25 | Disposition: A | Discharge status: Home / Self Care | Payer: BC Managed Care – PPO | Attending: Vascular Surgery | Admitting: Vascular Surgery

## 2020-07-25 ENCOUNTER — Encounter (HOSPITAL_COMMUNITY): Payer: Self-pay | Admitting: Vascular Surgery

## 2020-07-25 ENCOUNTER — Encounter (HOSPITAL_COMMUNITY)
Admission: RE | Disposition: A | Discharge status: Home / Self Care | Payer: Self-pay | Source: Home / Self Care | Attending: Vascular Surgery

## 2020-07-25 ENCOUNTER — Other Ambulatory Visit: Payer: Self-pay

## 2020-07-25 DIAGNOSIS — Z9884 Bariatric surgery status: Secondary | ICD-10-CM | POA: Insufficient documentation

## 2020-07-25 DIAGNOSIS — Z885 Allergy status to narcotic agent status: Secondary | ICD-10-CM | POA: Diagnosis not present

## 2020-07-25 DIAGNOSIS — Z87891 Personal history of nicotine dependence: Secondary | ICD-10-CM | POA: Insufficient documentation

## 2020-07-25 DIAGNOSIS — E669 Obesity, unspecified: Secondary | ICD-10-CM | POA: Diagnosis not present

## 2020-07-25 DIAGNOSIS — I739 Peripheral vascular disease, unspecified: Secondary | ICD-10-CM | POA: Insufficient documentation

## 2020-07-25 DIAGNOSIS — Z6821 Body mass index (BMI) 21.0-21.9, adult: Secondary | ICD-10-CM | POA: Insufficient documentation

## 2020-07-25 DIAGNOSIS — I82411 Acute embolism and thrombosis of right femoral vein: Secondary | ICD-10-CM | POA: Diagnosis present

## 2020-07-25 DIAGNOSIS — Z79899 Other long term (current) drug therapy: Secondary | ICD-10-CM | POA: Insufficient documentation

## 2020-07-25 DIAGNOSIS — I82421 Acute embolism and thrombosis of right iliac vein: Secondary | ICD-10-CM | POA: Diagnosis not present

## 2020-07-25 HISTORY — PX: PERIPHERAL VASCULAR INTERVENTION: CATH118257

## 2020-07-25 HISTORY — PX: PERIPHERAL VASCULAR THROMBECTOMY: CATH118306

## 2020-07-25 LAB — POCT I-STAT, CHEM 8
BUN: 13 mg/dL (ref 8–23)
Calcium, Ion: 1.18 mmol/L (ref 1.15–1.40)
Chloride: 106 mmol/L (ref 98–111)
Creatinine, Ser: 0.8 mg/dL (ref 0.61–1.24)
Glucose, Bld: 78 mg/dL (ref 70–99)
HCT: 33 % — ABNORMAL LOW (ref 39.0–52.0)
Hemoglobin: 11.2 g/dL — ABNORMAL LOW (ref 13.0–17.0)
Potassium: 3.6 mmol/L (ref 3.5–5.1)
Sodium: 143 mmol/L (ref 135–145)
TCO2: 27 mmol/L (ref 22–32)

## 2020-07-25 SURGERY — PERIPHERAL VASCULAR THROMBECTOMY
Anesthesia: LOCAL | Laterality: Right

## 2020-07-25 MED ORDER — RIVAROXABAN 20 MG PO TABS: 20.0000 mg | ORAL_TABLET | Freq: Every day | ORAL | Status: DC

## 2020-07-25 MED ORDER — SODIUM CHLORIDE 0.9 % IV SOLN: INTRAVENOUS | Status: DC

## 2020-07-25 MED ORDER — ACETAMINOPHEN 325 MG PO TABS: 650.0000 mg | ORAL_TABLET | ORAL | Status: DC | PRN

## 2020-07-25 MED ORDER — SODIUM CHLORIDE 0.9% FLUSH: 3.0000 mL | Freq: Two times a day (BID) | INTRAVENOUS | Status: DC

## 2020-07-25 MED ORDER — HEPARIN SODIUM (PORCINE) 1000 UNIT/ML IJ SOLN
INTRAMUSCULAR | Status: AC
  Filled 2020-07-25: qty 1

## 2020-07-25 MED ORDER — HYDROMORPHONE HCL 1 MG/ML IJ SOLN
INTRAMUSCULAR | Status: AC
  Filled 2020-07-25: qty 0.5

## 2020-07-25 MED ORDER — SODIUM CHLORIDE 0.9 % IV SOLN: 250.0000 mL | INTRAVENOUS | Status: DC | PRN

## 2020-07-25 MED ORDER — RIVAROXABAN 15 MG PO TABS: 15.0000 mg | ORAL_TABLET | Freq: Two times a day (BID) | ORAL | Status: DC

## 2020-07-25 MED ORDER — HYDROMORPHONE HCL 1 MG/ML IJ SOLN
INTRAMUSCULAR | Status: DC | PRN
  Administered 2020-07-25 (×3): 0.5 mg via INTRAVENOUS

## 2020-07-25 MED ORDER — LABETALOL HCL 5 MG/ML IV SOLN: 10.0000 mg | INTRAVENOUS | Status: DC | PRN

## 2020-07-25 MED ORDER — LIDOCAINE HCL (PF) 1 % IJ SOLN
INTRAMUSCULAR | Status: AC
  Filled 2020-07-25: qty 30

## 2020-07-25 MED ORDER — RIVAROXABAN 15 MG PO TABS
15.0000 mg | ORAL_TABLET | Freq: Once | ORAL | Status: AC
  Administered 2020-07-25: 15 mg via ORAL
  Filled 2020-07-25: qty 1

## 2020-07-25 MED ORDER — HEPARIN SODIUM (PORCINE) 1000 UNIT/ML IJ SOLN
INTRAMUSCULAR | Status: DC | PRN
  Administered 2020-07-25: 7000 [IU] via INTRAVENOUS

## 2020-07-25 MED ORDER — HEPARIN (PORCINE) IN NACL 1000-0.9 UT/500ML-% IV SOLN
INTRAVENOUS | Status: AC
  Filled 2020-07-25: qty 500

## 2020-07-25 MED ORDER — HEPARIN (PORCINE) IN NACL 1000-0.9 UT/500ML-% IV SOLN
INTRAVENOUS | Status: DC | PRN
  Administered 2020-07-25: 500 mL

## 2020-07-25 MED ORDER — LIDOCAINE HCL (PF) 1 % IJ SOLN
INTRAMUSCULAR | Status: DC | PRN
  Administered 2020-07-25: 18 mL via INTRADERMAL

## 2020-07-25 MED ORDER — ONDANSETRON HCL 4 MG/2ML IJ SOLN: 4.0000 mg | Freq: Four times a day (QID) | INTRAMUSCULAR | Status: DC | PRN

## 2020-07-25 MED ORDER — HYDRALAZINE HCL 20 MG/ML IJ SOLN: 5.0000 mg | INTRAMUSCULAR | Status: DC | PRN

## 2020-07-25 MED ORDER — SODIUM CHLORIDE 0.9% FLUSH: 3.0000 mL | INTRAVENOUS | Status: DC | PRN

## 2020-07-25 MED ORDER — IODIXANOL 320 MG/ML IV SOLN
INTRAVENOUS | Status: DC | PRN
  Administered 2020-07-25: 30 mL via INTRAVENOUS

## 2020-07-25 MED ORDER — MIDAZOLAM HCL 2 MG/2ML IJ SOLN
INTRAMUSCULAR | Status: DC | PRN
  Administered 2020-07-25 (×2): 1 mg via INTRAVENOUS

## 2020-07-25 MED ORDER — MIDAZOLAM HCL 2 MG/2ML IJ SOLN
INTRAMUSCULAR | Status: AC
  Filled 2020-07-25: qty 2

## 2020-07-25 SURGICAL SUPPLY — 21 items
BAG SNAP BAND KOVER 36X36 (MISCELLANEOUS) ×3 IMPLANT
BALLN ATLAS 14X40X75 (BALLOONS) ×3
BALLOON ATLAS 14X40X75 (BALLOONS) ×2 IMPLANT
CATH ANGIO 5F BER2 100CM (CATHETERS) ×3 IMPLANT
CATH HEADHUNTER 5F 125CM (CATHETERS) ×3 IMPLANT
CATH RETRIEVER CLOT 16MMX105CM (CATHETERS) ×3 IMPLANT
CATH VISIONS PV .035 IVUS (CATHETERS) ×3 IMPLANT
COVER DOME SNAP 22 D (MISCELLANEOUS) ×3 IMPLANT
GLIDEWIRE ADV .035X260CM (WIRE) ×3 IMPLANT
KIT ENCORE 26 ADVANTAGE (KITS) ×3 IMPLANT
KIT MICROPUNCTURE NIT STIFF (SHEATH) ×3 IMPLANT
PROTECTION STATION PRESSURIZED (MISCELLANEOUS) ×3
SHEATH CLOT RETRIEVER (SHEATH) ×3 IMPLANT
SHEATH PINNACLE 8F 10CM (SHEATH) ×3 IMPLANT
SHEATH PROBE COVER 6X72 (BAG) ×3 IMPLANT
STATION PROTECTION PRESSURIZED (MISCELLANEOUS) ×2 IMPLANT
STENT WALLSTENTÂ 16X90X75 (Permanent Stent) ×3 IMPLANT
TRAY PV CATH (CUSTOM PROCEDURE TRAY) ×3 IMPLANT
WIRE AMPLATZ SS-J .035X260CM (WIRE) ×3 IMPLANT
WIRE BENTSON .035X145CM (WIRE) ×3 IMPLANT
WIRE TORQFLEX AUST .018X40CM (WIRE) ×3 IMPLANT

## 2020-07-25 NOTE — H&P (Signed)
History and Physical Interval Note:  07/25/2020 8:34 AM  Thomas Huerta  has presented today for surgery, with the diagnosis of right leg swelling.  The various methods of treatment have been discussed with the patient and family. After consideration of risks, benefits and other options for treatment, the patient has consented to  Procedure(s): PERIPHERAL VASCULAR THROMBECTOMY (Right) as a surgical intervention.  The patient's history has been reviewed, patient examined, no change in status, stable for surgery.  I have reviewed the patient's chart and labs.  Questions were answered to the patient's satisfaction.    Right leg DVT.  IVUS/venogram and possible intervention.    Cephus Shelling  Referring Physician: Mauricio Po  Patient name: Thomas Huerta  MRN: [id number]        DOB: 05-09-1956            Sex: male  REASON FOR CONSULT: Right leg DVT  HPI: Thomas Huerta is a 64 y.o. male, known to me from prior left iliac venous stent after thrombolysis in 2019.  Patient states he had an accident recently where a tree branch hit his right leg.  He then noted right leg swelling.  He apparently was seen at [facility] on August 24 and had an ultrasound performed which showed right popliteal and femoral vein DVT.  For some reason he was not started on anticoagulation at that point.  He was referred here for further evaluation.  He has not had any shortness of breath or hemoptysis.  His leg swelling has not improved.  He was previously on Xarelto after his iliac stent procedure but has not taken this recently.  He did not like the way it made him feel.  Other medical problems include nutrition secondary to gastric bypass which has been stable.      Past Medical History:  Diagnosis Date  . Back pain    chronic  . Complication of anesthesia    over medicated with Fentanyl  . History of kidney stones    5th time treating stones in 15 years  . Obesity    history of  prior to gastric bypass  . Peripheral arterial disease (HCC)   . Peripheral vascular disease [facility])         Past Surgical History:  Procedure Laterality Date  . ANGIOPLASTY ILLIAC ARTERY Left 09/11/2018   Procedure: BALLOON ANGIOPLASTY LEFT ILIAC VEIN;  Surgeon: Cephus Shelling, MD;  Location: [facility] OR;  Service: Vascular;  Laterality: Left;  . CYST EXCISION     left lower back beside the spine  . CYSTOSCOPY    . CYSTOSCOPY W/ URETERAL STENT REMOVAL Left 08/18/2017   Procedure: CYSTOSCOPY WITH STENT REMOVAL;  Surgeon: Orson Ape, MD;  Location: [facility] ORS;  Service: Urology;  Laterality: Left;  . CYSTOSCOPY WITH STENT PLACEMENT Left 06/16/2017   Procedure: CYSTOSCOPY WITH STENT PLACEMENT;  Surgeon: Orson Ape, MD;  Location: [facility] ORS;  Service: Urology;  Laterality: Left;  . ESOPHAGOGASTRODUODENOSCOPY (EGD) WITH PROPOFOL N/A 12/14/2018   Procedure: ESOPHAGOGASTRODUODENOSCOPY (EGD) WITH PROPOFOL;  Surgeon: Toney Reil, MD;  Location: [facility] ENDOSCOPY;  Service: Gastroenterology;  Laterality: N/A;  . EXTRACORPOREAL SHOCK WAVE LITHOTRIPSY Left 06/25/2017   Procedure: EXTRACORPOREAL SHOCK WAVE LITHOTRIPSY (ESWL);  Surgeon: Orson Ape, MD;  Location: [facility] ORS;  Service: Urology;  Laterality: Left;  . EXTRACORPOREAL SHOCK WAVE LITHOTRIPSY Left 08/06/2017   Procedure: EXTRACORPOREAL SHOCK WAVE LITHOTRIPSY (ESWL);  Surgeon: Orson Ape, MD;  Location: [facility] ORS;  Service: Urology;  Laterality: Left;  . EXTRACORPOREAL SHOCK WAVE LITHOTRIPSY Right 11/05/2017   Procedure: EXTRACORPOREAL SHOCK WAVE LITHOTRIPSY (ESWL);  Surgeon: Orson Ape, MD;  Location: [facility] ORS;  Service: Urology;  Laterality: Right;  . GASTRIC BYPASS  2004  . HERNIA REPAIR Bilateral    inguinal hernias  . INSERTION OF ILIAC STENT Left 09/11/2018   Procedure: INSERTION OF LEFT ILIAC STENT;  Surgeon: Cephus Shelling, MD;  Location: [facility] OR;  Service: Vascular;  Laterality: Left;  .  INTRAVASCULAR ULTRASOUND/IVUS N/A 09/11/2018   Procedure: INTRAVASCULAR ULTRASOUND/IVUS;  Surgeon: Cephus Shelling, MD;  Location: [facility] OR;  Service: Vascular;  Laterality: N/A;  . LOWER EXTREMITY VENOGRAPHY Left 09/10/2018   Procedure: LOWER EXTREMITY VENOGRAPHY;  Surgeon: Sherren Kerns, MD;  Location: [facility] INVASIVE CV LAB;  Service: Cardiovascular;  Laterality: Left;  . PERIPHERAL VASCULAR THROMBECTOMY Left 09/10/2018   Procedure: PERIPHERAL VASCULAR THROMBECTOMY;  Surgeon: Sherren Kerns, MD;  Location: [facility] INVASIVE CV LAB;  Service: Cardiovascular;  Laterality: Left;  lower extremity venous system.  . TONSILLECTOMY     and addenoids  . TONSILLECTOMY AND ADENOIDECTOMY    . URETEROSCOPY WITH HOLMIUM LASER LITHOTRIPSY Left 06/16/2017   Procedure: URETEROSCOPY WITH HOLMIUM LASER LITHOTRIPSY;  Surgeon: Orson Ape, MD;  Location: [facility] ORS;  Service: Urology;  Laterality: Left;  Marland Kitchen VENOGRAM Left 09/11/2018   Procedure: VENOGRAM LEFT LEG;  Surgeon: Cephus Shelling, MD;  Location: [facility] OR;  Service: Vascular;  Laterality: Left;    Family History  Problem Relation Age of Onset  . Lymphoma Mother   . Colon cancer Father     SOCIAL HISTORY: Social History        Socioeconomic History  . Marital status: Married    Spouse name: Not on file  . Number of children: Not on file  . Years of education: Not on file  . Highest education level: Not on file  Occupational History  . Not on file  Tobacco Use  . Smoking status: Former Smoker    Packs/day: 1.00    Types: Cigarettes    Quit date: 11/01/2018    Years since quitting: 1.7  . Smokeless tobacco: Never Used  Vaping Use  . Vaping Use: Never used  Substance and Sexual Activity  . Alcohol use: No  . Drug use: Yes    Types: Oxycodone    Comment: past history age 73 with perscription Percocet  . Sexual activity: Not on file  Other Topics Concern  . Not on file  Social History Narrative  .  Not on file   Social Determinants of Health      Financial Resource Strain:   . Difficulty of Paying Living Expenses: Not on file  Food Insecurity:   . Worried About Programme researcher, broadcasting/film/video in the Last Year: Not on file  . Ran Out of Food in the Last Year: Not on file  Transportation Needs:   . Lack of Transportation (Medical): Not on file  . Lack of Transportation (Non-Medical): Not on file  Physical Activity:   . Days of Exercise per Week: Not on file  . Minutes of Exercise per Session: Not on file  Stress:   . Feeling of Stress : Not on file  Social Connections:   . Frequency of Communication with Friends and Family: Not on file  . Frequency of Social Gatherings with Friends and Family: Not on file  . Attends Religious Services: Not on file  . Active Member of  Clubs or Organizations: Not on file  . Attends BankerClub or Organization Meetings: Not on file  . Marital Status: Not on file  Intimate Partner Violence:   . Fear of Current or Ex-Partner: Not on file  . Emotionally Abused: Not on file  . Physically Abused: Not on file  . Sexually Abused: Not on file         Allergies  Allergen Reactions  . Fentanyl Other (See Comments)    Sedated for days          Current Outpatient Medications  Medication Sig Dispense Refill  . baclofen (LIORESAL) 10 MG tablet Take 5 mg by mouth 3 (three) times daily as needed.    . Buprenorphine HCl-Naloxone HCl 4-1 MG FILM PLACE 1 TABLET UNDER THE TONGUE AND LET DISSOVLE 2 TIMES DAILY.    Marland Kitchen. Cholecalciferol 25 MCG (1000 UT) capsule Take by mouth.    . indomethacin (INDOCIN) 25 MG capsule Take 25 mg by mouth 3 (three) times daily.    . Multiple Vitamins-Minerals (MULTIVITAMIN WITH MINERALS) tablet Take 1 tablet by mouth daily.    . tamsulosin (FLOMAX) 0.4 MG CAPS capsule Take by mouth.    . vitamin B-12 (CYANOCOBALAMIN) 500 MCG tablet Take 1 tablet (500 mcg total) by mouth daily. 90 tablet 3   No current facility-administered  medications for this visit.    ROS:   General:  No weight loss, Fever, chills  HEENT: No recent headaches, no nasal bleeding, no visual changes, no sore throat  Neurologic: No dizziness, blackouts, seizures. No recent symptoms of stroke or mini- stroke. No recent episodes of slurred speech, or temporary blindness.  Cardiac: No recent episodes of chest pain/pressure, no shortness of breath at rest.  No shortness of breath with exertion.  Denies history of atrial fibrillation or irregular heartbeat  Vascular: No history of rest pain in feet.  No history of claudication.  No history of non-healing ulcer, + history of DVT   Pulmonary: No home oxygen, no productive cough, no hemoptysis,  No asthma or wheezing  Musculoskeletal:  [ ]  Arthritis, [ ]  Low back pain,  [ ]  Joint pain  Hematologic:No history of hypercoagulable state.  No history of easy bleeding.  No history of anemia  Gastrointestinal: No hematochezia or melena,  No gastroesophageal reflux, no trouble swallowing  Urinary: [ ]  chronic Kidney disease, [ ]  on HD - [ ]  MWF or [ ]  TTHS, [ ]  Burning with urination, [ ]  Frequent urination, [ ]  Difficulty urinating;   Skin: No rashes  Psychological: No history of anxiety,  No history of depression   Physical Examination     Vitals:   07/19/20 1342  BP: 120/79  Pulse: (!) 58  Resp: 20  Temp: 98.1 F (36.7 C)  SpO2: 95%  Weight: 146 lb (66.2 kg)  Height: 5\' 9"  (1.753 m)    Body mass index is 21.56 kg/m.  General:  Alert and oriented, no acute distress HEENT: Normal Neck: No JVD Cardiac: Regular Rate and Rhythm  Extremity Pulses:  2+ radial, brachial, femoral, dorsalis pedis, posterior tibial pulses bilaterally Musculoskeletal: No deformity right leg edema extending from the foot to the thigh approximately twice the size of his left leg Neurologic: Upper and lower extremity motor 5/5 and symmetric  DATA:  I reviewed her [facility], 2020 which shows right popliteal and femoral DVT no evidence of DVT in the left leg  ASSESSMENT: Acute right leg DVT  with significant edema.  I discussed with the patient the signs and symptoms of pulmonary embolus and the importance of starting anticoagulation immediately.  We discussed the possibility of hospital admission and IV heparin but unfortunately due to the current Covid pandemic there are minimal beds available.  In light of this we will start him on home Lovenox today.  He was given a prescription for Lovenox 0.6 mL to inject twice daily starting today.  I also gave him a prescription for a Xarelto starter pack and maintenance therapy to start after his thrombolysis procedure.  He'll be scheduled for Tuesday July 24, 2020 with Dr. Myra Gianotti for popliteal femoral thrombolysis and most likely start his Xarelto post procedure.  Risk benefits possible complications and procedure details were discussed with the patient today including not limited to bleeding pulmonary embolus vessel injury.  He understands and agrees to proceed.  I also discussed with patient today that since he has now had 2 DVT episodes most likely he will require lifelong anticoagulation.   PLAN:   See above   Fabienne Bruns, MD [facility] [place] Office: [phone]

## 2020-07-25 NOTE — Progress Notes (Signed)
Reviewed discharge instructions with pt and pt's wife. Questions answered and verbalized understanding.

## 2020-07-25 NOTE — Op Note (Signed)
Patient name: Thomas Huerta MRN: [id number] DOB: 07/23/1956 Sex: male  07/25/2020 Pre-operative Diagnosis: Extensive right leg DVT Post-operative diagnosis:  Same Surgeon:  Cephus Shelling, MD Procedure Performed: 1.  Ultrasound-guided access of right popliteal vein 2.  IVUS of inferior vena cava, right common and external iliac vein, right common femoral vein, right superficial femoral vein and right popliteal vein including right iliac venogram 3.  Percutaneous mechanical thrombectomy of right external iliac vein, common femoral vein, superficial femoral vein, and popliteal vein (Innari ClotTriever) 4.  Right common and external iliac vein angioplasty with stent placement (predilated with a 14 mm Atlas balloon and stented with a 16 mm x 90 mm Wallstent) 5.  63 minutes of monitored moderate conscious sedation time  Indications: 64 year old male who presented with acute severe right leg swelling with new evidence of right femoralpopliteal DVT and evaluated by Dr. Darrick Penna.  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  He presents today for right leg intervention after risk benefits discussed.  Findings:   The IVC and right common iliac vein were free of thrombus.  The right common iliac and external iliac vein had approximate 80% stenosis with subacute thrombus in the right external iliac vein, common femoral vein, profunda vein, superficial femoral vein and popliteal vein.  Ultimately percutaneous mechanical thrombectomy was made of the right iliac as well as the common femoral and superficial femoral and popliteal vein with retrieval of large volumes of subacute thrombus after 4 passes.  We then elected to treat the right common and external iliac vein stenosis that was predilated with a 14 mm Atlas stented with a 16 mm x 90 mm Wallstent.  The stent appeared widely patent on IVUS  with no residual stenosis in the right iliac venous system.   Procedure:  The patient was identified in the holding area and taken to room 8.  The patient was then placed prone on the table.  Subsequently the right popliteal fossa was prepped and draped in usual sterile fashion.  I used sterile ultrasound probe to evaluate the right small saphenous and popliteal veins that all appear to have acute and subacute thrombus.  Subsequently under ultrasound guidance the right popliteal vein was accessed with a micro needle and then placed a microwire and a micro sheath.  I then used a Glidewire advantage and placed a short 8 French sheath in the right popliteal vein.  Patient was given 100 units/kg heparin.  I then used a BER 2 catheter with a Glidewire advantage to cross the superficial femoral vein common femoral vein thrombus into the right iliac vein and IVC.  Ultimately I did shoot a brief venogram of the right common iliac vein and IVC to ensure that the left iliac vein stent did not cross the bifurcation which it did not.  That point in time I then performed IVUS of the right popliteal, superficial femoral, common femoral, right external and common iliac vein as well as IVC with pertinent findings noted above.  We then elected to perform percutaneous mechanical thrombectomy and exchanged for the 13 French Innari sheath in the right popliteal vein and then the Innari ClotTreiver was deployed in the IVC after parking the tip of our Glidewire advantage into the right subclavian vein.  We made a total of four passes after deploying the basket in the IVC and retrieved fairly large volumes of mostly subacute appearing  thrombus.  We then performed IVUS again and the right external iliac, common femoral vein and most of the superficial femoral vein were all free of thrombus.  That point in time we did note a 80% stenosis in the right external and common iliac vein.  We elected to predilate this with a 14 mm Atlas and then  placed a 16 mm x 90 mm Wallstent in the right common and external iliac vein and this was postdilated with a 14 mm Atlas.  Final IVUS again showed a widely patent stent with no thrombus and good wall apposition in the right iliac venous sytem.  That point time wires and catheters were removed.  A 4-0 pursestring was tied down and the Innari sheath was removed.  Taken to PACU in stable condition.   Plan: Start Xarelto today (first dose given here in the hospital) and prescription already sent to pharmacy by Dr. Darrick Penna.  Will arrange follow-up in one month with iliac vein duplex.  Cephus Shelling, MD [facility] [place] Office: [phone]   Cephus Shelling

## 2020-07-25 NOTE — Discharge Instructions (Signed)
First 15 mg Xarelto has been given. You will take second 15 mg tonight (07/25/20). Starting tomorrow you will take 20 mg once a day    Popliteal Site Care This sheet gives you information about how to care for yourself after your procedure. Your health care provider may also give you more specific instructions. If you have problems or questions, contact your health care provider. What can I expect after the procedure? After the procedure, it is common to have:  Bruising that usually fades within 1-2 weeks.  Tenderness at the site. Follow these instructions at home: Wound care 1. Follow instructions from your health care provider about how to take care of your insertion site. Make sure you: ? Wash your hands with soap and water before you change your bandage (dressing). If soap and water are not available, use hand sanitizer. ? Remove your dressing as told by your health care provider. 2. Do not take baths, swim, or use a hot tub until your health care provider approves. 3. You may shower 24-48 hours after the procedure or as told by your health care provider. ? Gently wash the site with plain soap and water. ? Pat the area dry with a clean towel. ? Do not rub the site. This may cause bleeding. 4. Do not apply powder or lotion to the site. Keep the site clean and dry. 5. Check your popliteal site every day for signs of infection. Check for: ? Redness, swelling, or pain. ? Fluid or blood. ? Warmth. ? Pus or a bad smell. Activity 1. For the first 2-3 days after your procedure, or as long as directed: ? Avoid climbing stairs as much as possible. ? Do not squat. 2. Do not lift anything that is heavier than 10 lb (4.5 kg), or the limit that you are told, until your health care provider says that it is safe. For 3 days 3. Rest as directed. ? Avoid sitting for a long time without moving. Get up to take short walks every 1-2 hours. 4. Do not drive for 24 hours if you were given a medicine to  help you relax (sedative). General instructions  Take over-the-counter and prescription medicines only as told by your health care provider.  Keep all follow-up visits as told by your health care provider. This is important. Contact a health care provider if you have:  A fever or chills.  You have redness, swelling, or pain around your insertion site. Get help right away if:  The catheter insertion area swells very fast.  You pass out.  You suddenly start to sweat or your skin gets clammy.  The catheter insertion area is bleeding, and the bleeding does not stop when you hold steady pressure on the area. These symptoms may represent a serious problem that is an emergency. Do not wait to see if the symptoms will go away. Get medical help right away. Call your local emergency services (911 in the [place] Do not drive yourself to the hospital. Summary  After the procedure, it is common to have bruising that usually fades within 1-2 weeks.  Check your site every day for signs of infection.  Do not lift anything that is heavier than 10 lb (4.5 kg), or the limit that you are told, until your health care provider says that it is safe. This information is not intended to replace advice given to you by your health care provider. Make sure you discuss any questions you have with your health care provider.  Document Revised: 11/16/2017 Document Reviewed: 11/16/2017 Elsevier Patient Education  2020 [employer].

## 2020-07-27 ENCOUNTER — Telehealth: Payer: Self-pay | Admitting: *Deleted

## 2020-07-27 NOTE — Telephone Encounter (Signed)
Pt was in work queue for DVT however he's already an EST pt. Per Melissa Per I reached out to him to get him sched to RTC as requested, but pt stated that he doesn't feel that he needs to be seen and that he was going to reach out to his PCP in reference to the referral. Pt refused to have any appts sched at this time.

## 2020-07-30 NOTE — Telephone Encounter (Signed)
I will notify referring provider

## 2020-08-04 IMAGING — CR DG CHEST 2V
2 series · 2 of 2 positions shown · non-contrast
Comparison: 11/03/2018

CLINICAL DATA: History of deep venous thrombosis with thrombectomy

EXAM:
CHEST - 2 VIEW

[chest pa]
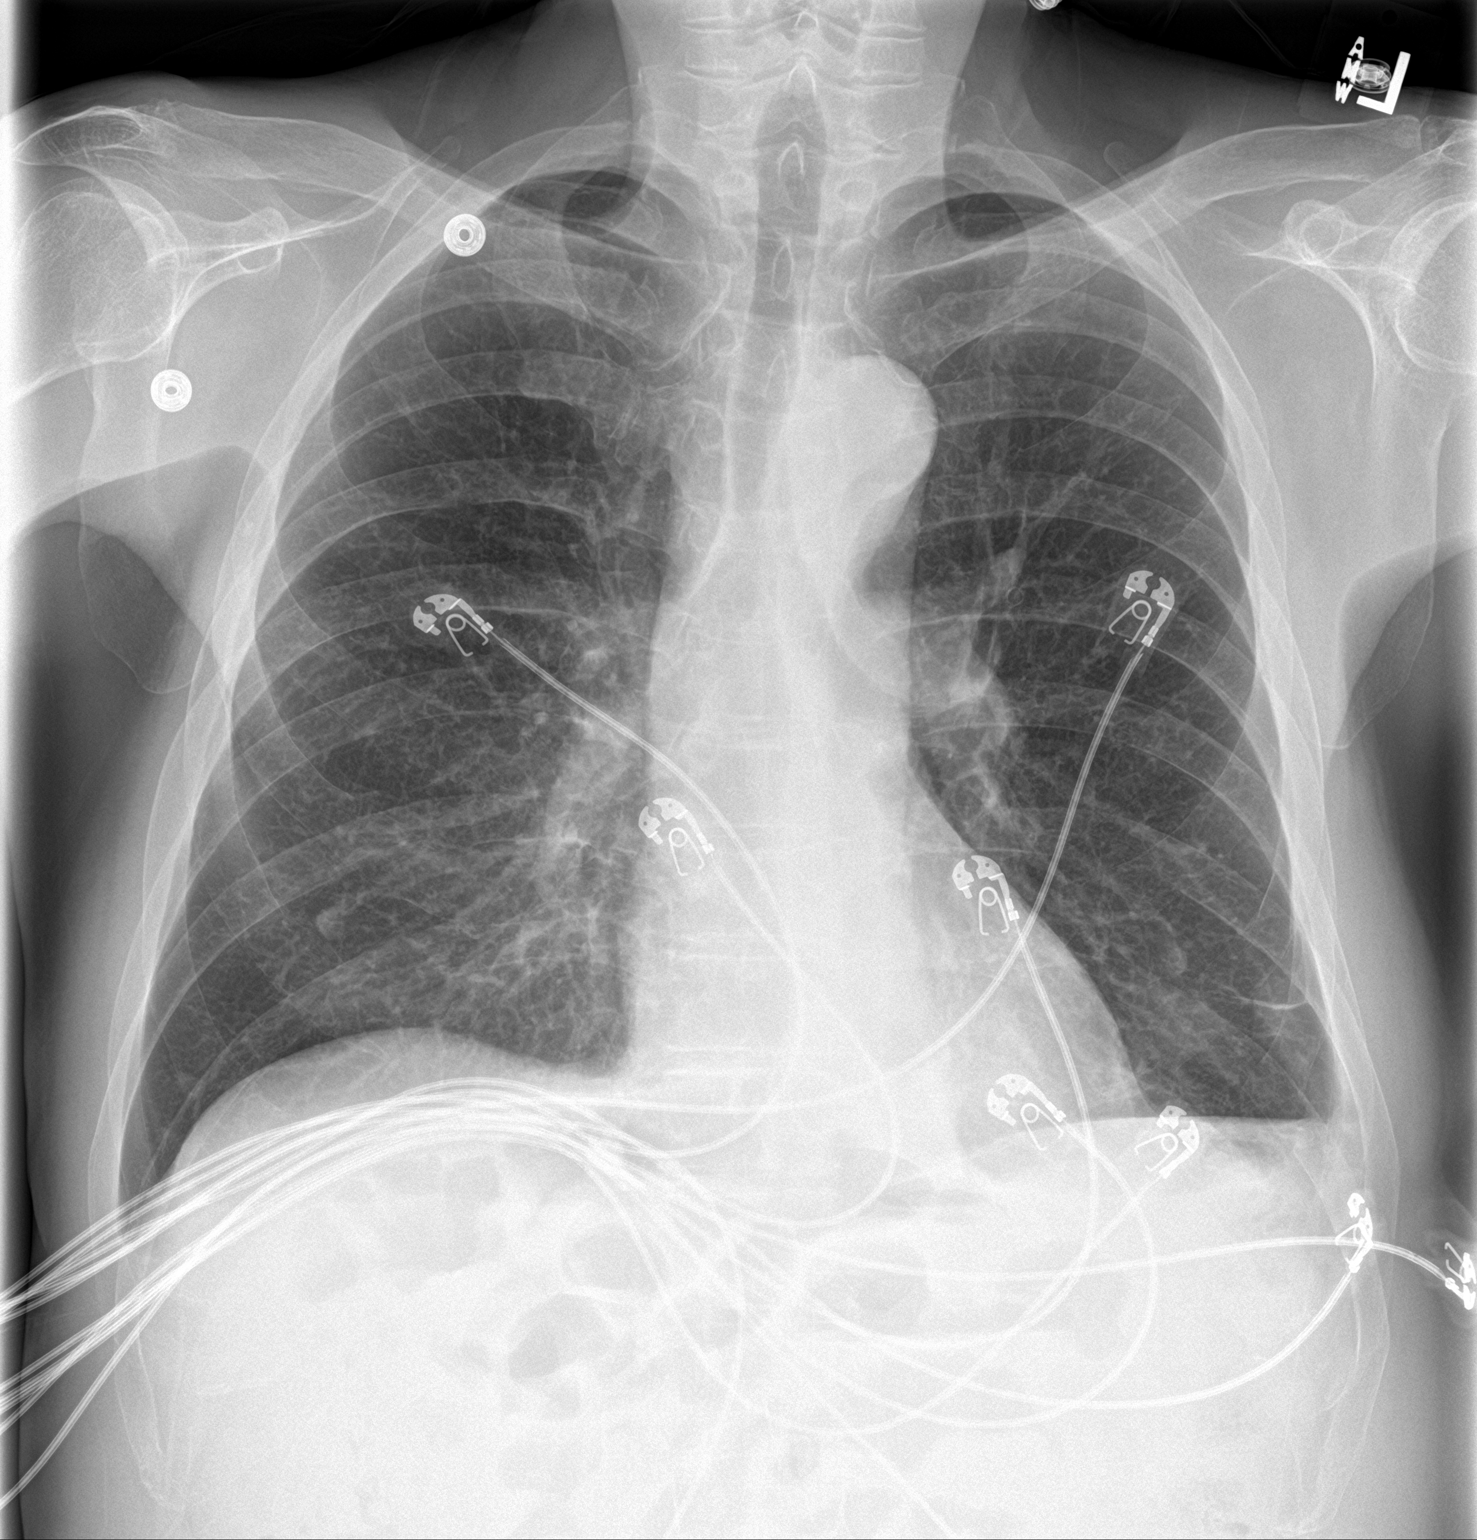

[chest lat]
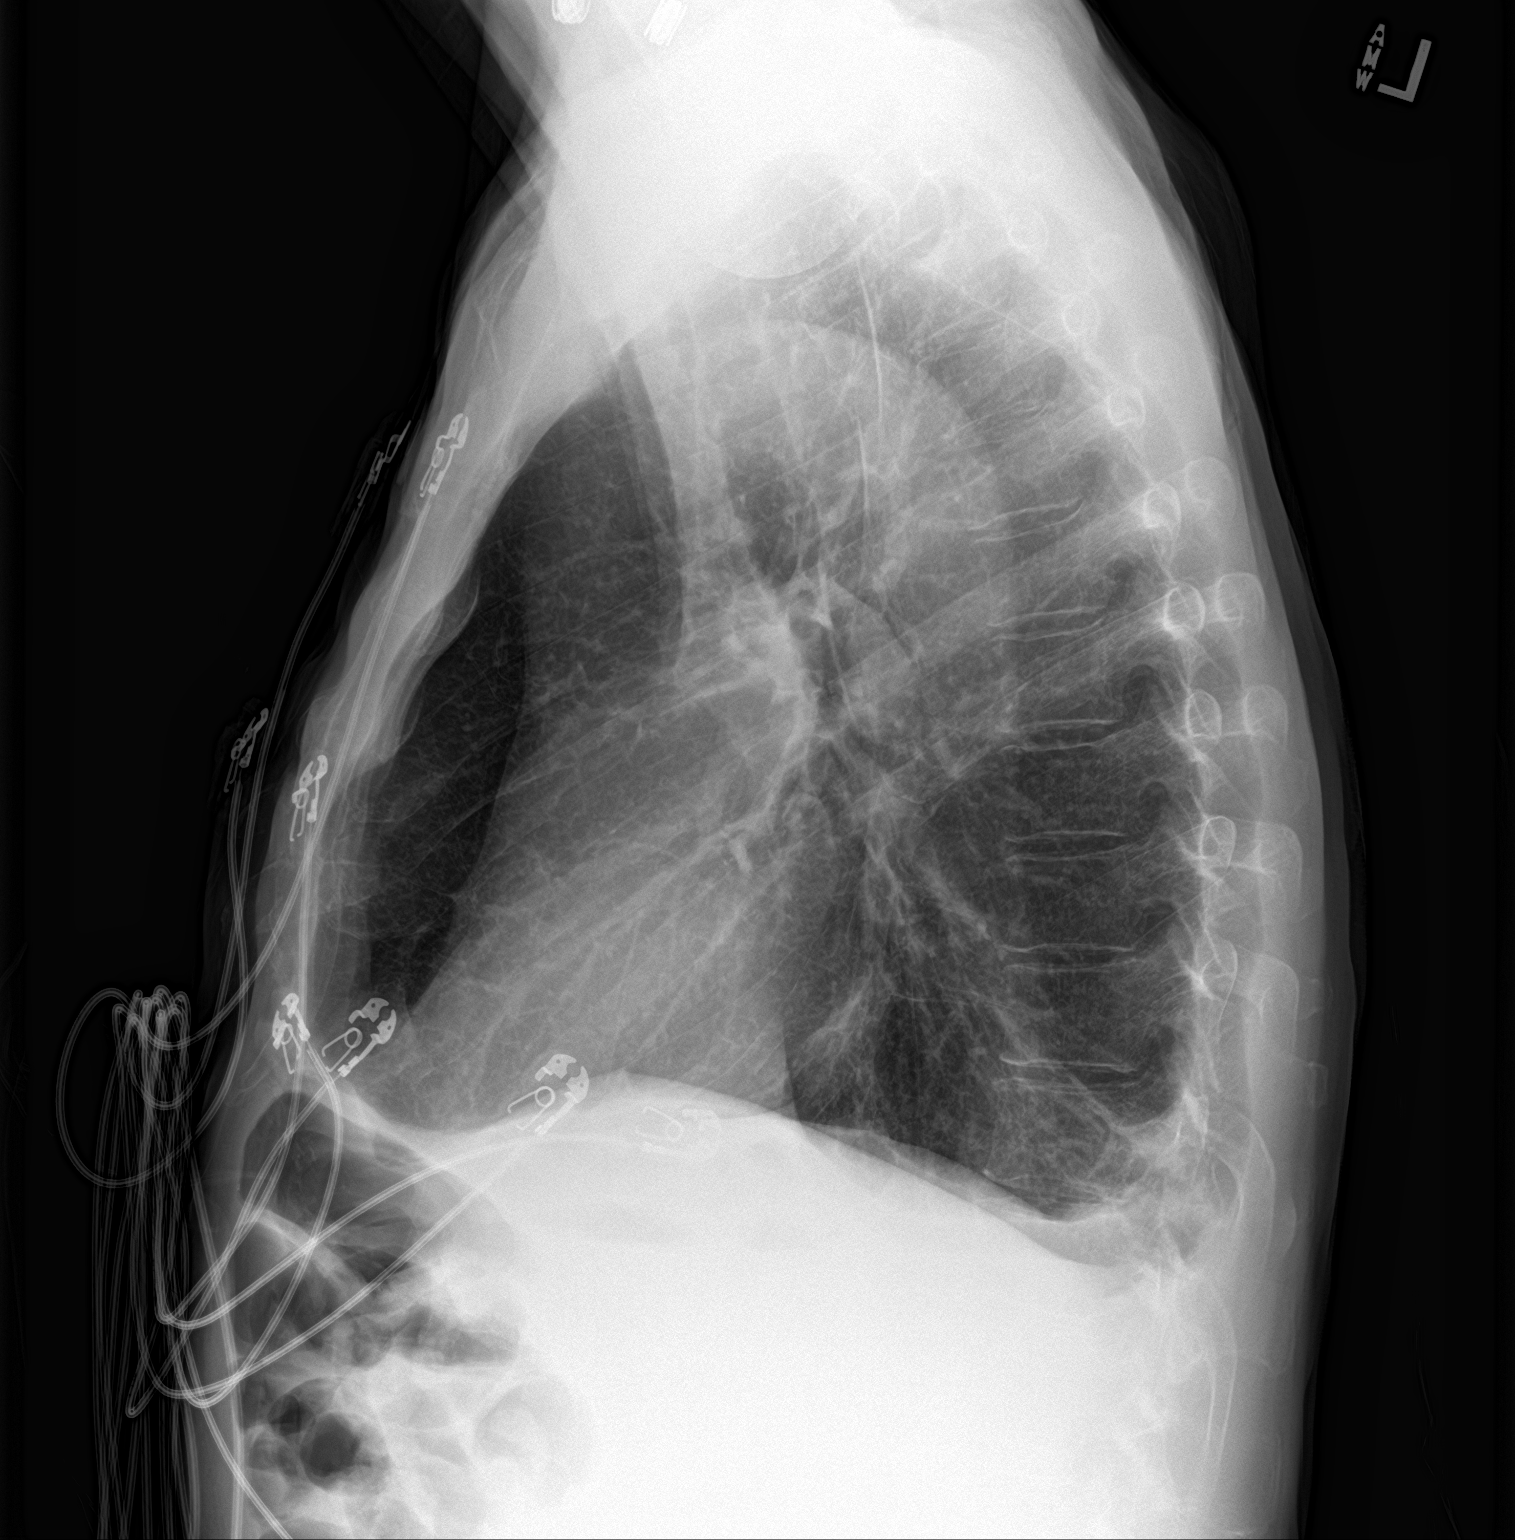

[2 of 2 positions shown; findings below may reference images not displayed]

FINDINGS: Cardiac shadows within normal limits. The lungs are well aerated
bilaterally. No focal infiltrate is seen. Mild blunting of the
costophrenic angles is noted. Bilateral nipple shadows are seen. The
nodular density seen in the left lung base is not as well
appreciated on today's exam.
IMPRESSION: Small bilateral pleural effusions.

The nodular density seen on the prior CT is not well appreciated on
today's exam. Follow-up is recommended as previously described.

## 2020-08-10 ENCOUNTER — Other Ambulatory Visit: Payer: Self-pay

## 2020-08-10 DIAGNOSIS — I82411 Acute embolism and thrombosis of right femoral vein: Secondary | ICD-10-CM

## 2020-08-28 ENCOUNTER — Encounter: Payer: Self-pay | Admitting: Vascular Surgery

## 2020-08-28 ENCOUNTER — Other Ambulatory Visit: Payer: Self-pay

## 2020-08-28 ENCOUNTER — Ambulatory Visit (HOSPITAL_COMMUNITY)
Admission: RE | Admit: 2020-08-28 | Discharge: 2020-08-28 | Disposition: A | Discharge status: Home / Self Care | Payer: BC Managed Care – PPO | Source: Ambulatory Visit | Attending: Vascular Surgery | Admitting: Vascular Surgery

## 2020-08-28 ENCOUNTER — Ambulatory Visit (INDEPENDENT_AMBULATORY_CARE_PROVIDER_SITE_OTHER): Payer: BC Managed Care – PPO | Admitting: Vascular Surgery

## 2020-08-28 VITALS — BP 136/92 | HR 47 | Temp 97.3°F | Resp 17 | Ht 69.0 in | Wt 137.0 lb

## 2020-08-28 DIAGNOSIS — I82401 Acute embolism and thrombosis of unspecified deep veins of right lower extremity: Secondary | ICD-10-CM

## 2020-08-28 DIAGNOSIS — I82411 Acute embolism and thrombosis of right femoral vein: Secondary | ICD-10-CM | POA: Diagnosis not present

## 2020-08-28 DIAGNOSIS — I82402 Acute embolism and thrombosis of unspecified deep veins of left lower extremity: Secondary | ICD-10-CM

## 2020-08-28 NOTE — Progress Notes (Signed)
Patient name: Thomas Huerta MRN: [id number] DOB: 05-Jul-1956 Sex: male  REASON FOR VISIT: Follow-up after recent right iliac vein intervention for right leg DVT  HPI: Thomas Huerta is a 64 y.o. male presents for follow-up after recent right iliac vein intervention for right leg DVT.  Ultimately on 07/25/2020 he underwent percutaneous mechanical thrombectomy for extensive right leg DVT with angioplasty and stent placement in the right common and external iliac vein with a Wallstent.  He also has undergone intervention in the left leg with thrombolysis as well as left common and external iliac angioplasty and stent placement with VICI stent in 2019.  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  He remains on Xarelto and understands this will be lifelong.  He feels his legs are 95% better.  He has no specific concerns today.  Past Medical History:  Diagnosis Date  . Back pain    chronic  . Complication of anesthesia    over medicated with Fentanyl  . History of kidney stones    5th time treating stones in 15 years  . Obesity    history of prior to gastric bypass  . Peripheral arterial disease (HCC)   . Peripheral vascular disease [facility])     Past Surgical History:  Procedure Laterality Date  . ANGIOPLASTY ILLIAC ARTERY Left 09/11/2018   Procedure: BALLOON ANGIOPLASTY LEFT ILIAC VEIN;  Surgeon: Cephus Shelling, MD;  Location: [facility] OR;  Service: Vascular;  Laterality: Left;  . CYST EXCISION     left lower back beside the spine  . CYSTOSCOPY    . CYSTOSCOPY W/ URETERAL STENT REMOVAL Left 08/18/2017   Procedure: CYSTOSCOPY WITH STENT REMOVAL;  Surgeon: Orson Ape, MD;  Location: [facility] ORS;  Service: Urology;  Laterality: Left;  . CYSTOSCOPY WITH STENT PLACEMENT Left 06/16/2017   Procedure: CYSTOSCOPY WITH STENT PLACEMENT;  Surgeon: Orson Ape, MD;  Location: [facility] ORS;  Service: Urology;  Laterality: Left;  . ESOPHAGOGASTRODUODENOSCOPY (EGD) WITH PROPOFOL N/A  12/14/2018   Procedure: ESOPHAGOGASTRODUODENOSCOPY (EGD) WITH PROPOFOL;  Surgeon: Toney Reil, MD;  Location: [facility] ENDOSCOPY;  Service: Gastroenterology;  Laterality: N/A;  . EXTRACORPOREAL SHOCK WAVE LITHOTRIPSY Left 06/25/2017   Procedure: EXTRACORPOREAL SHOCK WAVE LITHOTRIPSY (ESWL);  Surgeon: Orson Ape, MD;  Location: [facility] ORS;  Service: Urology;  Laterality: Left;  . EXTRACORPOREAL SHOCK WAVE LITHOTRIPSY Left 08/06/2017   Procedure: EXTRACORPOREAL SHOCK WAVE LITHOTRIPSY (ESWL);  Surgeon: Orson Ape, MD;  Location: [facility] ORS;  Service: Urology;  Laterality: Left;  . EXTRACORPOREAL SHOCK WAVE LITHOTRIPSY Right 11/05/2017   Procedure: EXTRACORPOREAL SHOCK WAVE LITHOTRIPSY (ESWL);  Surgeon: Orson Ape, MD;  Location: [facility] ORS;  Service: Urology;  Laterality: Right;  . GASTRIC BYPASS  2004  . HERNIA REPAIR Bilateral    inguinal hernias  . INSERTION OF ILIAC STENT Left 09/11/2018   Procedure: INSERTION OF LEFT ILIAC STENT;  Surgeon: Cephus Shelling, MD;  Location: [facility] OR;  Service: Vascular;  Laterality: Left;  . INTRAVASCULAR ULTRASOUND/IVUS N/A 09/11/2018   Procedure: INTRAVASCULAR ULTRASOUND/IVUS;  Surgeon: Cephus Shelling, MD;  Location: [facility] OR;  Service: Vascular;  Laterality: N/A;  . LOWER EXTREMITY VENOGRAPHY Left 09/10/2018   Procedure: LOWER EXTREMITY VENOGRAPHY;  Surgeon: Sherren Kerns, MD;  Location: [facility] INVASIVE CV LAB;  Service: Cardiovascular;  Laterality: Left;  . PERIPHERAL VASCULAR INTERVENTION  07/25/2020   Procedure: PERIPHERAL VASCULAR INTERVENTION;  Surgeon: Cephus Shelling, MD;  Location: [facility] INVASIVE CV LAB;  Service: Cardiovascular;;  lower rt external and common femoral venous stent  . PERIPHERAL VASCULAR THROMBECTOMY Left 09/10/2018   Procedure: PERIPHERAL VASCULAR THROMBECTOMY;  Surgeon: Sherren Kerns, MD;  Location: [facility] INVASIVE CV LAB;  Service: Cardiovascular;  Laterality: Left;  lower extremity venous system.  Marland Kitchen PERIPHERAL  VASCULAR THROMBECTOMY Right 07/25/2020   Procedure: PERIPHERAL VASCULAR THROMBECTOMY;  Surgeon: Cephus Shelling, MD;  Location: [facility] INVASIVE CV LAB;  Service: Cardiovascular;  Laterality: Right;  . TONSILLECTOMY     and addenoids  . TONSILLECTOMY AND ADENOIDECTOMY    . URETEROSCOPY WITH HOLMIUM LASER LITHOTRIPSY Left 06/16/2017   Procedure: URETEROSCOPY WITH HOLMIUM LASER LITHOTRIPSY;  Surgeon: Orson Ape, MD;  Location: [facility] ORS;  Service: Urology;  Laterality: Left;  Marland Kitchen VENOGRAM Left 09/11/2018   Procedure: VENOGRAM LEFT LEG;  Surgeon: Cephus Shelling, MD;  Location: [facility] OR;  Service: Vascular;  Laterality: Left;    Family History  Problem Relation Age of Onset  . Lymphoma Mother   . Colon cancer Father     SOCIAL HISTORY: Social History   Tobacco Use  . Smoking status: Former Smoker    Packs/day: 1.00    Types: Cigarettes    Quit date: 11/01/2018    Years since quitting: 1.8  . Smokeless tobacco: Never Used  Substance Use Topics  . Alcohol use: No    Allergies  Allergen Reactions  . Fentanyl Other (See Comments)    Sedated for days    Current Outpatient Medications  Medication Sig Dispense Refill  . baclofen (LIORESAL) 10 MG tablet Take 5-10 mg by mouth 3 (three) times daily as needed for muscle spasms.     . Buprenorphine HCl-Naloxone HCl 12-3 MG FILM Place 0.5 Film under the tongue in the morning, at noon, in the evening, and at bedtime.    Marland Kitchen CALCIUM PO Take 1 tablet by mouth daily.    . Cholecalciferol (VITAMIN D3 PO) Take 1 tablet by mouth daily.    . Ferrous Sulfate (IRON PO) Take 1 tablet by mouth daily.    . indomethacin (INDOCIN) 25 MG capsule Take 25 mg by mouth 3 (three) times daily as needed (gout flares).     . Multiple Vitamins-Minerals (MULTIVITAMIN WITH MINERALS) tablet Take 1 tablet by mouth daily.    Marland Kitchen RIVAROXABAN (XARELTO) VTE STARTER PACK (15 & 20 MG TABLETS) Follow package directions: Take one 15mg  tablet by mouth twice a day. On day 22,  switch to one 20mg  tablet once a day. Take with food. 51 each 0  . vitamin B-12 (CYANOCOBALAMIN) 500 MCG tablet Take 1 tablet (500 mcg total) by mouth daily. 90 tablet 3  . enoxaparin (LOVENOX) 40 MG/0.4ML injection Inject 0.2 mLs (20 mg total) into the skin every 12 (twelve) hours. (Patient not taking: Reported on 08/28/2020) 0.6 mL 0   No current facility-administered medications for this visit.    REVIEW OF SYSTEMS:  [X]  denotes positive finding, [ ]  denotes negative finding Cardiac  Comments:  Chest pain or chest pressure:    Shortness of breath upon exertion:    Short of breath when lying flat:    Irregular heart rhythm:        Vascular    Pain in calf, thigh, or hip brought on by ambulation:    Pain in feet at night that wakes you up from your sleep:     Blood clot in your veins:    Leg swelling:         Pulmonary    Oxygen  at home:    Productive cough:     Wheezing:         Neurologic    Sudden weakness in arms or legs:     Sudden numbness in arms or legs:     Sudden onset of difficulty speaking or slurred speech:    Temporary loss of vision in one eye:     Problems with dizziness:         Gastrointestinal    Blood in stool:     Vomited blood:         Genitourinary    Burning when urinating:     Blood in urine:        Psychiatric    Major depression:         Hematologic    Bleeding problems:    Problems with blood clotting too easily:        Skin    Rashes or ulcers:        Constitutional    Fever or chills:      PHYSICAL EXAM: Vitals:   08/28/20 0854  BP: (!) 136/92  Pulse: (!) 47  Resp: 17  Temp: (!) 97.3 F (36.3 C)  TempSrc: Temporal  SpO2: 97%  Weight: 137 lb (62.1 kg)  Height: 5\' 9"  (1.753 m)    GENERAL: The patient is a well-nourished male, in no acute distress. The vital signs are documented above. CARDIAC: There is a regular rate and rhythm.  VASCULAR:  No significant edema bilateral lower extremities  DATA:   Bilateral iliac  vein duplex today shows patent bilateral iliac vein stents.  Assessment/Plan:  64 year old male that presents for follow-up after recent right leg percutaneous mechanical thrombectomy with angioplasty and stenting of the right common and external iliac vein.  He has also undergone left leg thrombolysis and stenting in 2019 with a venous stent as well.  Ultimately stents are widely patent on duplex today.  He understands that Xarelto will be lifelong given history of recurrent DVTs and apparently was recently diagnosed with factor V.  Will arrange follow-up in 6 months for iliac vein duplex and ongoing surveillance.  Discussed the importance of wearing compression.  Very pleased with his progress and symptoms relief.  He knows to call with questions or concerns.   2020, MD [facility] [place] Office: [phone]

## 2020-09-03 ENCOUNTER — Other Ambulatory Visit: Payer: Self-pay | Admitting: *Deleted

## 2020-09-03 DIAGNOSIS — M79606 Pain in leg, unspecified: Secondary | ICD-10-CM

## 2020-09-03 DIAGNOSIS — I871 Compression of vein: Secondary | ICD-10-CM

## 2020-09-03 DIAGNOSIS — M7989 Other specified soft tissue disorders: Secondary | ICD-10-CM

## 2020-10-02 ENCOUNTER — Telehealth: Payer: Self-pay

## 2020-10-02 ENCOUNTER — Ambulatory Visit (HOSPITAL_COMMUNITY)
Admission: RE | Admit: 2020-10-02 | Discharge: 2020-10-02 | Disposition: A | Discharge status: Home / Self Care | Payer: BC Managed Care – PPO | Source: Ambulatory Visit | Attending: Physician Assistant | Admitting: Physician Assistant

## 2020-10-02 ENCOUNTER — Other Ambulatory Visit (HOSPITAL_COMMUNITY): Payer: Self-pay | Admitting: Vascular Surgery

## 2020-10-02 ENCOUNTER — Other Ambulatory Visit: Payer: Self-pay

## 2020-10-02 ENCOUNTER — Ambulatory Visit (INDEPENDENT_AMBULATORY_CARE_PROVIDER_SITE_OTHER): Payer: BC Managed Care – PPO | Admitting: Physician Assistant

## 2020-10-02 VITALS — BP 131/90 | HR 57 | Temp 98.3°F | Resp 20 | Ht 69.0 in | Wt 149.2 lb

## 2020-10-02 DIAGNOSIS — R600 Localized edema: Secondary | ICD-10-CM

## 2020-10-02 DIAGNOSIS — M79604 Pain in right leg: Secondary | ICD-10-CM | POA: Diagnosis not present

## 2020-10-02 DIAGNOSIS — D6851 Activated protein C resistance: Secondary | ICD-10-CM

## 2020-10-02 DIAGNOSIS — M79605 Pain in left leg: Secondary | ICD-10-CM

## 2020-10-02 DIAGNOSIS — I82402 Acute embolism and thrombosis of unspecified deep veins of left lower extremity: Secondary | ICD-10-CM

## 2020-10-02 NOTE — Progress Notes (Signed)
Office Note     CC:  follow up Requesting Provider:  Dema Severin, NP  HPI: Thomas Huerta is a 64 y.o. (Jan 11, 1956) male who presents with complaints of worsening lower extremity edema over the last several weeks.  He admits to not being as regular wearing his compression stockings and elevating his legs above heart level on a daily basis.  He denies lower extremity pain, chest pain or shortness of breath.  He is compliant with his Xarelto daily.  His history significant for bilateral lower extremity swelling first evaluated by Dr. Darrick Penna in October 2019.  The patient that time had an acute left lower extremity DVT and underwent left lower extremity venogram, thrombolysis and V ICI left common iliac vein and external iliac vein stenting.  He was placed on Eliquis.  He was followed longitudinally in the office.  In June 2020 he remained on his Xarelto had several medical issues at that time.  Dr. Darrick Penna noted he was not an ablation candidate.  He returned to the office in September 2021 with reports of minor blunt right lower extremity trauma with resultant swelling.  He was diagnosed with acute popliteal and femoral deep venous thrombosis.  The patient had stopped taking his Xarelto because of feeling bad while taking the medication.  Because of the COVID-19 pandemic, the patient was treated on an outpatient basis with Lovenox.  He underwent mechanical popliteal and femoral vein thrombectomy with angioplasty and Wallstent placement of the right common iliac vein and external iliac vein.  During this process, the patient was diagnosed with factor V Leiden mutation.  Past Medical History:  Diagnosis Date  . Back pain    chronic  . Complication of anesthesia    over medicated with Fentanyl  . History of kidney stones    5th time treating stones in 15 years  . Obesity    history of prior to gastric bypass  . Peripheral arterial disease (HCC)   . Peripheral vascular disease [facility])     Past  Surgical History:  Procedure Laterality Date  . ANGIOPLASTY ILLIAC ARTERY Left 09/11/2018   Procedure: BALLOON ANGIOPLASTY LEFT ILIAC VEIN;  Surgeon: Cephus Shelling, MD;  Location: [facility] OR;  Service: Vascular;  Laterality: Left;  . CYST EXCISION     left lower back beside the spine  . CYSTOSCOPY    . CYSTOSCOPY W/ URETERAL STENT REMOVAL Left 08/18/2017   Procedure: CYSTOSCOPY WITH STENT REMOVAL;  Surgeon: Orson Ape, MD;  Location: [facility] ORS;  Service: Urology;  Laterality: Left;  . CYSTOSCOPY WITH STENT PLACEMENT Left 06/16/2017   Procedure: CYSTOSCOPY WITH STENT PLACEMENT;  Surgeon: Orson Ape, MD;  Location: [facility] ORS;  Service: Urology;  Laterality: Left;  . ESOPHAGOGASTRODUODENOSCOPY (EGD) WITH PROPOFOL N/A 12/14/2018   Procedure: ESOPHAGOGASTRODUODENOSCOPY (EGD) WITH PROPOFOL;  Surgeon: Toney Reil, MD;  Location: [facility] ENDOSCOPY;  Service: Gastroenterology;  Laterality: N/A;  . EXTRACORPOREAL SHOCK WAVE LITHOTRIPSY Left 06/25/2017   Procedure: EXTRACORPOREAL SHOCK WAVE LITHOTRIPSY (ESWL);  Surgeon: Orson Ape, MD;  Location: [facility] ORS;  Service: Urology;  Laterality: Left;  . EXTRACORPOREAL SHOCK WAVE LITHOTRIPSY Left 08/06/2017   Procedure: EXTRACORPOREAL SHOCK WAVE LITHOTRIPSY (ESWL);  Surgeon: Orson Ape, MD;  Location: [facility] ORS;  Service: Urology;  Laterality: Left;  . EXTRACORPOREAL SHOCK WAVE LITHOTRIPSY Right 11/05/2017   Procedure: EXTRACORPOREAL SHOCK WAVE LITHOTRIPSY (ESWL);  Surgeon: Orson Ape, MD;  Location: [facility] ORS;  Service: Urology;  Laterality: Right;  . GASTRIC BYPASS  2004  . HERNIA REPAIR Bilateral    inguinal hernias  . INSERTION OF ILIAC STENT Left 09/11/2018   Procedure: INSERTION OF LEFT ILIAC STENT;  Surgeon: Cephus Shelling, MD;  Location: [facility] OR;  Service: Vascular;  Laterality: Left;  . INTRAVASCULAR ULTRASOUND/IVUS N/A 09/11/2018   Procedure: INTRAVASCULAR ULTRASOUND/IVUS;  Surgeon: Cephus Shelling, MD;  Location:  [facility] OR;  Service: Vascular;  Laterality: N/A;  . LOWER EXTREMITY VENOGRAPHY Left 09/10/2018   Procedure: LOWER EXTREMITY VENOGRAPHY;  Surgeon: Sherren Kerns, MD;  Location: [facility] INVASIVE CV LAB;  Service: Cardiovascular;  Laterality: Left;  . PERIPHERAL VASCULAR INTERVENTION  07/25/2020   Procedure: PERIPHERAL VASCULAR INTERVENTION;  Surgeon: Cephus Shelling, MD;  Location: [facility] INVASIVE CV LAB;  Service: Cardiovascular;;  lower rt external and common femoral venous stent  . PERIPHERAL VASCULAR THROMBECTOMY Left 09/10/2018   Procedure: PERIPHERAL VASCULAR THROMBECTOMY;  Surgeon: Sherren Kerns, MD;  Location: [facility] INVASIVE CV LAB;  Service: Cardiovascular;  Laterality: Left;  lower extremity venous system.  Marland Kitchen PERIPHERAL VASCULAR THROMBECTOMY Right 07/25/2020   Procedure: PERIPHERAL VASCULAR THROMBECTOMY;  Surgeon: Cephus Shelling, MD;  Location: [facility] INVASIVE CV LAB;  Service: Cardiovascular;  Laterality: Right;  . TONSILLECTOMY     and addenoids  . TONSILLECTOMY AND ADENOIDECTOMY    . URETEROSCOPY WITH HOLMIUM LASER LITHOTRIPSY Left 06/16/2017   Procedure: URETEROSCOPY WITH HOLMIUM LASER LITHOTRIPSY;  Surgeon: Orson Ape, MD;  Location: [facility] ORS;  Service: Urology;  Laterality: Left;  Marland Kitchen VENOGRAM Left 09/11/2018   Procedure: VENOGRAM LEFT LEG;  Surgeon: Cephus Shelling, MD;  Location: [facility] OR;  Service: Vascular;  Laterality: Left;    Social History   Socioeconomic History  . Marital status: Married    Spouse name: Not on file  . Number of children: Not on file  . Years of education: Not on file  . Highest education level: Not on file  Occupational History  . Not on file  Tobacco Use  . Smoking status: Former Smoker    Packs/day: 1.00    Types: Cigarettes    Quit date: 11/01/2018    Years since quitting: 1.9  . Smokeless tobacco: Never Used  Vaping Use  . Vaping Use: Never used  Substance and Sexual Activity  . Alcohol use: No  . Drug use: Yes    Types: Oxycodone      Comment: past history age 5 with perscription Percocet  . Sexual activity: Not on file  Other Topics Concern  . Not on file  Social History Narrative  . Not on file   Social Determinants of Health   Financial Resource Strain:   . Difficulty of Paying Living Expenses: Not on file  Food Insecurity:   . Worried About Programme researcher, broadcasting/film/video in the Last Year: Not on file  . Ran Out of Food in the Last Year: Not on file  Transportation Needs:   . Lack of Transportation (Medical): Not on file  . Lack of Transportation (Non-Medical): Not on file  Physical Activity:   . Days of Exercise per Week: Not on file  . Minutes of Exercise per Session: Not on file  Stress:   . Feeling of Stress : Not on file  Social Connections:   . Frequency of Communication with Friends and Family: Not on file  . Frequency of Social Gatherings with Friends and Family: Not on file  . Attends Religious Services: Not on file  . Active Member of Clubs or Organizations: Not on  file  . Attends Banker Meetings: Not on file  . Marital Status: Not on file  Intimate Partner Violence:   . Fear of Current or Ex-Partner: Not on file  . Emotionally Abused: Not on file  . Physically Abused: Not on file  . Sexually Abused: Not on file   Family History  Problem Relation Age of Onset  . Lymphoma Mother   . Colon cancer Father     Current Outpatient Medications  Medication Sig Dispense Refill  . baclofen (LIORESAL) 10 MG tablet Take 5-10 mg by mouth 3 (three) times daily as needed for muscle spasms.     . Buprenorphine HCl-Naloxone HCl 12-3 MG FILM Place 0.5 Film under the tongue in the morning, at noon, in the evening, and at bedtime.    Marland Kitchen CALCIUM PO Take 1 tablet by mouth daily.    . Cholecalciferol (VITAMIN D3 PO) Take 1 tablet by mouth daily.    Marland Kitchen enoxaparin (LOVENOX) 40 MG/0.4ML injection Inject 0.2 mLs (20 mg total) into the skin every 12 (twelve) hours. (Patient not taking: Reported on 08/28/2020)  0.6 mL 0  . Ferrous Sulfate (IRON PO) Take 1 tablet by mouth daily.    . indomethacin (INDOCIN) 25 MG capsule Take 25 mg by mouth 3 (three) times daily as needed (gout flares).     . Multiple Vitamins-Minerals (MULTIVITAMIN WITH MINERALS) tablet Take 1 tablet by mouth daily.    Marland Kitchen RIVAROXABAN (XARELTO) VTE STARTER PACK (15 & 20 MG TABLETS) Follow package directions: Take one 15mg  tablet by mouth twice a day. On day 22, switch to one 20mg  tablet once a day. Take with food. 51 each 0  . vitamin B-12 (CYANOCOBALAMIN) 500 MCG tablet Take 1 tablet (500 mcg total) by mouth daily. 90 tablet 3   No current facility-administered medications for this visit.    Allergies  Allergen Reactions  . Fentanyl Other (See Comments)    Sedated for days     REVIEW OF SYSTEMS:   [X]  denotes positive finding, [ ]  denotes negative finding Cardiac  Comments:  Chest pain or chest pressure:    Shortness of breath upon exertion:    Short of breath when lying flat:    Irregular heart rhythm:        Vascular    Pain in calf, thigh, or hip brought on by ambulation:    Pain in feet at night that wakes you up from your sleep:     Blood clot in your veins:    Leg swelling:  x       Pulmonary    Oxygen at home:    Productive cough:     Wheezing:         Neurologic    Sudden weakness in arms or legs:     Sudden numbness in arms or legs:     Sudden onset of difficulty speaking or slurred speech:    Temporary loss of vision in one eye:     Problems with dizziness:         Gastrointestinal    Blood in stool:     Vomited blood:         Genitourinary    Burning when urinating:     Blood in urine:        Psychiatric    Major depression:         Hematologic    Bleeding problems:    Problems with blood clotting too easily:  Skin    Rashes or ulcers:        Constitutional    Fever or chills:      PHYSICAL EXAMINATION:  Vitals:   10/02/20 1527  BP: 131/90  Pulse: (!) 57  Resp: 20    Temp: 98.3 F (36.8 C)  SpO2: 97%   General:  WDWN in NAD; vital signs documented above Gait: Unaided, no ataxia HENT: WNL, normocephalic Pulmonary: normal non-labored breathing ,  Cardiac: regular HR, regular rhythm Skin: without rashes; skin of both lower legs is dry and several areas of excoriation Vascular Exam/Pulses: 2+ right DP pulse; 1+ left AT pulse Extremities: without ischemic changes, without Gangrene , without cellulitis; without open wounds; 2+ pitting edema to thigh level.  Right calf is somewhat tight but pliable.  No pain on passive or active range of motion. Musculoskeletal: no muscle wasting or atrophy  Neurologic: A&O X 3;  No focal weakness or paresthesias are detected Psychiatric:  The pt has Normal affect.   Non-Invasive Vascular Imaging:   RIGHT:  - Findings consistent with age indeterminate deep vein thrombosis  involving the right common femoral vein, SF junction, right femoral vein,  right proximal profunda vein, and right popliteal vein. Patent EIV stent.  - No cystic structure found in the popliteal fossa.    LEFT:  - There is no evidence of deep vein thrombosis in the lower extremity.  Patent EIV stent.  - No cystic structure found in the popliteal fossa.     ASSESSMENT/PLAN:: 64 y.o. male with history of factor V Leiden mutation and past history of right lower extremity and left lower extremity DVT.  Most recently treated mechanical thrombectomy for right lower extremity DVT with angioplasty and Wallstent placement to right common iliac vein and right external iliac vein.  Discussed findings of bilateral lower extremity venous duplex today. No evidence of phlegmasia.  Detailed discussion regarding need for compliance with Xarelto as he is doing but need for surgical grade thigh-high compression stockings donned each morning prior to getting out of bed.  Described elevating legs above heart level several times daily.  I have given the patient written  information in this regard and he is interested in possible purchasing wedge for elevation.  He has a follow-up appointment arranged in approximately 5 months.   Milinda AntisSandra J Michole Lecuyer, PA-C [facility] [phone]  Clinic MD:   Lenell AntuHawken

## 2020-10-02 NOTE — Telephone Encounter (Signed)
Pt c/o bilateral leg and scrotum swelling x 5 days. Denies pain. DVT u/s and appt with APP have been made for today-pt verbalized understanding.

## 2020-11-26 ENCOUNTER — Encounter: Payer: Self-pay | Admitting: Emergency Medicine

## 2020-11-26 ENCOUNTER — Emergency Department: Payer: BC Managed Care – PPO

## 2020-11-26 ENCOUNTER — Other Ambulatory Visit: Payer: Self-pay

## 2020-11-26 ENCOUNTER — Emergency Department
Admission: EM | Admit: 2020-11-26 | Discharge: 2020-11-26 | Disposition: A | Discharge status: Home / Self Care | Payer: BC Managed Care – PPO | Attending: Emergency Medicine | Admitting: Emergency Medicine

## 2020-11-26 DIAGNOSIS — X58XXXA Exposure to other specified factors, initial encounter: Secondary | ICD-10-CM | POA: Insufficient documentation

## 2020-11-26 DIAGNOSIS — Z87891 Personal history of nicotine dependence: Secondary | ICD-10-CM | POA: Diagnosis not present

## 2020-11-26 DIAGNOSIS — S39012A Strain of muscle, fascia and tendon of lower back, initial encounter: Secondary | ICD-10-CM

## 2020-11-26 DIAGNOSIS — S34109A Unspecified injury to unspecified level of lumbar spinal cord, initial encounter: Secondary | ICD-10-CM | POA: Diagnosis present

## 2020-11-26 DIAGNOSIS — Z7901 Long term (current) use of anticoagulants: Secondary | ICD-10-CM | POA: Insufficient documentation

## 2020-11-26 MED ORDER — HYDROMORPHONE HCL 1 MG/ML IJ SOLN
1.0000 mg | Freq: Once | INTRAMUSCULAR | Status: AC
  Administered 2020-11-26: 1 mg via INTRAMUSCULAR
  Filled 2020-11-26: qty 1

## 2020-11-26 MED ORDER — ORPHENADRINE CITRATE ER 100 MG PO TB12: 100.0000 mg | ORAL_TABLET | Freq: Two times a day (BID) | ORAL | 0 refills | Status: DC

## 2020-11-26 MED ORDER — LIDOCAINE 5 % EX PTCH: 1.0000 | MEDICATED_PATCH | Freq: Two times a day (BID) | CUTANEOUS | 0 refills | Status: AC

## 2020-11-26 MED ORDER — ORPHENADRINE CITRATE 30 MG/ML IJ SOLN
60.0000 mg | Freq: Two times a day (BID) | INTRAMUSCULAR | Status: DC
  Administered 2020-11-26: 60 mg via INTRAMUSCULAR
  Filled 2020-11-26: qty 2

## 2020-11-26 NOTE — Discharge Instructions (Addendum)
Follow discharge care instruction take medication as directed. °

## 2020-11-26 NOTE — ED Provider Notes (Signed)
[facility] Emergency Department Provider Note   ____________________________________________   Event Date/Time   First MD Initiated Contact with Patient 11/26/20 1356     (approximate)  I have reviewed the triage vital signs and the nursing notes.   HISTORY  Chief Complaint Back Pain    HPI Thomas Huerta is a 65 y.o. male patient complain of bilateral back pain for 3 days.  Incident occurred after patient was still in a shoveling incident 4 days ago.  Patient denies radicular component to the back pain.  Patient has bladder bowel dysfunction.  Patient rates pain as 8/10.  Patient described pain as "spasmatic".  History of chronic back pain.  No recent imaging.  No palliative measures for complaint.  History of DVT patient currently taking Lovenox. Patient currently takes about Suboxone.     Past Medical History:  Diagnosis Date  . Back pain    chronic  . Complication of anesthesia    over medicated with Fentanyl  . History of kidney stones    5th time treating stones in 15 years  . Obesity    history of prior to gastric bypass  . Peripheral arterial disease (HCC)   . Peripheral vascular disease [facility])     Patient Active Problem List   Diagnosis Date Noted  . Right leg DVT (HCC) 08/28/2020  . Factor 5 Leiden mutation, heterozygous (HCC) 05/26/2019  . Gastric bypass status for obesity   . Moderate protein-calorie malnutrition (HCC)   . Elevated alkaline phosphatase level 12/09/2018  . Personal history of venous thrombosis and embolism 11/24/2018  . Macrocytosis 11/24/2018  . Hypoalbuminemia due to protein-calorie malnutrition (HCC) 11/24/2018  . Leg DVT (deep venous thromboembolism), acute, left (HCC) 09/10/2018  . Ureteral stone 02/12/2014    Past Surgical History:  Procedure Laterality Date  . ANGIOPLASTY ILLIAC ARTERY Left 09/11/2018   Procedure: BALLOON ANGIOPLASTY LEFT ILIAC VEIN;  Surgeon: Cephus Shelling, MD;  Location:  [facility] OR;  Service: Vascular;  Laterality: Left;  . CYST EXCISION     left lower back beside the spine  . CYSTOSCOPY    . CYSTOSCOPY W/ URETERAL STENT REMOVAL Left 08/18/2017   Procedure: CYSTOSCOPY WITH STENT REMOVAL;  Surgeon: Orson Ape, MD;  Location: [facility] ORS;  Service: Urology;  Laterality: Left;  . CYSTOSCOPY WITH STENT PLACEMENT Left 06/16/2017   Procedure: CYSTOSCOPY WITH STENT PLACEMENT;  Surgeon: Orson Ape, MD;  Location: [facility] ORS;  Service: Urology;  Laterality: Left;  . ESOPHAGOGASTRODUODENOSCOPY (EGD) WITH PROPOFOL N/A 12/14/2018   Procedure: ESOPHAGOGASTRODUODENOSCOPY (EGD) WITH PROPOFOL;  Surgeon: Toney Reil, MD;  Location: [facility] ENDOSCOPY;  Service: Gastroenterology;  Laterality: N/A;  . EXTRACORPOREAL SHOCK WAVE LITHOTRIPSY Left 06/25/2017   Procedure: EXTRACORPOREAL SHOCK WAVE LITHOTRIPSY (ESWL);  Surgeon: Orson Ape, MD;  Location: [facility] ORS;  Service: Urology;  Laterality: Left;  . EXTRACORPOREAL SHOCK WAVE LITHOTRIPSY Left 08/06/2017   Procedure: EXTRACORPOREAL SHOCK WAVE LITHOTRIPSY (ESWL);  Surgeon: Orson Ape, MD;  Location: [facility] ORS;  Service: Urology;  Laterality: Left;  . EXTRACORPOREAL SHOCK WAVE LITHOTRIPSY Right 11/05/2017   Procedure: EXTRACORPOREAL SHOCK WAVE LITHOTRIPSY (ESWL);  Surgeon: Orson Ape, MD;  Location: [facility] ORS;  Service: Urology;  Laterality: Right;  . GASTRIC BYPASS  2004  . HERNIA REPAIR Bilateral    inguinal hernias  . INSERTION OF ILIAC STENT Left 09/11/2018   Procedure: INSERTION OF LEFT ILIAC STENT;  Surgeon: Cephus Shelling, MD;  Location: [facility] OR;  Service: Vascular;  Laterality: Left;  . INTRAVASCULAR ULTRASOUND/IVUS N/A 09/11/2018   Procedure: INTRAVASCULAR ULTRASOUND/IVUS;  Surgeon: Cephus Shelling, MD;  Location: [facility] OR;  Service: Vascular;  Laterality: N/A;  . LOWER EXTREMITY VENOGRAPHY Left 09/10/2018   Procedure: LOWER EXTREMITY VENOGRAPHY;  Surgeon: Sherren Kerns, MD;  Location: [facility] INVASIVE  CV LAB;  Service: Cardiovascular;  Laterality: Left;  . PERIPHERAL VASCULAR INTERVENTION  07/25/2020   Procedure: PERIPHERAL VASCULAR INTERVENTION;  Surgeon: Cephus Shelling, MD;  Location: [facility] INVASIVE CV LAB;  Service: Cardiovascular;;  lower rt external and common femoral venous stent  . PERIPHERAL VASCULAR THROMBECTOMY Left 09/10/2018   Procedure: PERIPHERAL VASCULAR THROMBECTOMY;  Surgeon: Sherren Kerns, MD;  Location: [facility] INVASIVE CV LAB;  Service: Cardiovascular;  Laterality: Left;  lower extremity venous system.  Marland Kitchen PERIPHERAL VASCULAR THROMBECTOMY Right 07/25/2020   Procedure: PERIPHERAL VASCULAR THROMBECTOMY;  Surgeon: Cephus Shelling, MD;  Location: [facility] INVASIVE CV LAB;  Service: Cardiovascular;  Laterality: Right;  . TONSILLECTOMY     and addenoids  . TONSILLECTOMY AND ADENOIDECTOMY    . URETEROSCOPY WITH HOLMIUM LASER LITHOTRIPSY Left 06/16/2017   Procedure: URETEROSCOPY WITH HOLMIUM LASER LITHOTRIPSY;  Surgeon: Orson Ape, MD;  Location: [facility] ORS;  Service: Urology;  Laterality: Left;  Marland Kitchen VENOGRAM Left 09/11/2018   Procedure: VENOGRAM LEFT LEG;  Surgeon: Cephus Shelling, MD;  Location: [facility] OR;  Service: Vascular;  Laterality: Left;    Prior to Admission medications   Medication Sig Start Date End Date Taking? Authorizing Provider  lidocaine (LIDODERM) 5 % Place 1 patch onto the skin every 12 (twelve) hours. Remove & Discard patch within 12 hours or as directed by MD 11/26/20 11/26/21 Yes Joni Reining, PA-C  orphenadrine (NORFLEX) 100 MG tablet Take 1 tablet (100 mg total) by mouth 2 (two) times daily. 11/26/20  Yes Joni Reining, PA-C  baclofen (LIORESAL) 10 MG tablet Take 5-10 mg by mouth 3 (three) times daily as needed for muscle spasms.  07/04/20   [provider]  Buprenorphine HCl-Naloxone HCl 12-3 MG FILM Place 0.5 Film under the tongue in the morning, at noon, in the evening, and at bedtime.    [provider]  CALCIUM PO Take 1 tablet by  mouth daily.    [provider]  Cholecalciferol (VITAMIN D3 PO) Take 1 tablet by mouth daily.    [provider]  colchicine 0.6 MG tablet Take 0.6 mg by mouth 2 (two) times daily. 09/14/20   [provider]  enoxaparin (LOVENOX) 40 MG/0.4ML injection Inject 0.2 mLs (20 mg total) into the skin every 12 (twelve) hours. Patient not taking: Reported on 08/28/2020 07/19/20   Sherren Kerns, MD  Ferrous Sulfate (IRON PO) Take 1 tablet by mouth daily.    [provider]  indomethacin (INDOCIN) 25 MG capsule Take 25 mg by mouth 3 (three) times daily as needed (gout flares).  01/30/20   [provider]  Multiple Vitamins-Minerals (MULTIVITAMIN WITH MINERALS) tablet Take 1 tablet by mouth daily.    [provider]  RIVAROXABAN Carlena Hurl) VTE STARTER PACK (15 & 20 MG TABLETS) Follow package directions: Take one 15mg  tablet by mouth twice a day. On day 22, switch to one 20mg  tablet once a day. Take with food. 07/19/20   , MD  vitamin B-12 (CYANOCOBALAMIN) 500 MCG tablet Take 1 tablet (500 mcg total) by mouth daily. 12/08/18   Sherren Kerns, MD    Allergies Fentanyl  Family History  Problem Relation Age  of Onset  . Lymphoma Mother   . Colon cancer Father     Social History Social History   Tobacco Use  . Smoking status: Former Smoker    Packs/day: 1.00    Types: Cigarettes    Quit date: 11/01/2018    Years since quitting: 2.0  . Smokeless tobacco: Never Used  Vaping Use  . Vaping Use: Never used  Substance Use Topics  . Alcohol use: No  . Drug use: Yes    Types: Oxycodone    Comment: past history age 82 with perscription Percocet    Review of Systems Constitutional: No fever/chills Eyes: No visual changes. ENT: No sore throat. Cardiovascular: Denies chest pain.  History of peripheral vascular disease. Respiratory: Denies shortness of breath. Gastrointestinal: No abdominal pain.  No nausea, no vomiting.  No diarrhea.  No  constipation. Genitourinary: Negative for dysuria. Musculoskeletal: Positive for back pain. Skin: Negative for rash. Neurological: Negative for headaches, focal weakness or numbness. Allergic/Immunilogical: Fentanyl ____________________________________________   PHYSICAL EXAM:  VITAL SIGNS: ED Triage Vitals  Enc Vitals Group     BP 11/26/20 1234 133/80     Pulse Rate 11/26/20 1234 66     Resp 11/26/20 1234 16     Temp 11/26/20 1234 98 F (36.7 C)     Temp Source 11/26/20 1234 Oral     SpO2 11/26/20 1234 98 %     Weight 11/26/20 1137 149 lb 4 oz (67.7 kg)     Height 11/26/20 1137 5\' 9"  (1.753 m)     Head Circumference --      Peak Flow --      Pain Score 11/26/20 1137 8     Pain Loc --      Pain Edu? --      Excl. in GC? --     Constitutional: Alert and oriented. Well appearing and in no acute distress. Neck:No cervical spine tenderness to palpation. Hematological/Lymphatic/Immunilogical: No cervical lymphadenopathy. Cardiovascular: Normal rate, regular rhythm. Grossly normal heart sounds.  Good peripheral circulation. Respiratory: Normal respiratory effort.  No retractions. Lungs CTAB. Gastrointestinal: Soft and nontender. No distention. No abdominal bruits. No CVA tenderness. Musculoskeletal: No lumbar spine deformity. Moderate guarding palpation L4-S1. Decreased range of motion all fields in the back complaint pain. Patient negative straight leg test in supine position. Neurologic:  Normal speech and language. No gross focal neurologic deficits are appreciated. No gait instability. Skin:  Skin is warm, dry and intact. No rash noted. Psychiatric: Mood and affect are normal. Speech and behavior are normal.  ____________________________________________   LABS (all labs ordered are listed, but only abnormal results are displayed)  Labs Reviewed - No data to  display ____________________________________________  EKG   ____________________________________________  RADIOLOGY I, 01/24/21, personally viewed and evaluated these images (plain radiographs) as part of my medical decision making, as well as reviewing the written report by the radiologist.  ED MD interpretation: No acute findings on x-ray lumbar spine. Patient is mild multilevel degenerative changes. Biiliac stents are appreciated. Official radiology report(s): DG Lumbar Spine 2-3 Views  Result Date: 11/26/2020 CLINICAL DATA:  Increasing lumbosacral back pain for 3 days. No radiation. EXAM: LUMBAR SPINE - 2-3 VIEW COMPARISON:  None. FINDINGS: Slight scoliotic curvature in the lower most lumbar spine. Alignment otherwise maintained. No listhesis. Vertebral body heights are normal. Multilevel endplate spurring with mild diffuse disc space narrowing. Facet hypertrophy at L5-S1. Calcifications project over both renal beds likely representing nephrolithiasis. No obvious ureteral stones. Bi-iliac stents  in place. IMPRESSION: 1. Mild multilevel degenerative disc disease. Facet hypertrophy at L5-S1. 2. Bilateral nephrolithiasis. No obvious ureteral stones. Electronically Signed   By: Narda RutherfordMelanie  Sanford M.D.   On: 11/26/2020 15:52    ____________________________________________   PROCEDURES  Procedure(s) performed (including Critical Care):  Procedures   ____________________________________________   INITIAL IMPRESSION / ASSESSMENT AND PLAN / ED COURSE  As part of my medical decision making, I reviewed the following data within the electronic MEDICAL RECORD NUMBER         Patient presents with 3 days increasing back pain secondary to a "shoveling" incident. Discussed x-ray findings with patient. Patient complaining physical exam consistent with lumbosacral strain. Patient given discharge care instructions. Patient given prescription for Norflex and Lidoderm patches. Patient advised  follow-up PCP.      ____________________________________________   FINAL CLINICAL IMPRESSION(S) / ED DIAGNOSES  Final diagnoses:  Strain of lumbar region, initial encounter     ED Discharge Orders         Ordered    orphenadrine (NORFLEX) 100 MG tablet  2 times daily        11/26/20 1608    lidocaine (LIDODERM) 5 %  Every 12 hours        11/26/20 1608          *Please note:  Thomas Huerta was evaluated in Emergency Department on 11/26/2020 for the symptoms described in the history of present illness. He was evaluated in the context of the global COVID-19 pandemic, which necessitated consideration that the patient might be at risk for infection with the SARS-CoV-2 virus that causes COVID-19. Institutional protocols and algorithms that pertain to the evaluation of patients at risk for COVID-19 are in a state of rapid change based on information released by regulatory bodies including the CDC and federal and state organizations. These policies and algorithms were followed during the patient's care in the ED.  Some ED evaluations and interventions may be delayed as a result of limited staffing during and the pandemic.*   Note:  This document was prepared using Dragon voice recognition software and may include unintentional dictation errors.    Joni ReiningSmith, Kenzo Ozment K, PA-C 11/26/20 1614    Dionne BucySiadecki, Sebastian, MD 11/27/20 [phone]

## 2020-11-26 NOTE — ED Triage Notes (Signed)
First Nurse Note:  Arrives via [facility].  C/O bilateral lower back pain.  Has history of the same, which is muscle spasms.  Usually takes flexeril, patient is out.  Per report, patient reports doing some shoveling Thursday.  Patient also takes Suboxone.

## 2021-01-02 ENCOUNTER — Other Ambulatory Visit: Payer: Self-pay

## 2021-01-02 ENCOUNTER — Telehealth: Payer: Self-pay

## 2021-01-02 MED ORDER — ENOXAPARIN SODIUM 80 MG/0.8ML ~~LOC~~ SOLN: 1.0000 mg/kg | Freq: Two times a day (BID) | SUBCUTANEOUS | 0 refills | Status: DC

## 2021-01-02 MED ORDER — ENOXAPARIN SODIUM 150 MG/ML ~~LOC~~ SOLN: 1.0000 mg/kg | Freq: Two times a day (BID) | SUBCUTANEOUS | Status: AC

## 2021-01-02 NOTE — Telephone Encounter (Signed)
Received clearance form for patient to have EGD - and stop taking xarelto and pletal for 5 days. Discussed with CEF - patient should be bridged. Called in Lovenox 1 mg/kg per day and left VM to inform patient.

## 2021-02-14 DIAGNOSIS — M5440 Lumbago with sciatica, unspecified side: Secondary | ICD-10-CM | POA: Insufficient documentation

## 2021-02-21 DIAGNOSIS — M47816 Spondylosis without myelopathy or radiculopathy, lumbar region: Secondary | ICD-10-CM | POA: Insufficient documentation

## 2021-02-26 ENCOUNTER — Other Ambulatory Visit: Payer: Self-pay

## 2021-02-26 ENCOUNTER — Ambulatory Visit (INDEPENDENT_AMBULATORY_CARE_PROVIDER_SITE_OTHER): Payer: BC Managed Care – PPO | Admitting: Physician Assistant

## 2021-02-26 ENCOUNTER — Ambulatory Visit (HOSPITAL_COMMUNITY)
Admission: RE | Admit: 2021-02-26 | Discharge: 2021-02-26 | Disposition: A | Discharge status: Home / Self Care | Payer: BC Managed Care – PPO | Source: Ambulatory Visit | Attending: Vascular Surgery | Admitting: Vascular Surgery

## 2021-02-26 VITALS — BP 124/78 | HR 55 | Temp 98.4°F | Resp 20 | Ht 69.0 in | Wt 149.5 lb

## 2021-02-26 DIAGNOSIS — D6851 Activated protein C resistance: Secondary | ICD-10-CM | POA: Diagnosis not present

## 2021-02-26 DIAGNOSIS — I871 Compression of vein: Secondary | ICD-10-CM

## 2021-02-26 DIAGNOSIS — M79606 Pain in leg, unspecified: Secondary | ICD-10-CM | POA: Diagnosis present

## 2021-02-26 DIAGNOSIS — M7989 Other specified soft tissue disorders: Secondary | ICD-10-CM

## 2021-02-26 NOTE — Progress Notes (Signed)
Office Note     CC:  follow up Requesting Provider:  Dema SeverinYork, Regina F, NP  HPI: Thomas Huerta is a 65 y.o. (04/09/1956) male who presents for routine follow-up of venous thrombosis and stenting of bilateral iliac veins.  He has a history of factor V Leiden mutation.  He is compliant with his Xarelto.  He denies lower extremity pain or swelling.  His main complaint is degenerative disc disease of his back with chronic pain that affects his ambulation.  He has a percutaneous procedure on his back scheduled for tomorrow.  His history significant for bilateral lower extremity swelling first evaluated by Dr. Darrick PennaFields in October 2019. At that time, he had an acute left lower extremity DVT and underwent left lower extremity venogram, thrombolysis and left common iliac vein and external iliac vein angioplasty and stenting.  He was placed on Eliquis.  He was followed longitudinally in the office. He was converted to Xarelto. In June 2020 he remained on his Xarelto had several medical issues at that time.  Dr. Darrick PennaFields noted he was not an ablation candidate.  He returned to the office in September 2021 with reports of minor blunt right lower extremity trauma with resultant swelling.  He was diagnosed with acute popliteal and femoral deep venous thrombosis.  The patient had stopped taking his Xarelto because of feeling bad while taking the medication.  Because of the COVID-19 pandemic, the patient was treated on an outpatient basis with Lovenox.  He underwent mechanical popliteal and femoral vein thrombectomy with angioplasty and Wallstent placement of the right common iliac vein and external iliac vein.  During this process, the patient was diagnosed with factor V Leiden mutation.  Medical history is also significant for morbid obesity and is status post bariatric surgery.  He has had issues with continuing weight loss and malnutrition and has plans in the near future for gastric bypass reversal.  Past Medical  History:  Diagnosis Date  . Back pain    chronic  . Complication of anesthesia    over medicated with Fentanyl  . History of kidney stones    5th time treating stones in 15 years  . Obesity    history of prior to gastric bypass  . Peripheral arterial disease (HCC)   . Peripheral vascular disease [facility](HCC)     Past Surgical History:  Procedure Laterality Date  . ANGIOPLASTY ILLIAC ARTERY Left 09/11/2018   Procedure: BALLOON ANGIOPLASTY LEFT ILIAC VEIN;  Surgeon: Cephus Shellinglark, Christopher J, MD;  [facility];  Service: Vascular;  Laterality: Left;  . CYST EXCISION     left lower back beside the spine  . CYSTOSCOPY    . CYSTOSCOPY W/ URETERAL STENT REMOVAL Left 08/18/2017   Procedure: CYSTOSCOPY WITH STENT REMOVAL;  Surgeon: Orson ApeWolff, Michael R, MD;  Location: [facility] ORS;  Service: Urology;  Laterality: Left;  . CYSTOSCOPY WITH STENT PLACEMENT Left 06/16/2017   Procedure: CYSTOSCOPY WITH STENT PLACEMENT;  Surgeon: Orson ApeWolff, Michael R, MD;  Location: [facility] ORS;  Service: Urology;  Laterality: Left;  . ESOPHAGOGASTRODUODENOSCOPY (EGD) WITH PROPOFOL N/A 12/14/2018   Procedure: ESOPHAGOGASTRODUODENOSCOPY (EGD) WITH PROPOFOL;  Surgeon: Toney ReilVanga, Rohini Reddy, MD;  Location[facility]COPY;  Service: Gastroenterology;  Laterality: N/A;  . EXTRACORPOREAL SHOCK WAVE LITHOTRIPSY Left 06/25/2017   Procedure: EXTRACORPOREAL SHOCK WAVE LITHOTRIPSY (ESWL);  Surgeon: Orson ApeWolff, Michael R, MD;  Location: [facility] ORS;  Service: Urology;  Laterality: Left;  . EXTRACORPOREAL SHOCK WAVE LITHOTRIPSY Left 08/06/2017   Procedure: EXTRACORPOREAL SHOCK WAVE LITHOTRIPSY (ESWL);  Surgeon: Orson Ape, MD;  Location: [facility] ORS;  Service: Urology;  Laterality: Left;  . EXTRACORPOREAL SHOCK WAVE LITHOTRIPSY Right 11/05/2017   Procedure: EXTRACORPOREAL SHOCK WAVE LITHOTRIPSY (ESWL);  Surgeon: Orson Ape, MD;  Location: [facility] ORS;  Service: Urology;  Laterality: Right;  . GASTRIC BYPASS  2004  . HERNIA REPAIR Bilateral    inguinal  hernias  . INSERTION OF ILIAC STENT Left 09/11/2018   Procedure: INSERTION OF LEFT ILIAC STENT;  Surgeon: Cephus Shelling, MD;  Location: [facility] OR;  Service: Vascular;  Laterality: Left;  . INTRAVASCULAR ULTRASOUND/IVUS N/A 09/11/2018   Procedure: INTRAVASCULAR ULTRASOUND/IVUS;  Surgeon: Cephus Shelling, MD;  Location: [facility] OR;  Service: Vascular;  Laterality: N/A;  . LOWER EXTREMITY VENOGRAPHY Left 09/10/2018   Procedure: LOWER EXTREMITY VENOGRAPHY;  Surgeon: Sherren Kerns, MD;  Location: [facility] INVASIVE CV LAB;  Service: Cardiovascular;  Laterality: Left;  . PERIPHERAL VASCULAR INTERVENTION  07/25/2020   Procedure: PERIPHERAL VASCULAR INTERVENTION;  Surgeon: Cephus Shelling, MD;  Location: [facility] INVASIVE CV LAB;  Service: Cardiovascular;;  lower rt external and common femoral venous stent  . PERIPHERAL VASCULAR THROMBECTOMY Left 09/10/2018   Procedure: PERIPHERAL VASCULAR THROMBECTOMY;  Surgeon: Sherren Kerns, MD;  Location: [facility] INVASIVE CV LAB;  Service: Cardiovascular;  Laterality: Left;  lower extremity venous system.  Marland Kitchen PERIPHERAL VASCULAR THROMBECTOMY Right 07/25/2020   Procedure: PERIPHERAL VASCULAR THROMBECTOMY;  Surgeon: Cephus Shelling, MD;  Location: [facility] INVASIVE CV LAB;  Service: Cardiovascular;  Laterality: Right;  . TONSILLECTOMY     and addenoids  . TONSILLECTOMY AND ADENOIDECTOMY    . URETEROSCOPY WITH HOLMIUM LASER LITHOTRIPSY Left 06/16/2017   Procedure: URETEROSCOPY WITH HOLMIUM LASER LITHOTRIPSY;  Surgeon: Orson Ape, MD;  Location: [facility] ORS;  Service: Urology;  Laterality: Left;  Marland Kitchen VENOGRAM Left 09/11/2018   Procedure: VENOGRAM LEFT LEG;  Surgeon: Cephus Shelling, MD;  Location: [facility] OR;  Service: Vascular;  Laterality: Left;    Social History   Socioeconomic History  . Marital status: Married    Spouse name: Not on file  . Number of children: Not on file  . Years of education: Not on file  . Highest education level: Not on file  Occupational  History  . Not on file  Tobacco Use  . Smoking status: Former Smoker    Packs/day: 1.00    Types: Cigarettes    Quit date: 11/01/2018    Years since quitting: 2.3  . Smokeless tobacco: Never Used  Vaping Use  . Vaping Use: Never used  Substance and Sexual Activity  . Alcohol use: No  . Drug use: Yes    Types: Oxycodone    Comment: past history age 31 with perscription Percocet  . Sexual activity: Not on file  Other Topics Concern  . Not on file  Social History Narrative  . Not on file   Social Determinants of Health   Financial Resource Strain: Not on file  Food Insecurity: Not on file  Transportation Needs: Not on file  Physical Activity: Not on file  Stress: Not on file  Social Connections: Not on file  Intimate Partner Violence: Not on file   Family History  Problem Relation Age of Onset  . Lymphoma Mother   . Colon cancer Father     Current Outpatient Medications  Medication Sig Dispense Refill  . baclofen (LIORESAL) 10 MG tablet Take 5-10 mg by mouth 3 (three) times daily as needed for muscle spasms.     Marland Kitchen  Buprenorphine HCl-Naloxone HCl 12-3 MG FILM Place 0.5 Film under the tongue in the morning, at noon, in the evening, and at bedtime.    Marland Kitchen CALCIUM PO Take 1 tablet by mouth daily.    . Cholecalciferol (VITAMIN D3 PO) Take 1 tablet by mouth daily.    . colchicine 0.6 MG tablet Take 0.6 mg by mouth 2 (two) times daily.    Marland Kitchen enoxaparin (LOVENOX) 40 MG/0.4ML injection Inject 0.2 mLs (20 mg total) into the skin every 12 (twelve) hours. 0.6 mL 0  . Ferrous Sulfate (IRON PO) Take 1 tablet by mouth daily.    . indomethacin (INDOCIN) 25 MG capsule Take 25 mg by mouth 3 (three) times daily as needed (gout flares).     Marland Kitchen lidocaine (LIDODERM) 5 % Place 1 patch onto the skin every 12 (twelve) hours. Remove & Discard patch within 12 hours or as directed by MD 10 patch 0  . Multiple Vitamins-Minerals (MULTIVITAMIN WITH MINERALS) tablet Take 1 tablet by mouth daily.    .  orphenadrine (NORFLEX) 100 MG tablet Take 1 tablet (100 mg total) by mouth 2 (two) times daily. 10 tablet 0  . RIVAROXABAN (XARELTO) VTE STARTER PACK (15 & 20 MG TABLETS) Follow package directions: Take one 15mg  tablet by mouth twice a day. On day 22, switch to one 20mg  tablet once a day. Take with food. 51 each 0  . vitamin B-12 (CYANOCOBALAMIN) 500 MCG tablet Take 1 tablet (500 mcg total) by mouth daily. 90 tablet 3   No current facility-administered medications for this visit.    Allergies  Allergen Reactions  . Fentanyl Other (See Comments)    Sedated for days     REVIEW OF SYSTEMS:   [X]  denotes positive finding, [ ]  denotes negative finding Cardiac  Comments:  Chest pain or chest pressure:    Shortness of breath upon exertion:    Short of breath when lying flat:    Irregular heart rhythm:        Vascular    Pain in calf, thigh, or hip brought on by ambulation:    Pain in feet at night that wakes you up from your sleep:     Blood clot in your veins:    Leg swelling:         Pulmonary    Oxygen at home:    Productive cough:     Wheezing:         Neurologic    Sudden weakness in arms or legs:     Sudden numbness in arms or legs:     Sudden onset of difficulty speaking or slurred speech:    Temporary loss of vision in one eye:     Problems with dizziness:         Gastrointestinal    Blood in stool:     Vomited blood:         Genitourinary    Burning when urinating:     Blood in urine:        Psychiatric    Major depression:         Hematologic    Bleeding problems:    Problems with blood clotting too easily:        Skin    Rashes or ulcers:        Constitutional    Fever or chills:      PHYSICAL EXAMINATION: Vitals:   02/26/21 0948  BP: 124/78  Pulse: (!) 55  Resp: 20  Temp:  98.4 F (36.9 C)  SpO2: 100%    General:  WDWN in NAD; vital signs documented above Gait: Not observed HENT: WNL, normocephalic Pulmonary: normal non-labored  breathing  Cardiac: regular HR, regular rhythm Skin: without rashes Vascular Exam/Pulses: 2+ bilateral popliteal pulses.  Right dorsalis pedis and posterior tibial pulses are 2+.  Left dorsalis pedis and posterior tibial are 1+. Extremities: without ischemic changes, without Gangrene , without cellulitis; without open wounds; no edema Musculoskeletal: no muscle wasting or atrophy  Neurologic: A&O X 3;  No focal weakness or paresthesias are detected Psychiatric:  The pt has Normal affect.   Non-Invasive Vascular Imaging:   02/26/2021  IVC/Iliac: There is no evidence of thrombus involving the right common  iliac vein and left common iliac vein. There is no evidence of thrombus  involving the right external iliac vein and left external iliac vein.  Patent left common to external iliac vein    ASSESSMENT/PLAN:: 65 y.o. male with history of factor V Leiden mutation and past history of right lower extremity and left lower extremity DVT.  Most recently treated mechanical thrombectomy for right lower extremity DVT with angioplasty and Wallstent placement to right common iliac vein and right external iliac vein.  Discussed findings of bilateral lower extremity venous duplex today.  Stents are patent.  No lower extremity edema.  No evidence of phlegmasia.    Continue Xarelto.  Follow-up in 1 year with bilateral iliac vein venous duplex.  Advised him to call us sooner should he develop new lower extremity symptoms.  Milinda Antis, PA-C [facility] [phone]  Clinic MD:   [facility]

## 2021-03-15 ENCOUNTER — Other Ambulatory Visit: Payer: Self-pay | Admitting: Neurosurgery

## 2021-03-15 DIAGNOSIS — M48061 Spinal stenosis, lumbar region without neurogenic claudication: Secondary | ICD-10-CM

## 2021-11-25 ENCOUNTER — Other Ambulatory Visit: Payer: Self-pay | Admitting: Family

## 2021-11-25 DIAGNOSIS — S46191D Other injury of muscle, fascia and tendon of long head of biceps, right arm, subsequent encounter: Secondary | ICD-10-CM

## 2021-11-27 ENCOUNTER — Other Ambulatory Visit: Payer: Self-pay | Admitting: Family Medicine

## 2021-11-27 ENCOUNTER — Ambulatory Visit
Admission: RE | Admit: 2021-11-27 | Discharge: 2021-11-27 | Disposition: A | Discharge status: Home / Self Care | Payer: BC Managed Care – PPO | Source: Ambulatory Visit | Attending: Family Medicine | Admitting: Family Medicine

## 2021-11-27 ENCOUNTER — Ambulatory Visit
Admission: RE | Admit: 2021-11-27 | Discharge: 2021-11-27 | Disposition: A | Discharge status: Home / Self Care | Payer: BC Managed Care – PPO | Source: Ambulatory Visit | Attending: Family | Admitting: Family

## 2021-11-27 DIAGNOSIS — S46191D Other injury of muscle, fascia and tendon of long head of biceps, right arm, subsequent encounter: Secondary | ICD-10-CM

## 2021-11-27 DIAGNOSIS — T1490XA Injury, unspecified, initial encounter: Secondary | ICD-10-CM

## 2023-08-20 ENCOUNTER — Other Ambulatory Visit: Payer: Self-pay | Admitting: Family

## 2023-08-20 DIAGNOSIS — Z13820 Encounter for screening for osteoporosis: Secondary | ICD-10-CM

## 2023-08-21 IMAGING — DX DG SHOULDER 2+V*R*
3 series · 3 of 3 positions shown · non-contrast
Comparison: None

CLINICAL DATA: Injury 2 weeks ago throwing a rock into a pond,
pain,

EXAM:
RIGHT SHOULDER - 2+ VIEW

[AP]
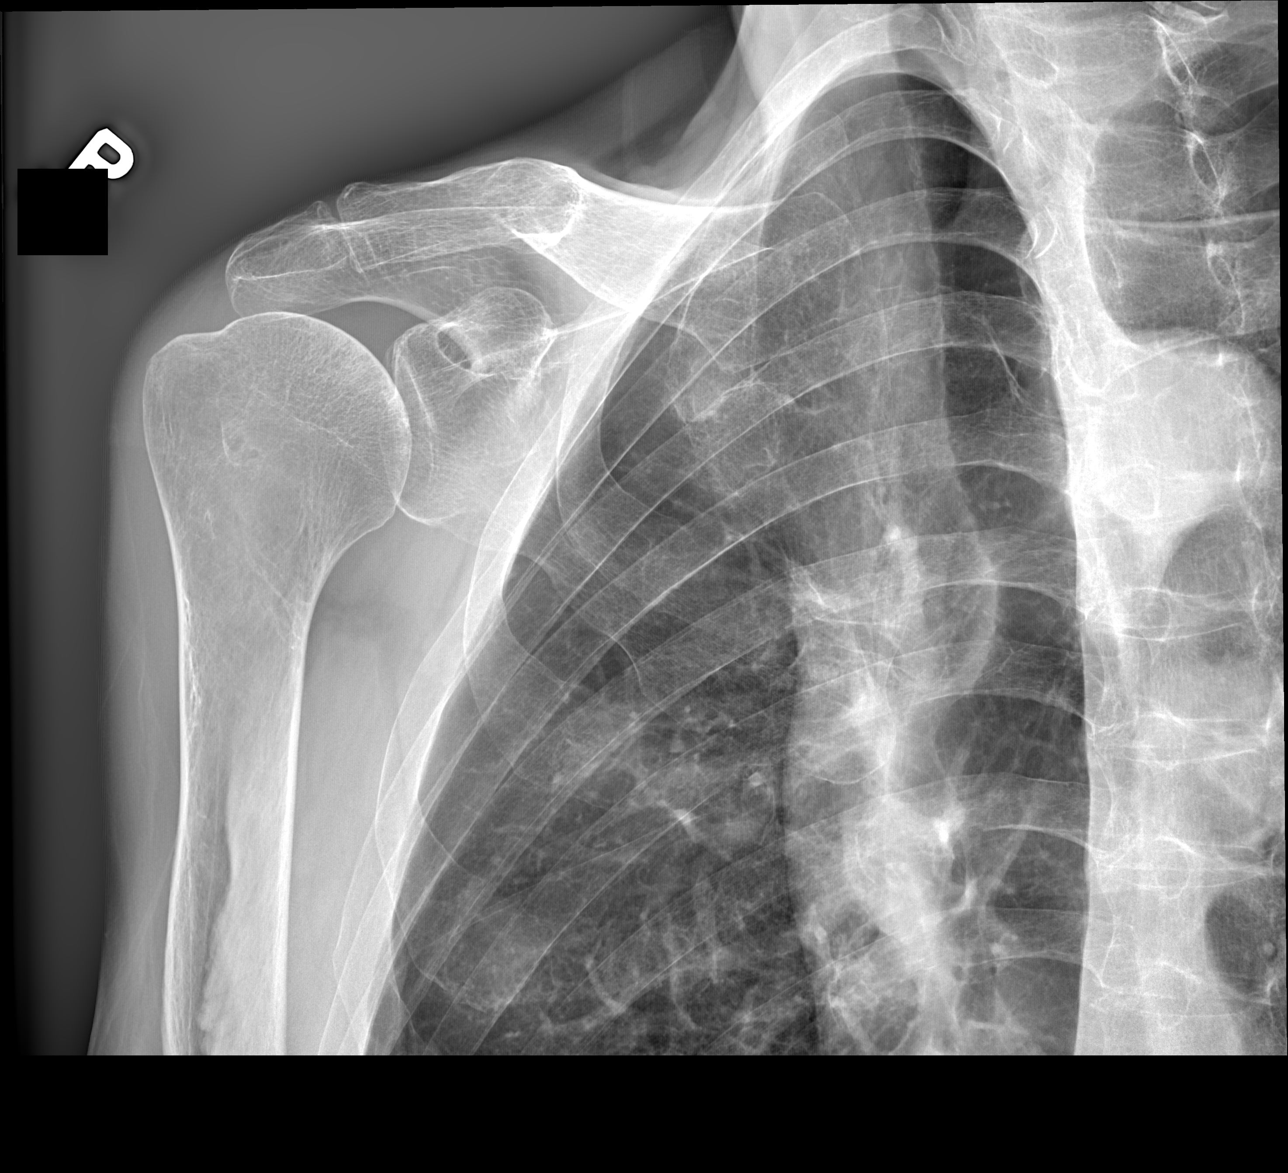

[view not recorded (1 of 2)]
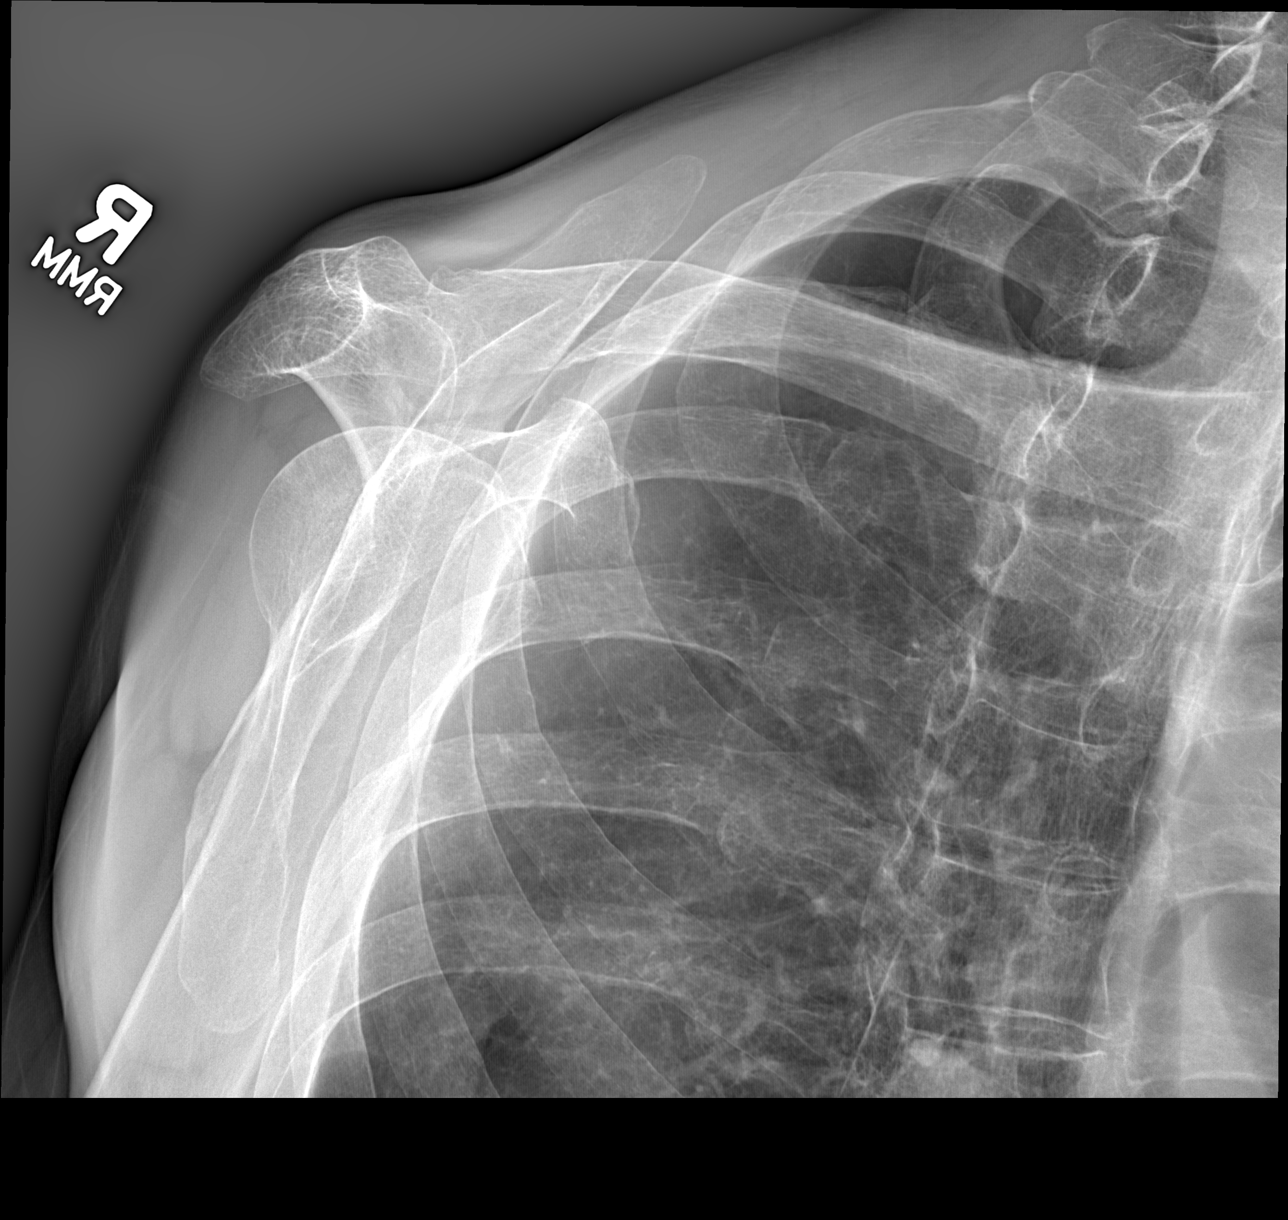

[view not recorded (2 of 2)]
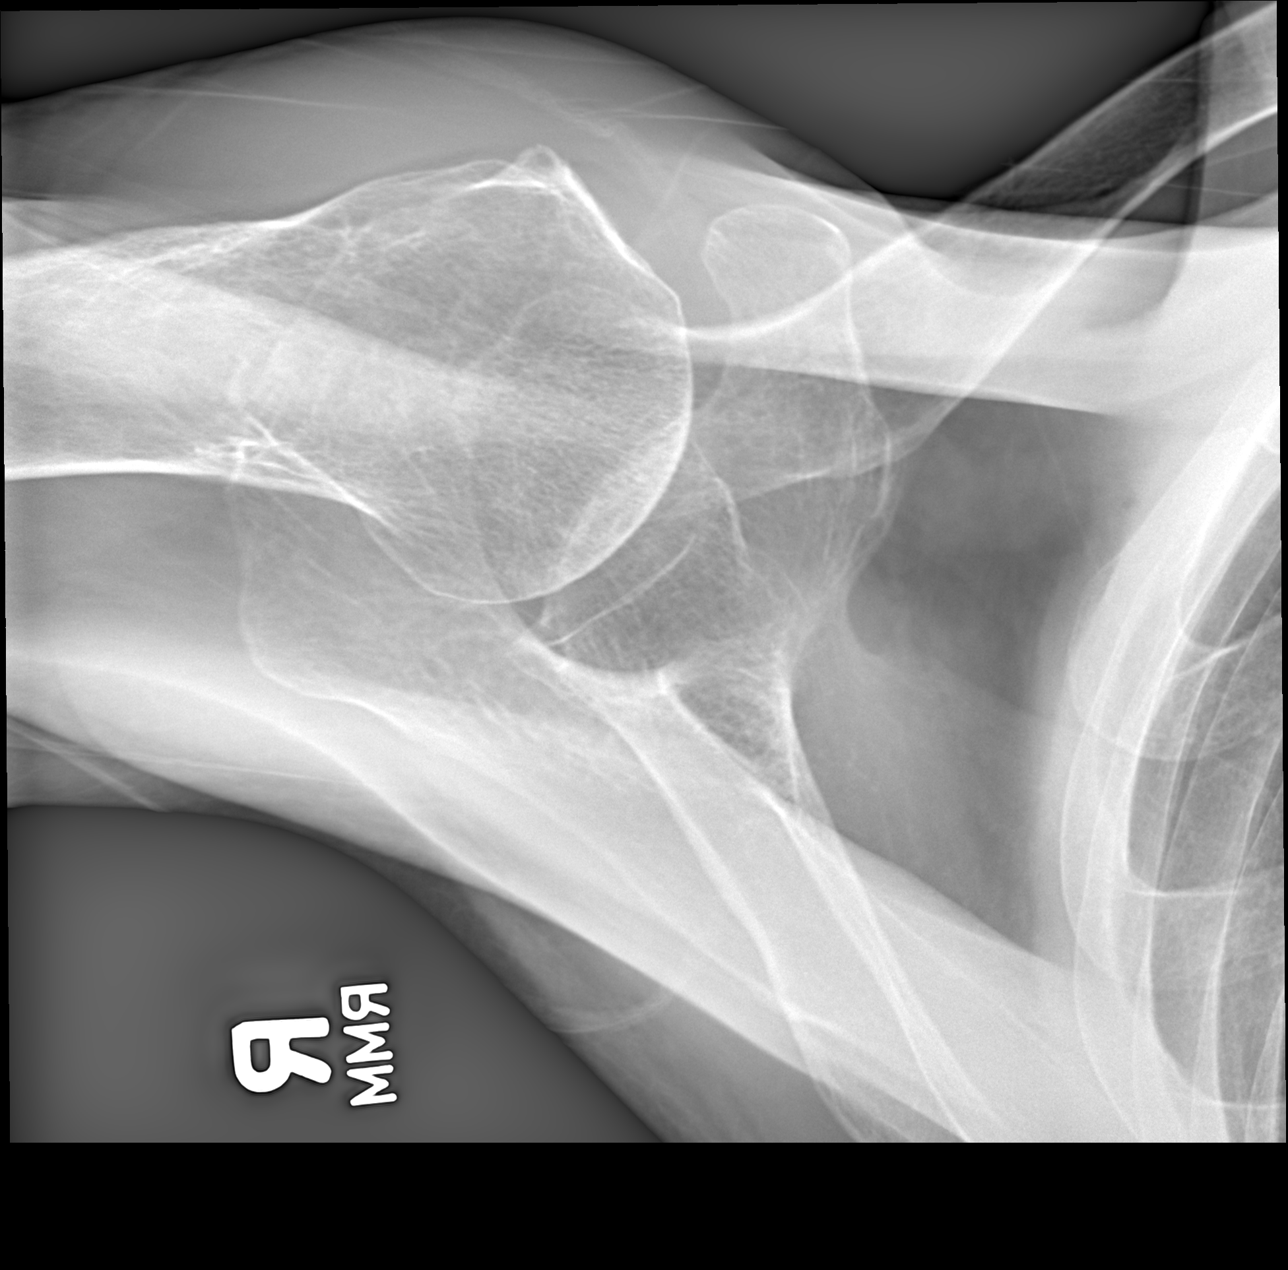

[3 of 3 positions shown; findings below may reference images not displayed]

FINDINGS: Osseous demineralization.

AC joint alignment normal.

Visualized RIGHT ribs intact.

No acute fracture, dislocation, or bone destruction.
IMPRESSION: No acute osseous abnormalities.

## 2023-10-12 ENCOUNTER — Telehealth: Payer: Self-pay

## 2023-10-12 NOTE — Telephone Encounter (Signed)
LVM for patient to return my call in reference to [facility] on 12/10, told him he could call me back or I would call him when the appointment time got closer in the meantime I will send packet

## 2023-10-14 ENCOUNTER — Other Ambulatory Visit (HOSPITAL_COMMUNITY): Payer: Self-pay | Admitting: Urology

## 2023-10-14 DIAGNOSIS — C61 Malignant neoplasm of prostate: Secondary | ICD-10-CM

## 2023-10-23 DIAGNOSIS — C61 Malignant neoplasm of prostate: Secondary | ICD-10-CM | POA: Insufficient documentation

## 2023-10-23 NOTE — Progress Notes (Unsigned)
[facility] CONSULT NOTE  Patient Care Team: Erskine Emery, NP as PCP - General  ASSESSMENT & PLAN:  Thomas Huerta is a 67 y.o.male with history of HTN, PVD, kidney stones being seen at [facility].  He was referred to urology for elevated PSA of 11.6 in September 2024.  He has TRUS prostate biopsies on 09/25/23 and found GS 4+3=7 in 5 of 12 cores on the right side. Size 31 g.  PSMA PET from 10/26/23 showed prostate only uptake and no signs of metastatic disease.  Current diagnosis: cT2aN0M0 PSA 11.6. GG3 unfavorable intermediate risk, majority pattern 4, 4+3.  The patient was counseled on the natural history of prostate cancer and the standard treatment options that are available for prostate cancer. For unfavorable intermediate risk, per NCCN guideline, in patients with > 10 years of life expectancy, RP + PLND or RT + ADT are both options resulted in excellent long term survival. Recommend proceed with treatment for his prostate cancer. Continue follow up with Urology post treatment for surveillance.  Supportive Weight-bearing exercises  Limit alcohol consumption and avoid smoking  All questions were answered. The patient knows to call the clinic with any problems, questions or concerns. No barriers to learning was detected.  Melven Sartorius, MD 12/10/20243:42 PM  CHIEF COMPLAINTS/PURPOSE OF CONSULTATION:  Prostate cancer  HISTORY OF PRESENTING ILLNESS:  Thomas Huerta 67 y.o. male is here because of prostate cancer. Report findings of elevated PSA with induration of right gland,  hematuria led to prostate biopsy.  He is otherwise feeling well without systemic symptoms.  I have reviewed his chart and materials related to his cancer extensively and collaborated history with the patient. Summary of oncologic history is as follows: Oncology History  Primary prostate adenocarcinoma (HCC)  07/29/2023 Tumor Marker   PSA 11.6   09/25/2023 Pathology Results    Prostate biopsy 5 cores Positive for prostatic adenocarcinoma. All GS 4+3=7 GG3. All right side.     09/25/2023 Cancer Staging   Staging form: Prostate, AJCC 8th Edition - Clinical stage from 09/25/2023: Stage IIC (cT2a, cN0, cM0, PSA: 11.6, Grade Group: 3) - Signed by Marcello Fennel, PA-C on 10/26/2023 Histopathologic type: Adenocarcinoma, NOS Stage prefix: Initial diagnosis Prostate specific antigen (PSA) range: 10 to 19 Gleason primary pattern: 4 Gleason secondary pattern: 3 Gleason score: 7 Histologic grading system: 5 grade system Number of biopsy cores examined: 12 Number of biopsy cores positive: 5 Location of positive needle core biopsies: One side   10/23/2023 Initial Diagnosis   Primary prostate adenocarcinoma [facility])     MEDICAL HISTORY:  Past Medical History:  Diagnosis Date   Back pain    chronic   Complication of anesthesia    over medicated with Fentanyl   History of kidney stones    5th time treating stones in 15 years   Obesity    history of prior to gastric bypass   Peripheral arterial disease (HCC)    Peripheral vascular disease (HCC)     SURGICAL HISTORY: Past Surgical History:  Procedure Laterality Date   ANGIOPLASTY ILLIAC ARTERY Left 09/11/2018   Procedure: BALLOON ANGIOPLASTY LEFT ILIAC VEIN;  Surgeon: Cephus Shelling, MD;  Location: [facility] OR;  Service: Vascular;  Laterality: Left;   CORONARY ULTRASOUND/IVUS N/A 09/11/2018   Procedure: INTRAVASCULAR ULTRASOUND/IVUS;  Surgeon: Cephus Shelling, MD;  Location: [facility] OR;  Service: Vascular;  Laterality: N/A;   CYST EXCISION     left lower back beside the spine  CYSTOSCOPY     CYSTOSCOPY W/ URETERAL STENT REMOVAL Left 08/18/2017   Procedure: CYSTOSCOPY WITH STENT REMOVAL;  Surgeon: Orson Ape, MD;  Location: [facility] ORS;  Service: Urology;  Laterality: Left;   CYSTOSCOPY WITH STENT PLACEMENT Left 06/16/2017   Procedure: CYSTOSCOPY WITH STENT PLACEMENT;  Surgeon: Orson Ape, MD;  Location:  [facility] ORS;  Service: Urology;  Laterality: Left;   ESOPHAGOGASTRODUODENOSCOPY (EGD) WITH PROPOFOL N/A 12/14/2018   Procedure: ESOPHAGOGASTRODUODENOSCOPY (EGD) WITH PROPOFOL;  Surgeon: Toney Reil, MD;  Location: [facility] ENDOSCOPY;  Service: Gastroenterology;  Laterality: N/A;   EXTRACORPOREAL SHOCK WAVE LITHOTRIPSY Left 06/25/2017   Procedure: EXTRACORPOREAL SHOCK WAVE LITHOTRIPSY (ESWL);  Surgeon: Orson Ape, MD;  Location: [facility] ORS;  Service: Urology;  Laterality: Left;   EXTRACORPOREAL SHOCK WAVE LITHOTRIPSY Left 08/06/2017   Procedure: EXTRACORPOREAL SHOCK WAVE LITHOTRIPSY (ESWL);  Surgeon: Orson Ape, MD;  Location: [facility] ORS;  Service: Urology;  Laterality: Left;   EXTRACORPOREAL SHOCK WAVE LITHOTRIPSY Right 11/05/2017   Procedure: EXTRACORPOREAL SHOCK WAVE LITHOTRIPSY (ESWL);  Surgeon: Orson Ape, MD;  Location: [facility] ORS;  Service: Urology;  Laterality: Right;   GASTRIC BYPASS  2004   HERNIA REPAIR Bilateral    inguinal hernias   INSERTION OF ILIAC STENT Left 09/11/2018   Procedure: INSERTION OF LEFT ILIAC STENT;  Surgeon: Cephus Shelling, MD;  Location: [facility] OR;  Service: Vascular;  Laterality: Left;   LOWER EXTREMITY VENOGRAPHY Left 09/10/2018   Procedure: LOWER EXTREMITY VENOGRAPHY;  Surgeon: Sherren Kerns, MD;  Location: [facility] INVASIVE CV LAB;  Service: Cardiovascular;  Laterality: Left;   PERIPHERAL VASCULAR INTERVENTION  07/25/2020   Procedure: PERIPHERAL VASCULAR INTERVENTION;  Surgeon: Cephus Shelling, MD;  Location: [facility] INVAS[facility];  Service: Cardiovascular;;  lower rt external and common femoral venous stent   PERIPHERAL VASCULAR THROMBECTOMY Left 09/10/2018   Procedure: PERIPHERAL VASCULAR THROMBECTOMY;  Surgeon: Sherren Kerns, MD;  Location: [facility];  Service: Cardiovascular;  Laterality: Left;  lower extremity venous system.   PERIPHERAL VASCULAR THROMBECTOMY Right 07/25/2020   Procedure: PERIPHERAL VASCULAR THROMBECTOMY;  Surgeon:  Cephus Shelling, MD;  Location: [facility] INV[facility];  Service: Cardiovascular;  Laterality: Right;   TONSILLECTOMY     and addenoids   TONSILLECTOMY AND ADENOIDECTOMY     URETEROSCOPY WITH HOLMIUM LASER LITHOTRIPSY Left 06/16/2017   Procedure: URETEROSCOPY WITH HOLMIUM LASER LITHOTRIPSY;  Surgeon: Orson Ape, MD;  Location: [facility] ORS;  Service: Urology;  Laterality: Left;   VENOGRAM Left 09/11/2018   Procedure: VENOGRAM LEFT LEG;  Surgeon: Cephus Shelling, MD;  Location: [facility] OR;  Service: Vascular;  Laterality: Left;    SOCIAL HISTORY: Social History   Socioeconomic History   Marital status: Married    Spouse name: Not on file   Number of children: Not on file   Years of education: Not on file   Highest education level: Not on file  Occupational History   Not on file  Tobacco Use   Smoking status: Former    Current packs/day: 0.00    Types: Cigarettes    Quit date: 11/01/2018    Years since quitting: 4.9   Smokeless tobacco: Never  Vaping Use   Vaping status: Never Used  Substance and Sexual Activity   Alcohol use: No   Drug use: Yes    Types: Oxycodone    Comment: past history age 26 with perscription Percocet   Sexual activity: Not on file  Other Topics Concern  Not on file  Social History Narrative   Not on file   Social Determinants of Health   Financial Resource Strain: Low Risk  (01/31/2022)   Received [facility]   Overall Financial Resource Strain (CARDIA)    Difficulty of Paying Living Expenses: Not hard at all  Food Insecurity: No Food Insecurity (10/27/2023)   Hunger Vital Sign    Worried About Running Out of Food in the Last Year: Never true    Ran Out of Food in the Last Year: Never true  Transportation Needs: No Transportation Needs (10/27/2023)   PRAPARE - Administrator, Civil Service (Medical): No    Lack of Transportation (Non-Medical): No  Physical Activity: Not on file  Stress: Not on file  Social  Connections: Unknown (04/01/2022)   [facility]   Social Network    Social Network: Not on file  Intimate Partner Violence: Not At Risk (10/27/2023)   Humiliation, Afraid, Rape, and Kick questionnaire    Fear of Current or Ex-Partner: No    Emotionally Abused: No    Physically Abused: No    Sexually Abused: No    FAMILY HISTORY: Family History  Problem Relation Age of Onset   Lymphoma Mother    Colon cancer Father     ALLERGIES:  is allergic to fentanyl.  MEDICATIONS:  Current Outpatient Medications  Medication Sig Dispense Refill   allopurinol (ZYLOPRIM) 300 MG tablet allopurinol 300 mg tablet  TAKE 1 TABLET BY MOUTH EVERY DAY     baclofen (LIORESAL) 10 MG tablet Take 5-10 mg by mouth 3 (three) times daily as needed for muscle spasms.      Buprenorphine HCl-Naloxone HCl 12-3 MG FILM Place 0.5 Film under the tongue in the morning, at noon, in the evening, and at bedtime.     CALCIUM PO Take 1 tablet by mouth daily.     Cholecalciferol (VITAMIN D3 PO) Take 1 tablet by mouth daily.     cloNIDine (CATAPRES) 0.1 MG tablet Take by mouth.     colchicine 0.6 MG tablet Take 0.6 mg by mouth 2 (two) times daily.     Ferrous Sulfate (IRON PO) Take 1 tablet by mouth daily.     indomethacin (INDOCIN) 25 MG capsule Take 25 mg by mouth 3 (three) times daily as needed (gout flares).      Multiple Vitamins-Minerals (MULTIVITAMIN WITH MINERALS) tablet Take 1 tablet by mouth daily.     orphenadrine (NORFLEX) 100 MG tablet Take 1 tablet (100 mg total) by mouth 2 (two) times daily. 10 tablet 0   rivaroxaban (XARELTO) 10 MG TABS tablet      spironolactone (ALDACTONE) 50 MG tablet Take 50 mg by mouth daily.     tamsulosin (FLOMAX) 0.4 MG CAPS capsule Take 1 capsule by mouth daily.     vitamin B-12 (CYANOCOBALAMIN) 500 MCG tablet Take 1 tablet (500 mcg total) by mouth daily. 90 tablet 3   No current facility-administered medications for this visit.    REVIEW OF SYSTEMS:   All  relevant systems were reviewed with the patient and are negative.  PHYSICAL EXAMINATION: ECOG PERFORMANCE STATUS: 0 - Asymptomatic  There were no vitals filed for this visit. There were no vitals filed for this visit.  GENERAL: alert, no distress and comfortable  LABORATORY DATA:  I have reviewed the data as listed Lab Results  Component Value Date   WBC 4.9 06/20/2019   HGB 11.2 (L) 07/25/2020   HCT  33.0 (L) 07/25/2020   MCV 104.8 (H) 06/20/2019   PLT 223 06/20/2019   No results for input(s): "NA", "K", "CL", "CO2", "GLUCOSE", "BUN", "CREATININE", "CALCIUM", "GFRNONAA", "GFRAA", "PROT", "ALBUMIN", "AST", "ALT", "ALKPHOS", "BILITOT", "BILIDIR", "IBILI" in the last 8760 hours.  RADIOGRAPHIC STUDIES: I have personally reviewed the radiological images as listed and agreed with the findings in the report. NM PET (PSMA) SKULL TO MID THIGH  Result Date: 10/27/2023 CLINICAL DATA:  Newly diagnosed prostate carcinoma.  Staging. EXAM: NUCLEAR MEDICINE PET SKULL BASE TO THIGH TECHNIQUE: 8.3 mCi Flotufolastat (Posluma) was injected intravenously. Full-ring PET imaging was performed from the skull base to thigh after the radiotracer. CT data was obtained and used for attenuation correction and anatomic localization. COMPARISON:  None Available. FINDINGS: NECK No radiotracer activity in neck lymph nodes. Incidental CT finding: None. CHEST No radiotracer accumulation within mediastinal or hilar lymph nodes. No suspicious pulmonary nodules on the CT scan. Incidental CT finding: None. ABDOMEN/PELVIS Prostate: Focal site of intense radiotracer accumulation is seen in the right central prostate, with SUV max of 20.2. Lymph nodes: No abnormal radiotracer accumulation within pelvic or abdominal nodes. Liver: No evidence of liver metastasis. Incidental CT finding: Previous gastric bypass surgery with small hiatal hernia noted. Bilateral less than 1 cm renal calculi, without hydronephrosis. Bilateral iliac vein  stents incidentally noted. SKELETON No focal activity to suggest skeletal metastasis. IMPRESSION: Focal intense radiotracer accumulation in right central prostate, consistent with primary prostate carcinoma. No evidence of metastatic disease. Bilateral nephrolithiasis incidentally noted.  No hydronephrosis. Electronically Signed   By: Danae Orleans M.D.   On: 10/27/2023 11:47

## 2023-10-26 ENCOUNTER — Encounter (HOSPITAL_COMMUNITY)
Admission: RE | Admit: 2023-10-26 | Discharge: 2023-10-26 | Disposition: A | Discharge status: Home / Self Care | Payer: Medicare Other | Source: Ambulatory Visit | Attending: Urology | Admitting: Urology

## 2023-10-26 DIAGNOSIS — C61 Malignant neoplasm of prostate: Secondary | ICD-10-CM | POA: Insufficient documentation

## 2023-10-26 MED ORDER — FLOTUFOLASTAT F 18 GALLIUM 296-5846 MBQ/ML IV SOLN
8.2940 | Freq: Once | INTRAVENOUS | Status: AC
  Administered 2023-10-26: 8.294 via INTRAVENOUS

## 2023-10-26 NOTE — Progress Notes (Signed)
STAT read request placed for PSMA PET prior to upcoming PMDC on 12/10.

## 2023-10-26 NOTE — Progress Notes (Unsigned)
                               Care Plan Summary  Name: Thomas Huerta  DOB: 10/11/1956   Your Medical Team:   Urologist -  Dr. Heloise Purpura, [facility]  Radiation Oncologist - Dr. Margaretmary Dys, [facility]   Medical Oncologist - Dr. Geanie Berlin, [facility]  Recommendations: 1) Hormonal therapy with radiation   2) Surgery    * These recommendations are based on information available as of today's consult.      Recommendations may change depending on the results of further tests or exams.    Next Steps: 1) Consider all your options and contact Marisue Ivan, your nurse navigator, with any questions or treatment decisions.    When appointments need to be scheduled, you will be contacted by [facility] and/or [facility].  Questions?  Please do not hesitate to call Cherlyn Cushing, BSN, RN at [phone] with any questions or concerns.  Marisue Ivan is your Oncology Nurse Navigator and is available to assist you while you're receiving your medical care at [facility].

## 2023-10-27 ENCOUNTER — Ambulatory Visit
Admission: RE | Admit: 2023-10-27 | Discharge: 2023-10-27 | Disposition: A | Discharge status: Home / Self Care | Payer: Medicare Other | Source: Ambulatory Visit | Attending: Radiation Oncology | Admitting: Radiation Oncology

## 2023-10-27 ENCOUNTER — Inpatient Hospital Stay: Payer: Medicare Other

## 2023-10-27 VITALS — BP 133/88 | HR 53 | Temp 97.4°F | Resp 20 | Ht 69.0 in | Wt 162.8 lb

## 2023-10-27 DIAGNOSIS — R9721 Rising PSA following treatment for malignant neoplasm of prostate: Secondary | ICD-10-CM | POA: Insufficient documentation

## 2023-10-27 DIAGNOSIS — C61 Malignant neoplasm of prostate: Secondary | ICD-10-CM | POA: Insufficient documentation

## 2023-10-27 DIAGNOSIS — Z79899 Other long term (current) drug therapy: Secondary | ICD-10-CM | POA: Diagnosis not present

## 2023-10-27 DIAGNOSIS — Z8546 Personal history of malignant neoplasm of prostate: Secondary | ICD-10-CM | POA: Diagnosis not present

## 2023-10-27 NOTE — Progress Notes (Signed)
Radiation Oncology         [phone] ________________________________  Multidisciplinary Prostate Cancer Clinic  Initial Radiation Oncology Consultation  Name: Thomas Huerta MRN: [id number]  Date: 10/27/2023  DOB: 09/27/1956  NW:GNFAO, Orie Rout, NP  Noel Christmas, MD   REFERRING PHYSICIAN: Noel Christmas, MD  DIAGNOSIS: 67 y.o. 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 Malignant neoplasm of prostate (HCC)  C61     2. Primary prostate adenocarcinoma (HCC)  C61       HISTORY OF PRESENT ILLNESS::Thomas Huerta is a 67 y.o. gentleman.  He was noted to have an elevated PSA of 11.6 by his primary care Francie Massing, NP. He also has a notable history of urolithiasis and intermittent gross hematuria, previously managed by Dr. Evelene Croon but no formal hematuria evaluation. Accordingly, he was referred for evaluation in urology by Dr. Arita Miss on 08/12/23,  digital rectal examination performed at that time showed a firm right mid/base without induration.  The patient proceeded to transrectal ultrasound with 12 biopsies of the prostate on 09/25/23.  The prostate volume measured 31 cc.  Out of 12 core biopsies, 5 were positive, all on the right side.  The maximum Gleason score was 4+3, and this was seen in all three right lateral cores, right apex, and right mid.        He underwent staging PSMA PET scan yesterday, 10/26/23, showing no evidence of disease outside of the prostate.  The patient reviewed the biopsy results with his urologist and he has kindly been referred today to the multidisciplinary prostate cancer clinic for presentation of pathology and radiology studies in our conference for discussion of potential radiation treatment options and clinical evaluation.    PREVIOUS RADIATION THERAPY: No  PAST MEDICAL HISTORY:  has a past medical history of Back pain, Complication of  anesthesia, History of kidney stones, Obesity, Peripheral arterial disease (HCC), and Peripheral vascular disease (HCC).    PAST SURGICAL HISTORY: Past Surgical History:  Procedure Laterality Date   ANGIOPLASTY ILLIAC ARTERY Left 09/11/2018   Procedure: BALLOON ANGIOPLASTY LEFT ILIAC VEIN;  Surgeon: Cephus Shelling, MD;  Location: [facility] OR;  Service: Vascular;  Laterality: Left;   CORONARY ULTRASOUND/IVUS N/A 09/11/2018   Procedure: INTRAVASCULAR ULTRASOUND/IVUS;  Surgeon: Cephus Shelling, MD;  Location: [facility] OR;  Service: Vascular;  Laterality: N/A;   CYST EXCISION     left lower back beside the spine   CYSTOSCOPY     CYSTOSCOPY W/ URETERAL STENT REMOVAL Left 08/18/2017   Procedure: CYSTOSCOPY WITH STENT REMOVAL;  Surgeon: Orson Ape, MD;  Location: [facility] ORS;  Service: Urology;  Laterality: Left;   CYSTOSCOPY WITH STENT PLACEMENT Left 06/16/2017   Procedure: CYSTOSCOPY WITH STENT PLACEMENT;  Surgeon: Orson Ape, MD;  Location: [facility] ORS;  Service: Urology;  Laterality: Left;   ESOPHAGOGASTRODUODENOSCOPY (EGD) WITH PROPOFOL N/A 12/14/2018   Procedure: ESOPHAGOGASTRODUODENOSCOPY (EGD) WITH PROPOFOL;  Surgeon: Toney Reil, MD;  Location: [facility] ENDOSCOPY;  Service: Gastroenterology;  Laterality: N/A;   EXTRACORPOREAL SHOCK WAVE LITHOTRIPSY Left 06/25/2017   Procedure: EXTRACORPOREAL SHOCK WAVE LITHOTRIPSY (ESWL);  Surgeon: Orson Ape, MD;  Location: [facility] ORS;  Service: Urology;  Laterality: Left;   EXTRACORPOREAL SHOCK WAVE LITHOTRIPSY Left 08/06/2017   Procedure: EXTRACORPOREAL SHOCK WAVE LITHOTRIPSY (ESWL);  Surgeon: Orson Ape, MD;  Location: [facility] ORS;  Service: Urology;  Laterality: Left;  EXTRACORPOREAL SHOCK WAVE LITHOTRIPSY Right 11/05/2017   Procedure: EXTRACORPOREAL SHOCK WAVE LITHOTRIPSY (ESWL);  Surgeon: Orson Ape, MD;  Location: [facility] ORS;  Service: Urology;  Laterality: Right;   GASTRIC BYPASS  2004   HERNIA REPAIR Bilateral    inguinal hernias    INSERTION OF ILIAC STENT Left 09/11/2018   Procedure: INSERTION OF LEFT ILIAC STENT;  Surgeon: Cephus Shelling, MD;  Location: [facility] OR;  Service: Vascular;  Laterality: Left;   LOWER EXTREMITY VENOGRAPHY Left 09/10/2018   Procedure: LOWER EXTREMITY VENOGRAPHY;  Surgeon: Sherren Kerns, MD;  Location: [facility] INVASIVE CV LAB;  Service: Cardiovascular;  Laterality: Left;   PERIPHERAL VASCULAR INTERVENTION  07/25/2020   Procedure: PERIPHERAL VASCULAR INTERVENTION;  Surgeon: Cephus Shelling, MD;  Location: [facility] INVASIVE CV LAB;  Service: Cardiovascular;;  lower rt external and common femoral venous stent   PERIPHERAL VASCULAR THROMBECTOMY Left 09/10/2018   Procedure: PERIPHERAL VASCULAR THROMBECTOMY;  Surgeon: Sherren Kerns, MD;  Location: [facility] INVASIVE CV LAB;  Service: Cardiovascular;  Laterality: Left;  lower extremity venous system.   PERIPHERAL VASCULAR THROMBECTOMY Right 07/25/2020   Procedure: PERIPHERAL VASCULAR THROMBECTOMY;  Surgeon: Cephus Shelling, MD;  Location: [facility] INVASIVE CV LAB;  Service: Cardiovascular;  Laterality: Right;   TONSILLECTOMY     and addenoids   TONSILLECTOMY AND ADENOIDECTOMY     URETEROSCOPY WITH HOLMIUM LASER LITHOTRIPSY Left 06/16/2017   Procedure: URETEROSCOPY WITH HOLMIUM LASER LITHOTRIPSY;  Surgeon: Orson Ape, MD;  Location: [facility] ORS;  Service: Urology;  Laterality: Left;   VENOGRAM Left 09/11/2018   Procedure: VENOGRAM LEFT LEG;  Surgeon: Cephus Shelling, MD;  Location: [facility] OR;  Service: Vascular;  Laterality: Left;    FAMILY HISTORY: family history includes Colon cancer in his father; Lymphoma in his mother.  SOCIAL HISTORY:  reports that he quit smoking about 4 years ago. His smoking use included cigarettes. He has never used smokeless tobacco. He reports current drug use. Drug: Oxycodone. He reports that he does not drink alcohol.  ALLERGIES: Fentanyl  MEDICATIONS:  Current Outpatient Medications  Medication Sig Dispense Refill    cloNIDine (CATAPRES) 0.1 MG tablet Take by mouth.     allopurinol (ZYLOPRIM) 300 MG tablet allopurinol 300 mg tablet  TAKE 1 TABLET BY MOUTH EVERY DAY     baclofen (LIORESAL) 10 MG tablet Take 5-10 mg by mouth 3 (three) times daily as needed for muscle spasms.      Buprenorphine HCl-Naloxone HCl 12-3 MG FILM Place 0.5 Film under the tongue in the morning, at noon, in the evening, and at bedtime.     CALCIUM PO Take 1 tablet by mouth daily.     Cholecalciferol (VITAMIN D3 PO) Take 1 tablet by mouth daily.     colchicine 0.6 MG tablet Take 0.6 mg by mouth 2 (two) times daily.     Ferrous Sulfate (IRON PO) Take 1 tablet by mouth daily.     indomethacin (INDOCIN) 25 MG capsule Take 25 mg by mouth 3 (three) times daily as needed (gout flares).      Multiple Vitamins-Minerals (MULTIVITAMIN WITH MINERALS) tablet Take 1 tablet by mouth daily.     orphenadrine (NORFLEX) 100 MG tablet Take 1 tablet (100 mg total) by mouth 2 (two) times daily. 10 tablet 0   rivaroxaban (XARELTO) 10 MG TABS tablet      spironolactone (ALDACTONE) 50 MG tablet Take 50 mg by mouth daily.     tamsulosin (FLOMAX) 0.4 MG CAPS capsule Take 1 capsule  by mouth daily.     vitamin B-12 (CYANOCOBALAMIN) 500 MCG tablet Take 1 tablet (500 mcg total) by mouth daily. 90 tablet 3   No current facility-administered medications for this encounter.    REVIEW OF SYSTEMS:  On review of systems, the patient reports that he is doing well overall. He denies any chest pain, shortness of breath, cough, fevers, chills, night sweats, unintended weight changes. He denies any bowel disturbances, and denies abdominal pain, nausea or vomiting. He denies any new musculoskeletal or joint aches or pains. His IPSS was 3, indicating mild urinary symptoms. His SHIM was 25, indicating he does not have erectile dysfunction. A complete review of systems is obtained and is otherwise negative.   PHYSICAL EXAM:  Wt Readings from Last 3 Encounters:  10/27/23 162 lb  12.8 oz (73.8 kg)  02/26/21 149 lb 8 oz (67.8 kg)  11/26/20 149 lb 4 oz (67.7 kg)   Temp Readings from Last 3 Encounters:  10/27/23 (!) 97.4 F (36.3 C)  02/26/21 98.4 F (36.9 C) (Temporal)  11/26/20 98.3 F (36.8 C)   BP Readings from Last 3 Encounters:  10/27/23 133/88  02/26/21 124/78  11/26/20 130/74   Pulse Readings from Last 3 Encounters:  10/27/23 (!) 53  02/26/21 (!) 55  11/26/20 88    /10  In general this is a well appearing Caucasian man in no acute distress. He's alert and oriented x4 and appropriate throughout the examination. Cardiopulmonary assessment is negative for acute distress and he exhibits normal effort.    KPS = 100  100 - Normal; no complaints; no evidence of disease. 90   - Able to carry on normal activity; minor signs or symptoms of disease. 80   - Normal activity with effort; some signs or symptoms of disease. 33   - Cares for self; unable to carry on normal activity or to do active work. 60   - Requires occasional assistance, but is able to care for most of his personal needs. 50   - Requires considerable assistance and frequent medical care. 40   - Disabled; requires special care and assistance. 30   - Severely disabled; hospital admission is indicated although death not imminent. 20   - Very sick; hospital admission necessary; active supportive treatment necessary. 10   - Moribund; fatal processes progressing rapidly. 0     - Dead  Karnofsky DA, Abelmann WH, Craver LS and Burchenal [facility] [phone]) The use of the nitrogen mustards in the palliative treatment of carcinoma: with particular reference to bronchogenic carcinoma Cancer 1 [id number]   LABORATORY DATA:  Lab Results  Component Value Date   WBC 4.9 06/20/2019   HGB 11.2 (L) 07/25/2020   HCT 33.0 (L) 07/25/2020   MCV 104.8 (H) 06/20/2019   PLT 223 06/20/2019   Lab Results  Component Value Date   NA 143 07/25/2020   K 3.6 07/25/2020   CL 106 07/25/2020   CO2 22 08/04/2019   Lab  Results  Component Value Date   ALT 15 08/04/2019   AST 22 08/04/2019   GGT 64 12/09/2018   ALKPHOS 118 08/04/2019   BILITOT 0.4 08/04/2019     RADIOGRAPHY: NM PET (PSMA) SKULL TO MID THIGH  Result Date: 10/27/2023 CLINICAL DATA:  Newly diagnosed prostate carcinoma.  Staging. EXAM: NUCLEAR MEDICINE PET SKULL BASE TO THIGH TECHNIQUE: 8.3 mCi Flotufolastat (Posluma) was injected intravenously. Full-ring PET imaging was performed from the skull base to thigh after the radiotracer. CT data was obtained  and used for attenuation correction and anatomic localization. COMPARISON:  None Available. FINDINGS: NECK No radiotracer activity in neck lymph nodes. Incidental CT finding: None. CHEST No radiotracer accumulation within mediastinal or hilar lymph nodes. No suspicious pulmonary nodules on the CT scan. Incidental CT finding: None. ABDOMEN/PELVIS Prostate: Focal site of intense radiotracer accumulation is seen in the right central prostate, with SUV max of 20.2. Lymph nodes: No abnormal radiotracer accumulation within pelvic or abdominal nodes. Liver: No evidence of liver metastasis. Incidental CT finding: Previous gastric bypass surgery with small hiatal hernia noted. Bilateral less than 1 cm renal calculi, without hydronephrosis. Bilateral iliac vein stents incidentally noted. SKELETON No focal activity to suggest skeletal metastasis. IMPRESSION: Focal intense radiotracer accumulation in right central prostate, consistent with primary prostate carcinoma. No evidence of metastatic disease. Bilateral nephrolithiasis incidentally noted.  No hydronephrosis. Electronically Signed   By: Danae Orleans M.D.   On: 10/27/2023 11:47      IMPRESSION/PLAN: 68 y.o. gentleman with Stage T2a adenocarcinoma of the prostate with a Gleason score of 4+3 and a PSA of 11.6.    We discussed the patient's workup and outlined the nature of prostate cancer in this setting. The patient's T stage, Gleason's score, and PSA put him  into the unfavorable intermediate risk group. Accordingly, he is eligible for a variety of potential treatment options including brachytherapy, 5.5 weeks of external radiation, or prostatectomy. We discussed the available radiation techniques, and focused on the details and logistics of delivery. We discussed and outlined the risks, benefits, short and long-term effects associated with radiotherapy and compared and contrasted these with prostatectomy. We discussed the role of SpaceOAR gel in reducing the rectal toxicity associated with radiotherapy.  We also detailed the role of ADT in the treatment of unfavorable intermediate risk prostate cancer and outlined the associated side effects that could be expected with this therapy. He appears to have a good understanding of his disease and our treatment recommendations which are of curative intent.  He was encouraged to ask questions that were answered to his/their stated satisfaction.  At the end of the conversation the patient is undecided regarding his treatment preference and would like to take some additional time to consider his options prior to committing to treatment.  He will also meet with Dr. Laverle Patter and Dr. Cherly Hensen as part of the multidisciplinary clinic this afternoon to further discuss surgical options and ADT.  He has our contact information and will let us know once he reaches a final decision if he should elect to proceed with ST-ADT concurrent with 5 and half weeks of daily external beam radiation for brachytherapy.  We enjoyed meeting him and his son today and look forward to following along in his care.  They know that they are welcome to call at anytime with any further questions or concerns related to our discussion today.  We personally spent 60 minutes in this encounter including chart review, reviewing radiological studies, meeting face-to-face with the patient, entering orders and completing documentation.    Marguarite Arbour, PA-C     Margaretmary Dys, MD  [facility]  Radiation Oncology Direct Dial: [phone]  Fax: [phone] [email or web address]  Skype  [email or web address]   This document serves as a record of services personally performed by Margaretmary Dys, MD and Marcello Fennel, PA-C. It was created on their behalf by Mickie Bail, a trained medical scribe. The creation of this record is based on the scribe's personal observations and the provider's statements to them. This document  has been checked and approved by the attending provider.
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
## 2023-10-28 ENCOUNTER — Encounter: Payer: Self-pay | Admitting: Genetic Counselor

## 2023-10-28 ENCOUNTER — Telehealth: Payer: Self-pay | Admitting: Genetic Counselor

## 2023-10-28 NOTE — Telephone Encounter (Signed)
Thomas Huerta was seen by a genetic counselor during the prostate multidisciplinary clinic on 10/27/2023. In addition to his personal history of prostate cancer, he reported a family history of colon cancer in his father diagnosed at age 65, one paternal uncle diagnosed with prostate cancer at age 40, a brother diagnosed with a gastrointestinal cancer at age 71, mother diagnosed with lymphoma at age 74, and a maternal aunt diagnosed with lymphoma in her late 102s. He does not meet NCCN criteria for genetic testing at this time. He was still offered genetic counseling and testing but declined. We encourage him to contact us if there are any changes to his personal or family history of cancer. If he meets NCCN criteria based on the updated personal/family history, he would be recommended to have genetic counseling and testing.   Thomas Brothers, MS, Thomas Huerta Genetic Counselor [email or web address] (P) [phone]

## 2023-11-03 NOTE — Progress Notes (Signed)
RN spoke with patient to confirm next steps for Cysto on 1/6 with Dr. Arita Miss.   Surgery date for prostatectomy still pending, patient aware this will be followed.  No additional needs at this time.

## 2023-11-04 ENCOUNTER — Other Ambulatory Visit: Payer: Self-pay | Admitting: Urology

## 2023-11-04 DIAGNOSIS — R310 Gross hematuria: Secondary | ICD-10-CM

## 2023-11-19 ENCOUNTER — Ambulatory Visit (HOSPITAL_COMMUNITY)
Admission: RE | Admit: 2023-11-19 | Discharge: 2023-11-19 | Disposition: A | Discharge status: Home / Self Care | Payer: Medicare Other | Source: Ambulatory Visit | Attending: Urology | Admitting: Urology

## 2023-11-19 DIAGNOSIS — R310 Gross hematuria: Secondary | ICD-10-CM | POA: Diagnosis present

## 2023-11-19 MED ORDER — IOHEXOL 240 MG/ML SOLN
100.0000 mL | Freq: Once | INTRAMUSCULAR | Status: AC | PRN
  Administered 2023-11-19: 100 mL via INTRAVENOUS

## 2023-12-04 NOTE — Progress Notes (Signed)
RN left message with surgery scheduler at [facility] to follow up on status of surgery.

## 2023-12-08 ENCOUNTER — Other Ambulatory Visit: Payer: Self-pay | Admitting: Urology

## 2023-12-17 ENCOUNTER — Encounter (HOSPITAL_COMMUNITY): Payer: Self-pay

## 2023-12-17 NOTE — Patient Instructions (Signed)
SURGICAL WAITING ROOM VISITATION Patients having surgery or a procedure may have no more than 2 support people in the waiting area - these visitors may rotate.    Children under the age of 92 must have an adult with them who is not the patient.  Due to an increase in RSV and influenza rates and associated hospitalizations, children ages 87 and under may not visit patients in [facility] hospitals.   If the patient needs to stay at the hospital during part of their recovery, the visitor guidelines for inpatient rooms apply. Pre-op nurse will coordinate an appropriate time for 1 support person to accompany patient in pre-op.  This support person may not rotate.    Please refer to the [facility] website for the visitor guidelines for Inpatients (after your surgery is over and you are in a regular room).       Your procedure is scheduled on: 12-24-23   Report to [facility] Main Entrance    Report to admitting at 5:15 AM   Call this number if you have problems the morning of surgery [phone]   Do not eat food or drink liquids:After Midnight.           If you have questions, please contact your surgeon's office.   FOLLOW BOWEL PREP AND ANY ADDITIONAL PRE OP INSTRUCTIONS YOU RECEIVED FROM YOUR SURGEON'S OFFICE!!!      Magnesium Citrate - drink 8 ounces at noon the day before surgery   Fleet enema - administer the night before surgery   Oral Hygiene is also important to reduce your risk of infection.                                    Remember - BRUSH YOUR TEETH THE MORNING OF SURGERY WITH YOUR REGULAR TOOTHPASTE   Do NOT smoke after Midnight   Take these medicines the morning of surgery with A SIP OF WATER:   Clonidine  Stop all vitamins and herbal supplements 7 days before surgery  Bring CPAP mask and tubing day of surgery.                              You may not have any metal on your body including  jewelry, and body piercing             Do not wear  lotions,  powders, cologne, or deodorant              Men may shave face and neck.   Do not bring valuables to the hospital. [facility] IS NOT RESPONSIBLE   FOR VALUABLES.   Contacts, dentures or bridgework may not be worn into surgery.   Bring small overnight bag day of surgery.   DO NOT BRING YOUR HOME MEDICATIONS TO THE HOSPITAL. PHARMACY WILL DISPENSE MEDICATIONS LISTED ON YOUR MEDICATION LIST TO YOU DURING YOUR ADMISSION IN THE HOSPITAL!    Special Instructions: Bring a copy of your healthcare power of attorney and living will documents the day of surgery if you haven't scanned them before.              Please read over the following fact sheets you were given: IF YOU HAVE QUESTIONS ABOUT YOUR PRE-OP INSTRUCTIONS PLEASE CALL [phone] Thomas Huerta  If you received a COVID test during your pre-op visit  it is requested that you  wear a mask when out in public, stay away from anyone that may not be feeling well and notify your surgeon if you develop symptoms. If you test positive for Covid or have been in contact with anyone that has tested positive in the last 10 days please notify you surgeon.  [facility] - Preparing for Surgery Before surgery, you can play an important role.  Because skin is not sterile, your skin needs to be as free of germs as possible.  You can reduce the number of germs on your skin by washing with CHG (chlorahexidine gluconate) soap before surgery.  CHG is an antiseptic cleaner which kills germs and bonds with the skin to continue killing germs even after washing. Please DO NOT use if you have an allergy to CHG or antibacterial soaps.  If your skin becomes reddened/irritated stop using the CHG and inform your nurse when you arrive at Short Stay. Do not shave (including legs and underarms) for at least 48 hours prior to the first CHG shower.  You may shave your face/neck.  Please follow these instructions carefully:  1.  Shower with CHG Soap the night before surgery and the   morning of surgery.  2.  If you choose to wash your hair, wash your hair first as usual with your normal  shampoo.  3.  After you shampoo, rinse your hair and body thoroughly to remove the shampoo.                             4.  Use CHG as you would any other liquid soap.  You can apply chg directly to the skin and wash.  Gently with a scrungie or clean washcloth.  5.  Apply the CHG Soap to your body ONLY FROM THE NECK DOWN.   Do   not use on face/ open                           Wound or open sores. Avoid contact with eyes, ears mouth and   genitals (private parts).                       Wash face,  Genitals (private parts) with your normal soap.             6.  Wash thoroughly, paying special attention to the area where your    surgery  will be performed.  7.  Thoroughly rinse your body with warm water from the neck down.  8.  DO NOT shower/wash with your normal soap after using and rinsing off the CHG Soap.                9.  Pat yourself dry with a clean towel.            10.  Wear clean pajamas.            11.  Place clean sheets on your bed the night of your first shower and do not  sleep with pets. Day of Surgery : Do not apply any lotions/deodorants the morning of surgery.  Please wear clean clothes to the hospital/surgery center.  FAILURE TO FOLLOW THESE INSTRUCTIONS MAY RESULT IN THE CANCELLATION OF YOUR SURGERY  PATIENT SIGNATURE_________________________________  NURSE SIGNATURE__________________________________  ________________________________________________________________________   WHAT IS A BLOOD TRANSFUSION? Blood Transfusion Information  A transfusion is the replacement of blood or some  of its parts. Blood is made up of multiple cells which provide different functions. Red blood cells carry oxygen and are used for blood loss replacement. White blood cells fight against infection. Platelets control bleeding. Plasma helps clot blood. Other blood products are available  for specialized needs, such as hemophilia or other clotting disorders. BEFORE THE TRANSFUSION  Who gives blood for transfusions?  Healthy volunteers who are fully evaluated to make sure their blood is safe. This is blood bank blood. Transfusion therapy is the safest it has ever been in the practice of medicine. Before blood is taken from a donor, a complete history is taken to make sure that person has no history of diseases nor engages in risky social behavior (examples are intravenous drug use or sexual activity with multiple partners). The donor's travel history is screened to minimize risk of transmitting infections, such as malaria. The donated blood is tested for signs of infectious diseases, such as HIV and hepatitis. The blood is then tested to be sure it is compatible with you in order to minimize the chance of a transfusion reaction. If you or a relative donates blood, this is often done in anticipation of surgery and is not appropriate for emergency situations. It takes many days to process the donated blood. RISKS AND COMPLICATIONS Although transfusion therapy is very safe and saves many lives, the main dangers of transfusion include:  Getting an infectious disease. Developing a transfusion reaction. This is an allergic reaction to something in the blood you were given. Every precaution is taken to prevent this. The decision to have a blood transfusion has been considered carefully by your caregiver before blood is given. Blood is not given unless the benefits outweigh the risks. AFTER THE TRANSFUSION Right after receiving a blood transfusion, you will usually feel much better and more energetic. This is especially true if your red blood cells have gotten low (anemic). The transfusion raises the level of the red blood cells which carry oxygen, and this usually causes an energy increase. The nurse administering the transfusion will monitor you carefully for complications. HOME CARE  INSTRUCTIONS  No special instructions are needed after a transfusion. You may find your energy is better. Speak with your caregiver about any limitations on activity for underlying diseases you may have. SEEK MEDICAL CARE IF:  Your condition is not improving after your transfusion. You develop redness or irritation at the intravenous (IV) site. SEEK IMMEDIATE MEDICAL CARE IF:  Any of the following symptoms occur over the next 12 hours: Shaking chills. You have a temperature by mouth above 102 F (38.9 C), not controlled by medicine. Chest, back, or muscle pain. People around you feel you are not acting correctly or are confused. Shortness of breath or difficulty breathing. Dizziness and fainting. You get a rash or develop hives. You have a decrease in urine output. Your urine turns a dark color or changes to pink, red, or brown. Any of the following symptoms occur over the next 10 days: You have a temperature by mouth above 102 F (38.9 C), not controlled by medicine. Shortness of breath. Weakness after normal activity. The white part of the eye turns yellow (jaundice). You have a decrease in the amount of urine or are urinating less often. Your urine turns a dark color or changes to pink, red, or brown. Document Released: 10/31/2000 Document Revised: 01/26/2012 Document Reviewed: 06/19/2008 [facility] Patient Information 2014 [place], [place].  _______________________________________________________________________

## 2023-12-18 ENCOUNTER — Encounter (HOSPITAL_COMMUNITY): Payer: Self-pay

## 2023-12-18 ENCOUNTER — Other Ambulatory Visit: Payer: Self-pay

## 2023-12-18 ENCOUNTER — Encounter (HOSPITAL_COMMUNITY)
Admission: RE | Admit: 2023-12-18 | Discharge: 2023-12-18 | Disposition: A | Discharge status: Home / Self Care | Payer: Medicare Other | Source: Ambulatory Visit | Attending: Urology | Admitting: Urology

## 2023-12-18 DIAGNOSIS — Z01812 Encounter for preprocedural laboratory examination: Secondary | ICD-10-CM | POA: Insufficient documentation

## 2023-12-18 HISTORY — DX: Malignant neoplasm of prostate: C61

## 2023-12-18 LAB — CBC
HCT: 37.1 % — ABNORMAL LOW (ref 39.0–52.0)
Hemoglobin: 11.8 g/dL — ABNORMAL LOW (ref 13.0–17.0)
MCH: 30.4 pg (ref 26.0–34.0)
MCHC: 31.8 g/dL (ref 30.0–36.0)
MCV: 95.6 fL (ref 80.0–100.0)
Platelets: 192 10*3/uL (ref 150–400)
RBC: 3.88 MIL/uL — ABNORMAL LOW (ref 4.22–5.81)
RDW: 17 % — ABNORMAL HIGH (ref 11.5–15.5)
WBC: 8.1 10*3/uL (ref 4.0–10.5)
nRBC: 0 % (ref 0.0–0.2)

## 2023-12-18 LAB — BASIC METABOLIC PANEL
Anion gap: 9 (ref 5–15)
BUN: 19 mg/dL (ref 8–23)
CO2: 23 mmol/L (ref 22–32)
Calcium: 8.8 mg/dL — ABNORMAL LOW (ref 8.9–10.3)
Chloride: 109 mmol/L (ref 98–111)
Creatinine, Ser: 1 mg/dL (ref 0.61–1.24)
GFR, Estimated: >60 mL/min (ref >60)
Glucose, Bld: 72 mg/dL (ref 70–99)
Potassium: 3.8 mmol/L (ref 3.5–5.1)
Sodium: 141 mmol/L (ref 135–145)

## 2023-12-18 NOTE — Progress Notes (Addendum)
COVID Vaccine received:  []  No [x]  Yes Date of any COVID positive Test in last 90 days: no PCP - Drusilla Kanner NP Cardiologist - n/a  Chest x-ray -  EKG -   Stress Test -  ECHO - 08/09/18 Epic Cardiac Cath -   Bowel Prep - []  No  [x]   Yes ______  Pacemaker / ICD device [x]  No []  Yes   Spinal Cord Stimulator:[x]  No []  Yes       History of Sleep Apnea? [x]  No []  Yes   CPAP used?- [x]  No []  Yes    Does the patient monitor blood sugar?          [x]  No []  Yes  []  N/A  Patient has: [x]  NO Hx DM   []  Pre-DM                 []  DM1  []   DM2 Does patient have a [employer] or Dexacom? []  No []  Yes   Fasting Blood Sugar Ranges-  Checks Blood Sugar _____ times a day  GLP1 agonist / usual dose - no GLP1 instructions:  SGLT-2 inhibitors / usual dose - no SGLT-2 instructions:   Blood Thinner / Instructions:no Aspirin Instructions:ASA 81 mg. Will stop taking 12/20/23 per MD instruction  Comments:   Activity level: Patient is able  to climb a flight of stairs without difficulty; [x]  No CP  [x]  No SOB, __   Patient can perform ADLs without assistance.   Anesthesia review: Factor 5 Leiden, PAD,PVD  Patient denies shortness of breath, fever, cough and chest pain at PAT appointment.  Patient verbalized understanding and agreement to the Pre-Surgical Instructions that were given to them at this PAT appointment. Patient was also educated of the need to review these PAT instructions again prior to his/her surgery.I reviewed the appropriate phone numbers to call if they have any and questions or concerns.

## 2023-12-23 NOTE — Anesthesia Preprocedure Evaluation (Addendum)
 Anesthesia Evaluation  Patient identified by MRN, date of birth, ID band Patient awake    Reviewed: Allergy & Precautions, H&P , NPO status , reviewed documented beta blocker date and time   History of Anesthesia Complications (+) history of anesthetic complications  Airway Mallampati: II  TM Distance: >3 FB     Dental  (+) Poor Dentition, Chipped, Missing, Loose, Dental Advisory Given Wide gap upper incisors:   Pulmonary Current Smoker and Patient abstained from smoking., former smoker   Pulmonary exam normal        Cardiovascular + Peripheral Vascular Disease  Normal cardiovascular exam     Neuro/Psych    GI/Hepatic   Endo/Other    Renal/GU      Musculoskeletal   Abdominal   Peds  Hematology   Anesthesia Other Findings Past Medical History: Back pain chronic No date: Complication of anesthesia     Comment:  over medicated with Fentanyl   Reproductive/Obstetrics                             Anesthesia Physical Anesthesia Plan  ASA: 3  Anesthesia Plan: General   Post-op Pain Management: Tylenol  PO (pre-op)*, Celebrex  PO (pre-op)*, Dilaudid  IV and Gabapentin  PO (pre-op)*   Induction: Intravenous  PONV Risk Score and Plan: 1 and Treatment may vary due to age or medical condition, Ondansetron , Dexamethasone  and Midazolam   Airway Management Planned: Oral ETT  Additional Equipment: None  Intra-op Plan:   Post-operative Plan: Extubation in OR  Informed Consent: I have reviewed the patients History and Physical, chart, labs and discussed the procedure including the risks, benefits and alternatives for the proposed anesthesia with the patient or authorized representative who has indicated his/her understanding and acceptance.     Dental Advisory Given  Plan Discussed with: CRNA  Anesthesia Plan Comments: (  )        Anesthesia Quick Evaluation
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
## 2023-12-24 ENCOUNTER — Encounter (HOSPITAL_COMMUNITY)
Admission: RE | Disposition: A | Discharge status: Home / Self Care | Payer: Self-pay | Source: Home / Self Care | Attending: Urology

## 2023-12-24 ENCOUNTER — Other Ambulatory Visit: Payer: Self-pay

## 2023-12-24 ENCOUNTER — Ambulatory Visit (HOSPITAL_COMMUNITY): Payer: Self-pay | Admitting: Physician Assistant

## 2023-12-24 ENCOUNTER — Observation Stay (HOSPITAL_COMMUNITY)
Admission: RE | Admit: 2023-12-24 | Discharge: 2023-12-25 | Disposition: A | Discharge status: Home / Self Care | Payer: Medicare Other | Attending: Urology | Admitting: Urology

## 2023-12-24 ENCOUNTER — Encounter (HOSPITAL_COMMUNITY): Payer: Self-pay | Admitting: Urology

## 2023-12-24 ENCOUNTER — Ambulatory Visit (HOSPITAL_BASED_OUTPATIENT_CLINIC_OR_DEPARTMENT_OTHER): Payer: Self-pay | Admitting: Certified Registered"

## 2023-12-24 DIAGNOSIS — C61 Malignant neoplasm of prostate: Secondary | ICD-10-CM

## 2023-12-24 DIAGNOSIS — Z86718 Personal history of other venous thrombosis and embolism: Secondary | ICD-10-CM | POA: Diagnosis not present

## 2023-12-24 DIAGNOSIS — Z79899 Other long term (current) drug therapy: Secondary | ICD-10-CM | POA: Insufficient documentation

## 2023-12-24 HISTORY — PX: LYMPHADENECTOMY: SHX5960

## 2023-12-24 HISTORY — PX: ROBOT ASSISTED LAPAROSCOPIC RADICAL PROSTATECTOMY: SHX5141

## 2023-12-24 LAB — TYPE AND SCREEN
ABO/RH(D): O POS
Antibody Screen: NEGATIVE

## 2023-12-24 LAB — HEMOGLOBIN AND HEMATOCRIT, BLOOD
HCT: 38.8 % — ABNORMAL LOW (ref 39.0–52.0)
Hemoglobin: 12.2 g/dL — ABNORMAL LOW (ref 13.0–17.0)

## 2023-12-24 SURGERY — XI ROBOTIC ASSISTED LAPAROSCOPIC RADICAL PROSTATECTOMY LEVEL 3
Anesthesia: General

## 2023-12-24 MED ORDER — TRIPLE ANTIBIOTIC 3.5-400-5000 EX OINT: 1.0000 | TOPICAL_OINTMENT | Freq: Three times a day (TID) | CUTANEOUS | Status: DC | PRN

## 2023-12-24 MED ORDER — CEFAZOLIN SODIUM-DEXTROSE 1-4 GM/50ML-% IV SOLN
1.0000 g | Freq: Three times a day (TID) | INTRAVENOUS | Status: AC
  Administered 2023-12-24: 1 g via INTRAVENOUS
  Filled 2023-12-24: qty 50

## 2023-12-24 MED ORDER — KETAMINE HCL 10 MG/ML IJ SOLN
INTRAMUSCULAR | Status: DC | PRN
  Administered 2023-12-24 (×3): 10 mg via INTRAVENOUS

## 2023-12-24 MED ORDER — FENTANYL CITRATE (PF) 100 MCG/2ML IJ SOLN
INTRAMUSCULAR | Status: AC
  Filled 2023-12-24: qty 2

## 2023-12-24 MED ORDER — LACTATED RINGERS IV SOLN: INTRAVENOUS | Status: DC

## 2023-12-24 MED ORDER — ONDANSETRON HCL 4 MG/2ML IJ SOLN
INTRAMUSCULAR | Status: DC | PRN
  Administered 2023-12-24: 4 mg via INTRAVENOUS

## 2023-12-24 MED ORDER — PROPOFOL 10 MG/ML IV BOLUS
INTRAVENOUS | Status: DC | PRN
  Administered 2023-12-24: 150 mg via INTRAVENOUS
  Administered 2023-12-24: 50 mg via INTRAVENOUS

## 2023-12-24 MED ORDER — KETAMINE HCL 50 MG/5ML IJ SOSY
PREFILLED_SYRINGE | INTRAMUSCULAR | Status: AC
  Filled 2023-12-24: qty 5

## 2023-12-24 MED ORDER — DEXAMETHASONE SODIUM PHOSPHATE 10 MG/ML IJ SOLN
INTRAMUSCULAR | Status: DC | PRN
  Administered 2023-12-24: 10 mg via INTRAVENOUS

## 2023-12-24 MED ORDER — CEFAZOLIN SODIUM-DEXTROSE 1-4 GM/50ML-% IV SOLN
INTRAVENOUS | Status: AC
  Administered 2023-12-24: 1 g via INTRAVENOUS
  Filled 2023-12-24: qty 50

## 2023-12-24 MED ORDER — PHENYLEPHRINE 80 MCG/ML (10ML) SYRINGE FOR IV PUSH (FOR BLOOD PRESSURE SUPPORT)
PREFILLED_SYRINGE | INTRAVENOUS | Status: DC | PRN
  Administered 2023-12-24: 200 ug via INTRAVENOUS

## 2023-12-24 MED ORDER — DIPHENHYDRAMINE HCL 12.5 MG/5ML PO ELIX: 12.5000 mg | ORAL_SOLUTION | Freq: Four times a day (QID) | ORAL | Status: DC | PRN

## 2023-12-24 MED ORDER — OXYCODONE HCL 5 MG PO TABS: 5.0000 mg | ORAL_TABLET | Freq: Once | ORAL | Status: AC | PRN

## 2023-12-24 MED ORDER — FENTANYL CITRATE PF 50 MCG/ML IJ SOSY
25.0000 ug | PREFILLED_SYRINGE | INTRAMUSCULAR | Status: DC | PRN
  Administered 2023-12-24 (×2): 50 ug via INTRAVENOUS

## 2023-12-24 MED ORDER — ORAL CARE MOUTH RINSE: 15.0000 mL | Freq: Once | OROMUCOSAL | Status: AC

## 2023-12-24 MED ORDER — CEFAZOLIN SODIUM-DEXTROSE 2-4 GM/100ML-% IV SOLN
2.0000 g | INTRAVENOUS | Status: AC
  Administered 2023-12-24: 2 g via INTRAVENOUS
  Filled 2023-12-24: qty 100

## 2023-12-24 MED ORDER — GABAPENTIN 300 MG PO CAPS
300.0000 mg | ORAL_CAPSULE | ORAL | Status: AC
  Administered 2023-12-24: 300 mg via ORAL
  Filled 2023-12-24: qty 1

## 2023-12-24 MED ORDER — ACETAMINOPHEN 325 MG PO TABS: 650.0000 mg | ORAL_TABLET | ORAL | Status: DC | PRN

## 2023-12-24 MED ORDER — ZOLPIDEM TARTRATE 5 MG PO TABS
5.0000 mg | ORAL_TABLET | Freq: Every evening | ORAL | Status: DC | PRN
  Filled 2023-12-24: qty 1

## 2023-12-24 MED ORDER — ONDANSETRON HCL 4 MG/2ML IJ SOLN: 4.0000 mg | INTRAMUSCULAR | Status: DC | PRN

## 2023-12-24 MED ORDER — DOCUSATE SODIUM 100 MG PO CAPS
100.0000 mg | ORAL_CAPSULE | Freq: Two times a day (BID) | ORAL | Status: DC
  Administered 2023-12-24 – 2023-12-25 (×2): 100 mg via ORAL
  Filled 2023-12-24 (×2): qty 1

## 2023-12-24 MED ORDER — SODIUM CHLORIDE 0.9 % IV BOLUS
1000.0000 mL | Freq: Once | INTRAVENOUS | Status: AC
  Administered 2023-12-24: 1000 mL via INTRAVENOUS

## 2023-12-24 MED ORDER — GLYCOPYRROLATE 0.2 MG/ML IJ SOLN
INTRAMUSCULAR | Status: DC | PRN
  Administered 2023-12-24 (×2): .1 mg via INTRAVENOUS

## 2023-12-24 MED ORDER — ACETAMINOPHEN 500 MG PO TABS
1000.0000 mg | ORAL_TABLET | Freq: Once | ORAL | Status: AC
  Administered 2023-12-24: 1000 mg via ORAL
  Filled 2023-12-24: qty 2

## 2023-12-24 MED ORDER — STERILE WATER FOR IRRIGATION IR SOLN
Status: DC | PRN
  Administered 2023-12-24: 1000 mL

## 2023-12-24 MED ORDER — OXYCODONE HCL 5 MG PO TABS
ORAL_TABLET | ORAL | Status: AC
  Administered 2023-12-24: 5 mg via ORAL
  Filled 2023-12-24: qty 1

## 2023-12-24 MED ORDER — FENTANYL CITRATE PF 50 MCG/ML IJ SOSY
PREFILLED_SYRINGE | INTRAMUSCULAR | Status: AC
  Administered 2023-12-24: 50 ug via INTRAVENOUS
  Filled 2023-12-24: qty 3

## 2023-12-24 MED ORDER — HYOSCYAMINE SULFATE 0.125 MG SL SUBL: 0.1250 mg | SUBLINGUAL_TABLET | Freq: Four times a day (QID) | SUBLINGUAL | Status: DC | PRN

## 2023-12-24 MED ORDER — KCL IN DEXTROSE-NACL 20-5-0.45 MEQ/L-%-% IV SOLN
INTRAVENOUS | Status: DC
  Filled 2023-12-24 (×3): qty 1000

## 2023-12-24 MED ORDER — ENOXAPARIN SODIUM 40 MG/0.4ML IJ SOSY
40.0000 mg | PREFILLED_SYRINGE | Freq: Every day | INTRAMUSCULAR | Status: DC
  Administered 2023-12-24: 40 mg via SUBCUTANEOUS
  Filled 2023-12-24: qty 0.4

## 2023-12-24 MED ORDER — KETOROLAC TROMETHAMINE 15 MG/ML IJ SOLN
INTRAMUSCULAR | Status: AC
  Administered 2023-12-24: 15 mg via INTRAVENOUS
  Filled 2023-12-24: qty 1

## 2023-12-24 MED ORDER — HYDRALAZINE HCL 20 MG/ML IJ SOLN
INTRAMUSCULAR | Status: DC | PRN
  Administered 2023-12-24 (×2): 1 mg via INTRAVENOUS

## 2023-12-24 MED ORDER — KETOROLAC TROMETHAMINE 15 MG/ML IJ SOLN
15.0000 mg | Freq: Four times a day (QID) | INTRAMUSCULAR | Status: DC
  Administered 2023-12-24 – 2023-12-25 (×3): 15 mg via INTRAVENOUS
  Filled 2023-12-24 (×3): qty 1

## 2023-12-24 MED ORDER — OXYCODONE HCL 5 MG/5ML PO SOLN: 5.0000 mg | Freq: Once | ORAL | Status: AC | PRN

## 2023-12-24 MED ORDER — LIDOCAINE HCL (PF) 2 % IJ SOLN
INTRAMUSCULAR | Status: AC
  Filled 2023-12-24: qty 5

## 2023-12-24 MED ORDER — HYDRALAZINE HCL 20 MG/ML IJ SOLN
INTRAMUSCULAR | Status: AC
  Filled 2023-12-24: qty 1

## 2023-12-24 MED ORDER — DEXAMETHASONE SODIUM PHOSPHATE 10 MG/ML IJ SOLN
INTRAMUSCULAR | Status: AC
  Filled 2023-12-24: qty 1

## 2023-12-24 MED ORDER — BUPIVACAINE-EPINEPHRINE 0.25% -1:200000 IJ SOLN
INTRAMUSCULAR | Status: AC
  Filled 2023-12-24: qty 1

## 2023-12-24 MED ORDER — CLONIDINE HCL 0.1 MG PO TABS
0.1000 mg | ORAL_TABLET | Freq: Every day | ORAL | Status: DC
  Administered 2023-12-24 – 2023-12-25 (×2): 0.1 mg via ORAL
  Filled 2023-12-24 (×2): qty 1

## 2023-12-24 MED ORDER — PROPOFOL 10 MG/ML IV BOLUS
INTRAVENOUS | Status: AC
  Filled 2023-12-24: qty 20

## 2023-12-24 MED ORDER — ONDANSETRON HCL 4 MG/2ML IJ SOLN
INTRAMUSCULAR | Status: AC
  Filled 2023-12-24: qty 2

## 2023-12-24 MED ORDER — CELECOXIB 200 MG PO CAPS
200.0000 mg | ORAL_CAPSULE | Freq: Once | ORAL | Status: AC
  Administered 2023-12-24: 200 mg via ORAL
  Filled 2023-12-24: qty 1

## 2023-12-24 MED ORDER — ENOXAPARIN SODIUM 40 MG/0.4ML IJ SOSY: 40.0000 mg | PREFILLED_SYRINGE | INTRAMUSCULAR | 0 refills | Status: DC

## 2023-12-24 MED ORDER — GLYCOPYRROLATE 0.2 MG/ML IJ SOLN
INTRAMUSCULAR | Status: AC
  Filled 2023-12-24: qty 1

## 2023-12-24 MED ORDER — MIDAZOLAM HCL 2 MG/2ML IJ SOLN
INTRAMUSCULAR | Status: DC | PRN
  Administered 2023-12-24: 2 mg via INTRAVENOUS

## 2023-12-24 MED ORDER — ONDANSETRON HCL 4 MG/2ML IJ SOLN: 4.0000 mg | Freq: Once | INTRAMUSCULAR | Status: DC | PRN

## 2023-12-24 MED ORDER — ROCURONIUM BROMIDE 10 MG/ML (PF) SYRINGE
PREFILLED_SYRINGE | INTRAVENOUS | Status: AC
  Filled 2023-12-24: qty 10

## 2023-12-24 MED ORDER — HYDROMORPHONE HCL 1 MG/ML IJ SOLN
0.5000 mg | INTRAMUSCULAR | Status: DC | PRN
  Administered 2023-12-24 – 2023-12-25 (×3): 1 mg via INTRAVENOUS
  Filled 2023-12-24 (×3): qty 1

## 2023-12-24 MED ORDER — ACETAMINOPHEN 325 MG PO TABS: 325.0000 mg | ORAL_TABLET | ORAL | Status: DC | PRN

## 2023-12-24 MED ORDER — FLEET ENEMA RE ENEM: 1.0000 | ENEMA | Freq: Once | RECTAL | Status: DC

## 2023-12-24 MED ORDER — CHLORHEXIDINE GLUCONATE CLOTH 2 % EX PADS
6.0000 | MEDICATED_PAD | Freq: Every day | CUTANEOUS | Status: DC
  Administered 2023-12-24: 6 via TOPICAL

## 2023-12-24 MED ORDER — DOCUSATE SODIUM 100 MG PO CAPS: 100.0000 mg | ORAL_CAPSULE | Freq: Two times a day (BID) | ORAL | Status: DC

## 2023-12-24 MED ORDER — MIDAZOLAM HCL 2 MG/2ML IJ SOLN
INTRAMUSCULAR | Status: AC
Start: 2023-12-24 — End: ?
  Filled 2023-12-24: qty 2

## 2023-12-24 MED ORDER — CHLORHEXIDINE GLUCONATE 0.12 % MT SOLN
15.0000 mL | Freq: Once | OROMUCOSAL | Status: AC
  Administered 2023-12-24: 15 mL via OROMUCOSAL

## 2023-12-24 MED ORDER — SULFAMETHOXAZOLE-TRIMETHOPRIM 800-160 MG PO TABS: 1.0000 | ORAL_TABLET | Freq: Two times a day (BID) | ORAL | 0 refills | Status: DC

## 2023-12-24 MED ORDER — DIPHENHYDRAMINE HCL 50 MG/ML IJ SOLN: 12.5000 mg | Freq: Four times a day (QID) | INTRAMUSCULAR | Status: DC | PRN

## 2023-12-24 MED ORDER — ROCURONIUM BROMIDE 10 MG/ML (PF) SYRINGE
PREFILLED_SYRINGE | INTRAVENOUS | Status: DC | PRN
  Administered 2023-12-24: 10 mg via INTRAVENOUS
  Administered 2023-12-24: 60 mg via INTRAVENOUS
  Administered 2023-12-24 (×2): 20 mg via INTRAVENOUS

## 2023-12-24 MED ORDER — BUPIVACAINE-EPINEPHRINE 0.25% -1:200000 IJ SOLN
INTRAMUSCULAR | Status: DC | PRN
  Administered 2023-12-24: 30 mL

## 2023-12-24 MED ORDER — EPHEDRINE SULFATE-NACL 50-0.9 MG/10ML-% IV SOSY
PREFILLED_SYRINGE | INTRAVENOUS | Status: DC | PRN
  Administered 2023-12-24 (×6): 2.5 mg via INTRAVENOUS

## 2023-12-24 MED ORDER — FENTANYL CITRATE (PF) 100 MCG/2ML IJ SOLN
INTRAMUSCULAR | Status: DC | PRN
  Administered 2023-12-24: 50 ug via INTRAVENOUS

## 2023-12-24 MED ORDER — LIDOCAINE 2% (20 MG/ML) 5 ML SYRINGE
INTRAMUSCULAR | Status: DC | PRN
  Administered 2023-12-24: 40 mg via INTRAVENOUS

## 2023-12-24 MED ORDER — MEPERIDINE HCL 50 MG/ML IJ SOLN: 6.2500 mg | INTRAMUSCULAR | Status: DC | PRN

## 2023-12-24 MED ORDER — ACETAMINOPHEN 160 MG/5ML PO SOLN: 325.0000 mg | ORAL | Status: DC | PRN

## 2023-12-24 MED ORDER — MAGNESIUM CITRATE PO SOLN: 1.0000 | Freq: Once | ORAL | Status: DC

## 2023-12-24 SURGICAL SUPPLY — 59 items
APPLICATOR COTTON TIP 6 STRL (MISCELLANEOUS) ×2 IMPLANT
APPLICATOR COTTON TIP 6IN STRL (MISCELLANEOUS) ×2
BAG COUNTER SPONGE SURGICOUNT (BAG) IMPLANT
CATH FOLEY 2WAY SLVR 18FR 30CC (CATHETERS) ×2 IMPLANT
CATH ROBINSON RED A/P 16FR (CATHETERS) ×2 IMPLANT
CATH ROBINSON RED A/P 8FR (CATHETERS) ×2 IMPLANT
CATH TIEMANN FOLEY 18FR 5CC (CATHETERS) ×2 IMPLANT
CHLORAPREP W/TINT 26 (MISCELLANEOUS) ×2 IMPLANT
CLIP LIGATING HEM O LOK PURPLE (MISCELLANEOUS) ×2 IMPLANT
COVER SURGICAL LIGHT HANDLE (MISCELLANEOUS) ×2 IMPLANT
COVER TIP SHEARS 8 DVNC (MISCELLANEOUS) ×2 IMPLANT
CUTTER ECHEON FLEX ENDO 45 340 (ENDOMECHANICALS) ×2 IMPLANT
DERMABOND ADVANCED .7 DNX12 (GAUZE/BANDAGES/DRESSINGS) ×2 IMPLANT
DRAIN CHANNEL RND F F (WOUND CARE) IMPLANT
DRAPE ARM DVNC X/XI (DISPOSABLE) ×8 IMPLANT
DRAPE COLUMN DVNC XI (DISPOSABLE) ×2 IMPLANT
DRAPE SURG IRRIG POUCH 19X23 (DRAPES) ×2 IMPLANT
DRIVER NDL LRG 8 DVNC XI (INSTRUMENTS) ×4 IMPLANT
DRIVER NDLE LRG 8 DVNC XI (INSTRUMENTS) ×4
DRSG TEGADERM 4X4.75 (GAUZE/BANDAGES/DRESSINGS) ×2 IMPLANT
ELECT PENCIL ROCKER SW 15FT (MISCELLANEOUS) ×2 IMPLANT
ELECT REM PT RETURN 15FT ADLT (MISCELLANEOUS) ×2 IMPLANT
FORCEPS BPLR LNG DVNC XI (INSTRUMENTS) ×2 IMPLANT
FORCEPS PROGRASP DVNC XI (FORCEP) ×2 IMPLANT
GAUZE SPONGE 4X4 12PLY STRL (GAUZE/BANDAGES/DRESSINGS) ×2 IMPLANT
GLOVE BIO SURGEON STRL SZ 6.5 (GLOVE) ×2 IMPLANT
GLOVE SURG LX STRL 7.5 STRW (GLOVE) ×4 IMPLANT
GOWN STRL REUS W/ TWL XL LVL3 (GOWN DISPOSABLE) ×4 IMPLANT
GOWN STRL SURGICAL XL XLNG (GOWN DISPOSABLE) ×2 IMPLANT
HOLDER FOLEY CATH W/STRAP (MISCELLANEOUS) ×2 IMPLANT
IRRIG SUCT STRYKERFLOW 2 WTIP (MISCELLANEOUS) ×2
IRRIGATION SUCT STRKRFLW 2 WTP (MISCELLANEOUS) ×2 IMPLANT
IV LACTATED RINGERS 1000ML (IV SOLUTION) ×2 IMPLANT
KIT TURNOVER KIT A (KITS) IMPLANT
NDL SAFETY ECLIPSE 18X1.5 (NEEDLE) IMPLANT
PACK ROBOT UROLOGY CUSTOM (CUSTOM PROCEDURE TRAY) ×2 IMPLANT
PLUG CATH AND CAP STRL 200 (CATHETERS) ×2 IMPLANT
RELOAD STAPLE 45 4.1 GRN THCK (STAPLE) ×2 IMPLANT
SCISSORS MNPLR CVD DVNC XI (INSTRUMENTS) ×2 IMPLANT
SEAL UNIV 5-12 XI (MISCELLANEOUS) ×8 IMPLANT
SET CYSTO W/LG BORE CLAMP LF (SET/KITS/TRAYS/PACK) IMPLANT
SET TUBE SMOKE EVAC HIGH FLOW (TUBING) ×2 IMPLANT
SOL ELECTROSURG ANTI STICK (MISCELLANEOUS) ×2
SOL PREP POV-IOD 4OZ 10% (MISCELLANEOUS) ×2 IMPLANT
SOLUTION ELECTROSURG ANTI STCK (MISCELLANEOUS) ×2 IMPLANT
SPIKE FLUID TRANSFER (MISCELLANEOUS) ×2 IMPLANT
STAPLE RELOAD 45 GRN (STAPLE) ×2
SUT ETHILON 3 0 PS 1 (SUTURE) ×2 IMPLANT
SUT MNCRL 3 0 RB1 (SUTURE) ×2 IMPLANT
SUT MNCRL 3 0 VIOLET RB1 (SUTURE) ×2 IMPLANT
SUT MNCRL AB 4-0 PS2 18 (SUTURE) ×4 IMPLANT
SUT PDS PLUS AB 0 CT-2 (SUTURE) ×4 IMPLANT
SUT VIC AB 0 CT1 27XBRD ANTBC (SUTURE) ×4 IMPLANT
SUT VIC AB 2-0 SH 27X BRD (SUTURE) ×2 IMPLANT
SUT VIC AB 3-0 SH 27X BRD (SUTURE) IMPLANT
SUT VIC AB 3-0 SH 27XBRD (SUTURE) IMPLANT
SYR 27GX1/2 1ML LL SAFETY (SYRINGE) ×2 IMPLANT
TROCAR Z THREAD OPTICAL 12X100 (TROCAR) IMPLANT
WATER STERILE IRR 1000ML POUR (IV SOLUTION) ×2 IMPLANT

## 2023-12-24 NOTE — Anesthesia Procedure Notes (Addendum)
 Procedure Name: Intubation Date/Time: 12/24/2023 7:34 AM  Performed by: Metta Andrea NOVAK, CRNAPre-anesthesia Checklist: Patient identified, Emergency Drugs available, Suction available and Patient being monitored Patient Re-evaluated:Patient Re-evaluated prior to induction Oxygen Delivery Method: Circle system utilized Preoxygenation: Pre-oxygenation with 100% oxygen Induction Type: IV induction Ventilation: Mask ventilation without difficulty and Oral airway inserted - appropriate to patient size Laryngoscope Size: Mac and 4 Grade View: Grade I Tube type: Oral Tube size: 7.5 mm Number of attempts: 1 Airway Equipment and Method: Stylet and Oral airway Placement Confirmation: ETT inserted through vocal cords under direct vision, positive ETCO2 and breath sounds checked- equal and bilateral Secured at: 24 cm Tube secured with: Tape Dental Injury: Teeth and Oropharynx as per pre-operative assessment

## 2023-12-24 NOTE — Op Note (Signed)
 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 M.D.  Assistant(s): Alan Hammonds, PA-C  An assistant was required for this surgical procedure.  The duties of the assistant included but were not limited to suctioning, passing suture, camera manipulation, retraction. This procedure would not be able to be performed without an geophysicist/field seismologist.   Resident: Dr. Rutha Decamp  Anesthesia: General  Complications: None  EBL: 50 mL  IVF:  1000 mL crystalloid  Specimens: Prostate and seminal vesicles Right pelvic lymph nodes Left pelvic lymph nodes  Disposition of specimens: Pathology  Drains: 20 Fr coude catheter # 19 Blake pelvic drain  Indication: Thomas Huerta is a 68 y.o. patient with clinically localized prostate cancer.  After a thorough review of the management options for treatment of prostate cancer, he elected to proceed with surgical therapy and the above procedure(s).  We have discussed the potential benefits and risks of the procedure, side effects of the proposed treatment, the likelihood of the patient achieving the goals of the procedure, and any potential problems that might occur during the procedure or recuperation. Informed consent has been obtained.  Description of procedure:  The patient was taken to the operating room and a general anesthetic was administered. He was given preoperative antibiotics, placed in the dorsal lithotomy position, and prepped and draped in the usual sterile fashion. Next a preoperative timeout was performed. A urethral catheter was placed into the bladder and a site was selected near the umbilicus for  placement of the camera port. This was placed using a standard open Hassan technique which allowed entry into the peritoneal cavity under direct vision and without difficulty. An 8 mm port was placed and a pneumoperitoneum established. The camera was then used to inspect the abdomen and there was no evidence of any intra-abdominal injuries or other abnormalities. The remaining abdominal ports were then placed. 8 mm robotic ports were placed in the right lower quadrant, left lower quadrant, and far left lateral abdominal wall. A 5 mm port was placed in the right upper quadrant and a 12 mm port was placed in the right lateral abdominal wall for laparoscopic assistance. All ports were placed under direct vision without difficulty. The surgical cart was then docked.   Utilizing the cautery scissors, the bladder was reflected posteriorly allowing entry into the space of Retzius and identification of the endopelvic fascia and prostate. The periprostatic fat was then removed from the prostate allowing full exposure of the endopelvic fascia. The endopelvic fascia was then incised from the apex back to the base of the prostate bilaterally and the underlying levator muscle fibers were swept laterally off the prostate thereby isolating the dorsal venous complex. The dorsal vein was then stapled and divided with a 45 mm Flex Echelon stapler. Attention then turned to the bladder neck which was divided anteriorly thereby allowing entry into the bladder and exposure of the urethral catheter. The catheter balloon was deflated and the catheter was brought into the operative field and used to retract the prostate anteriorly. The posterior bladder neck was then examined and was divided allowing further dissection between the bladder and prostate posteriorly until the vasa deferentia and seminal vessels were identified. The vasa deferentia were isolated, divided, and lifted anteriorly. The seminal vesicles were  dissected down to  their tips with care to control the seminal vascular arterial blood supply. These structures were then lifted anteriorly and the space between Denonvillier's fascia and the anterior rectum was developed with a combination of sharp and blunt dissection. This isolated the vascular pedicles of the prostate.  The lateral prostatic fascia on the left side of the prostate was then sharply incised allowing release of the neurovascular bundle. The vascular pedicle of the prostate on the left side was then ligated with Weck clips between the prostate and neurovascular bundle and divided with sharp cold scissor dissection resulting in neurovascular bundle preservation. On the right side, a wide non nerve sparing dissection was performed with Weck clips used to ligate the vascular pedicle of the prostate. The neurovascular bundle on the left side was then separated off the apex of the prostate and urethra.   The urethra was then sharply transected allowing the prostate specimen to be disarticulated. The pelvis was copiously irrigated and hemostasis was ensured. There was no evidence for rectal injury.  Attention then turned to the right pelvic sidewall. The fibrofatty tissue between the external iliac vein, confluence of the iliac vessels, hypogastric artery, and Cooper's ligament was dissected free from the pelvic sidewall with care to preserve the obturator nerve. Weck clips were used for lymphostasis and hemostasis. An identical procedure was performed on the contralateral side and the lymphatic packets were removed for permanent pathologic analysis.  Attention then turned to the urethral anastomosis. A 2-0 Vicryl slip knot was placed between Denonvillier's fascia, the posterior bladder neck, and the posterior urethra to reapproximate these structures. A double-armed 3-0 Monocryl suture was then used to perform a 360 running tension-free anastomosis between the bladder neck and urethra. A new urethral catheter was  then placed into the bladder and irrigated. There were no blood clots within the bladder and the anastomosis appeared to be watertight. A #19 Blake drain was then brought through the left lateral 8 mm port site and positioned appropriately within the pelvis. It was secured to the skin with a nylon suture. The surgical cart was then undocked. The right lateral 12 mm port site was closed at the fascial level with a 0 Vicryl suture placed laparoscopically. All remaining ports were then removed under direct vision. The prostate specimen was removed intact within the Endopouch retrieval bag via the periumbilical camera port site. This fascial opening was closed with two running 0 PDS sutures. 0.25% Marcaine  was then injected into all port sites and all incisions were reapproximated at the skin level with 4-0 Monocryl subcuticular sutures and Dermabond. The patient appeared to tolerate the procedure well and without complications. The patient was able to be extubated and transferred to the recovery unit in satisfactory condition.   Gretel CANDIE Renda Teddie MD

## 2023-12-24 NOTE — Anesthesia Postprocedure Evaluation (Signed)
 Anesthesia Post Note  Patient: [name]  Procedure(s) Performed: XI ROBOTIC ASSISTED LAPAROSCOPIC RADICAL PROSTATECTOMY LEVEL 3 BILATERAL PELVIC LYMPHADENECTOMY (Bilateral)     Patient location during evaluation: PACU Anesthesia Type: General Level of consciousness: awake and alert Pain management: pain level controlled Vital Signs Assessment: post-procedure vital signs reviewed and stable Respiratory status: spontaneous breathing, nonlabored ventilation, respiratory function stable and patient connected to nasal cannula oxygen Cardiovascular status: blood pressure returned to baseline and stable Postop Assessment: no apparent nausea or vomiting Anesthetic complications: no   No notable events documented.  Last Vitals:  Vitals:   12/24/23 1200 12/24/23 1215  BP: (!) 150/83 (!) 145/81  Pulse: (!) 51 (!) 51  Resp: 11 12  Temp:    SpO2: 97% 98%    Last Pain:  Vitals:   12/24/23 1221  TempSrc:   PainSc: Asleep                 Apolinar Bero

## 2023-12-24 NOTE — Discharge Instructions (Addendum)
 Activity:  You are encouraged to ambulate frequently (about every hour during waking hours) to help prevent blood clots from forming in your legs or lungs.  However, you should not engage in any heavy lifting (> 10-15 lbs), strenuous activity, or straining. Diet: You should continue a clear liquid diet until passing gas from below.  Once this occurs, you may advance your diet to a soft diet that would be easy to digest (i.e soups, scrambled eggs, mashed potatoes, etc.) for 24 hours just as you would if getting over a bad stomach flu.  If tolerating this diet well for 24 hours, you may then begin eating regular food.  It will be normal to have some amount of bloating, nausea, and abdominal discomfort intermittently. Prescriptions:  You will be provided a prescription for pain medication to take as needed.  If your pain is not severe enough to require the prescription pain medication, you may take Tylenol  instead.  You should also take an over the counter stool softener (Colace 100 mg twice daily) to avoid straining with bowel movements as the pain medication may constipate you. Finally, you will also be provided a prescription for an antibiotic to begin the day prior to your return visit in the office for catheter removal. Catheter care: You will be taught how to take care of the catheter by the nursing staff prior to discharge from the hospital.  You may use both a leg bag and the larger bedside bag but it is recommended to at least use the bigger bedside bag at nighttime as the leg bag is small and will fill up overnight and also does not drain as well when lying flat. You may periodically feel a strong urge to void with the catheter in place.  This is a bladder spasm and most often can occur when having a bowel movement or when you are moving around. It is typically self-limited and usually will stop after a few minutes.  You may use some Vaseline or Neosporin around the tip of the catheter to reduce friction  at the tip of the penis. You have been sent home with ditropan  - an anti-spasm medication. This medication can have side effects, including dry mouth, constipation. Please be wary of these side effects should you take this medication and be sure to use your stool softener and stay well hydrated.  Incisions: You may remove your dressing bandages the 2nd day after surgery.  You most likely will have a few small staples in each of the incisions and once the bandages are removed, the incisions may stay open to air.  You may start showering (not soaking or bathing in water ) 48 hours after surgery and the incisions simply need to be patted dry after the shower.  No additional care is needed. What to call us  about: You should call the office [phone]) if you develop fever > 101, persistent vomiting, or the catheter stops draining. Also, feel free to call with any other questions you may have and remember the handout that was provided to you as a reference preoperatively which answers many of the common questions that arise after surgery. Continue taking the lovenox  instructions for 1 week. At the 1 week mark, you should STOP your lovenox  injections and start the blood thinner, xarelto  - 10 mg twice daily. You should also contact your vascular surgeon and schedule a close follow-up appointment with them in the next couple of weeks. Dr. Renda has talked to Dr. Gretta, and they should  have an appointment space for you to set up once you call.

## 2023-12-24 NOTE — Transfer of Care (Signed)
 Immediate Anesthesia Transfer of Care Note  Patient: Thomas Huerta  Procedure(s) Performed: XI ROBOTIC ASSISTED LAPAROSCOPIC RADICAL PROSTATECTOMY LEVEL 3 BILATERAL PELVIC LYMPHADENECTOMY (Bilateral)  Patient Location: PACU  Anesthesia Type:General  Level of Consciousness: drowsy  Airway & Oxygen Therapy: Patient Spontanous Breathing and Patient connected to face mask oxygen  Post-op Assessment: Report given to RN and Post -op Vital signs reviewed and stable  Post vital signs: Reviewed and stable  Last Vitals:  Vitals Value Taken Time  BP    Temp    Pulse 54 12/24/23 1056  Resp    SpO2 100 % 12/24/23 1056  Vitals shown include unfiled device data.  Last Pain:  Vitals:   12/24/23 0603  TempSrc: Oral  PainSc:          Complications: No notable events documented.

## 2023-12-24 NOTE — Interval H&P Note (Signed)
 History and Physical Interval Note:  12/24/2023 6:34 AM  Thomas Huerta  has presented today for surgery, with the diagnosis of PROSTATE CANCER.  The various methods of treatment have been discussed with the patient and family. After consideration of risks, benefits and other options for treatment, the patient has consented to  Procedure(s) with comments: XI ROBOTIC ASSISTED LAPAROSCOPIC RADICAL PROSTATECTOMY LEVEL 3 (N/A) - 210 MINUTES NEEDED FOR CASE BILATERAL PELVIC LYMPHADENECTOMY (Bilateral) as a surgical intervention.  The patient's history has been reviewed, patient examined, no change in status, stable for surgery.  I have reviewed the patient's chart and labs.  Questions were answered to the patient's satisfaction.     [employer]

## 2023-12-24 NOTE — Progress Notes (Signed)
 Post-op note  Subjective: The patient is doing well.  No complaints.  Objective: Vital signs in last 24 hours: Temp:  [97.5 F (36.4 C)-98 F (36.7 C)] 97.7 F (36.5 C) (02/06 1643) Pulse Rate:  [40-57] 54 (02/06 1643) Resp:  [4-20] 20 (02/06 1643) BP: (130-165)/(79-100) 130/88 (02/06 1643) SpO2:  [93 %-100 %] 98 % (02/06 1643) Weight:  [72.6 kg] 72.6 kg (02/06 0555)  Intake/Output from previous day: No intake/output data recorded. Intake/Output this shift: Total I/O In: 1100 [I.V.:1000; IV Piggyback:100] Out: 910 [Urine:800; Drains:60; Blood:50]  Physical Exam:  General: Alert and oriented. Abdomen: Soft, Nondistended. Incisions: Clean and dry.  Lab Results: Recent Labs    12/24/23 1117  HGB 12.2*  HCT 38.8*    Assessment/Plan: POD#0   1) Continue to monitor  Lynwood MICAEL Decamp, MD Resident [facility]ician [facility]   LOS: 0 days   Lynwood Decamp 12/24/2023, 5:03 PM

## 2023-12-25 ENCOUNTER — Encounter (HOSPITAL_COMMUNITY): Payer: Self-pay | Admitting: Urology

## 2023-12-25 DIAGNOSIS — C61 Malignant neoplasm of prostate: Secondary | ICD-10-CM | POA: Diagnosis not present

## 2023-12-25 LAB — ABO/RH: ABO/RH(D): O POS

## 2023-12-25 LAB — HEMOGLOBIN AND HEMATOCRIT, BLOOD
HCT: 33.1 % — ABNORMAL LOW (ref 39.0–52.0)
Hemoglobin: 10.5 g/dL — ABNORMAL LOW (ref 13.0–17.0)

## 2023-12-25 MED ORDER — ORAL CARE MOUTH RINSE: 15.0000 mL | OROMUCOSAL | Status: DC | PRN

## 2023-12-25 MED ORDER — ACETAMINOPHEN 500 MG PO TABS: 1000.0000 mg | ORAL_TABLET | Freq: Four times a day (QID) | ORAL | 0 refills | Status: AC

## 2023-12-25 MED ORDER — BISACODYL 10 MG RE SUPP
10.0000 mg | Freq: Once | RECTAL | Status: DC
  Filled 2023-12-25: qty 1

## 2023-12-25 MED ORDER — RIVAROXABAN 10 MG PO TABS
10.0000 mg | ORAL_TABLET | Freq: Every day | ORAL | 0 refills | Status: DC
Start: 2023-12-25 — End: 2024-09-12

## 2023-12-25 MED ORDER — LIDOCAINE HCL URETHRAL/MUCOSAL 2 % EX GEL
CUTANEOUS | Status: AC
  Filled 2023-12-25: qty 30

## 2023-12-25 MED ORDER — ACETAMINOPHEN 500 MG PO TABS
1000.0000 mg | ORAL_TABLET | Freq: Four times a day (QID) | ORAL | Status: DC
  Administered 2023-12-25: 1000 mg via ORAL
  Filled 2023-12-25: qty 2

## 2023-12-25 MED ORDER — BUPRENORPHINE HCL 8 MG SL SUBL
8.0000 mg | SUBLINGUAL_TABLET | Freq: Three times a day (TID) | SUBLINGUAL | Status: DC
  Administered 2023-12-25 (×2): 8 mg via SUBLINGUAL
  Filled 2023-12-25 (×2): qty 1

## 2023-12-25 MED ORDER — TRAMADOL HCL 50 MG PO TABS
50.0000 mg | ORAL_TABLET | Freq: Four times a day (QID) | ORAL | Status: DC | PRN
Start: 2023-12-25 — End: 2023-12-25

## 2023-12-25 MED ORDER — CELECOXIB 200 MG PO CAPS: 200.0000 mg | ORAL_CAPSULE | Freq: Two times a day (BID) | ORAL | 0 refills | Status: AC

## 2023-12-25 MED ORDER — OXYBUTYNIN CHLORIDE ER 5 MG PO TB24: 5.0000 mg | ORAL_TABLET | Freq: Three times a day (TID) | ORAL | 0 refills | Status: AC | PRN

## 2023-12-25 NOTE — Progress Notes (Signed)
 Mobility Specialist - Progress Note   12/25/23 1056  Mobility  Activity Ambulated with assistance in hallway  Level of Assistance Modified independent, requires aide device or extra time  Assistive Device Front wheel walker  Distance Ambulated (ft) 150 ft  Range of Motion/Exercises Active  Activity Response Tolerated well  Mobility Referral Yes  Mobility visit 1 Mobility  Mobility Specialist Start Time (ACUTE ONLY) 1046  Mobility Specialist Stop Time (ACUTE ONLY) 1056  Mobility Specialist Time Calculation (min) (ACUTE ONLY) 10 min   Pt was found in bed and agreeable to ambulate. No complaints with session. AT EOS returned to bed with all needs met. Call bell in reach.  Erminio Leos Mobility Specialist

## 2023-12-25 NOTE — Discharge Summary (Signed)
 Date of admission: 12/24/2023  Date of discharge: 12/25/2023  Admission diagnosis:  Prostate cancer [facility]) [C61]   Discharge diagnosis:  Prostate cancer (HCC) [C61]  Secondary diagnoses:   Active Ambulatory Problems    Diagnosis Date Noted   Leg DVT (deep venous thromboembolism), acute, left (HCC) 09/10/2018   Personal history of venous thrombosis and embolism 11/24/2018   Macrocytosis 11/24/2018   Hypoalbuminemia due to protein-calorie malnutrition (HCC) 11/24/2018   Ureteral stone 02/12/2014   Elevated alkaline phosphatase level 12/09/2018   Gastric bypass status for obesity    Moderate protein-calorie malnutrition (HCC)    Factor 5 Leiden mutation, heterozygous (HCC) 05/26/2019   Right leg DVT (HCC) 08/28/2020   Lumbago of lumbar region with sciatica 02/14/2021   Lumbar spondylosis 02/21/2021   Primary prostate adenocarcinoma (HCC) 10/23/2023   Resolved Ambulatory Problems    Diagnosis Date Noted   No Resolved Ambulatory Problems   Past Medical History:  Diagnosis Date   Back pain    Complication of anesthesia    History of kidney stones    Obesity    Peripheral arterial disease (HCC)    Peripheral vascular disease (HCC)    Prostate cancer (HCC)      History and Physical: For full details, please see admission history and physical. Briefly, Thomas Huerta is a 68 y.o. year old patient who was admitted with clinically localized GG 4+3 PCa.   Hospital Course: Pt admitted and underwent robotic prostatectomy on 12/24/2023. Their hospital course was unremarkable. By POD1, they were tolerating a regular diet, pain was controlled with oral medications, and they were deemed appropriate for discharge. They will continue their foley catheter until their scheduled follow-up appointment.    On the day of discharge, the patient was tolerating a regular diet and their pain was well controlled. They were determined to be stable for discharge home.  Given prior vascular history, the  pt was started on lovenox  40 mg daily ppx injections. They will go home on these injections for 1 week, and then start xarelto  10 mg BID (prescription sent for 30 days). They were instructed to contact vascular surgeon (Dr. Gretta) to set up close follow-up appointment to discuss long term anticoagulation and further evaluation.   Additionally, given history of chronic pain, pt was switched from Buprenorphine -Naloxine to Buprenorphine  only prior to surgery. He was discharged home with the following pain medications: scheduled tylenol  1000 mg q6 hours and Celebrex  200 mg BID, and his home Buprenorphine  (8 mg TID). He was not sent with additional narcotic medication, and this was discussed extensively with pt given low need for additional narcotics after surgery, risk of constipation, etc.   Laboratory values:  Recent Labs    12/24/23 1117 12/25/23 0400  HGB 12.2* 10.5*  HCT 38.8* 33.1*   No results for input(s): CREATININE in the last 72 hours.  Disposition: Home  Discharge medications:  Allergies as of 12/25/2023       Reactions   Fentanyl  Other (See Comments)   Sedated for days        Medication List     TAKE these medications    buprenorphine  8 MG Subl SL tablet Commonly known as: SUBUTEX  Place 8 mg under the tongue in the morning, at noon, and at bedtime.   Buprenorphine  HCl-Naloxone  HCl 12-3 MG Film Place 0.5 Film under the tongue in the morning, at noon, in the evening, and at bedtime.   celecoxib  200 MG capsule Commonly known as: CeleBREX  Take 1 capsule (200  mg total) by mouth 2 (two) times daily.   cloNIDine  0.1 MG tablet Commonly known as: CATAPRES  Take 0.1 mg by mouth daily.   docusate sodium  100 MG capsule Commonly known as: COLACE Take 1 capsule (100 mg total) by mouth 2 (two) times daily.   enoxaparin  40 MG/0.4ML injection Commonly known as: LOVENOX  Inject 0.4 mLs (40 mg total) into the skin daily.   multivitamin with minerals tablet Take 1 tablet by  mouth daily.   orphenadrine  100 MG tablet Commonly known as: NORFLEX  Take 1 tablet (100 mg total) by mouth 2 (two) times daily.   sulfamethoxazole -trimethoprim  800-160 MG tablet Commonly known as: BACTRIM  DS Take 1 tablet by mouth 2 (two) times daily. Start the day prior to foley removal appointment   VITAMIN D3 PO Take 1 tablet by mouth daily.        Followup:   Follow-up Information     Renda Glance, MD Follow up on 01/01/2024.   Specialty: Urology Why: at 8:30 Contact information: [address] [place] [address] [phone]

## 2023-12-25 NOTE — Progress Notes (Signed)
 1 Day Post-Op Subjective: The patient is doing well.  No nausea or vomiting. Pain is adequately controlled. Minimal JP output. Good PO intake, has ambulated.   Objective: Vital signs in last 24 hours: Temp:  [97.5 F (36.4 C)-98.1 F (36.7 C)] 97.7 F (36.5 C) (02/07 0410) Pulse Rate:  [42-57] 42 (02/07 0410) Resp:  [4-20] 14 (02/07 0410) BP: (127-164)/(67-100) 129/85 (02/07 0410) SpO2:  [93 %-100 %] 99 % (02/07 0410)  Intake/Output from previous day: 02/06 0701 - 02/07 0700 In: 3367.2 [P.O.:480; I.V.:2787.2; IV Piggyback:100] Out: 1710 [Urine:1550; Drains:110; Blood:50] Intake/Output this shift: No intake/output data recorded.  Physical Exam:  General: Alert and oriented. CV: RRR Lungs: Clear bilaterally. GI: Soft, Nondistended. Incisions: Clean, dry, and intact Urine: Clear Extremities: Nontender, no erythema, no edema.  Lab Results: Recent Labs    12/24/23 1117 12/25/23 0400  HGB 12.2* 10.5*  HCT 38.8* 33.1*      Assessment/Plan: POD# 1 s/p robotic prostatectomy.  1) SL IVF 2) Ambulate, Incentive spirometry 3) Transition to oral pain medication 4) Dulcolax suppository 5) D/C pelvic drain 6) Plan for likely discharge later today 7) To continue lovenox  30 days through discharge with close vascular follow-up  Thomas LELON Decamp, MD Resident Physician [facility]    LOS: 0 days   Thomas Huerta 12/25/2023, 7:12 AM

## 2023-12-25 NOTE — Progress Notes (Signed)
 AVS and discharge instructions reviewed w/ patient and son at the bedside. Patient provided w/ foley care education and extra supplies. Patient and son verbalized understanding.

## 2023-12-25 NOTE — Plan of Care (Signed)
  Problem: Education: Goal: Knowledge of the procedure and recovery process will improve Outcome: Progressing   Problem: Bowel/Gastric: Goal: Gastrointestinal status for postoperative course will improve Outcome: Progressing   Problem: Pain Management: Goal: General experience of comfort will improve Outcome: Progressing   Problem: Skin Integrity: Goal: Demonstration of wound healing without infection will improve Outcome: Progressing   Problem: Urinary Elimination: Goal: Ability to avoid or minimize complications of infection will improve Outcome: Progressing Goal: Ability to achieve and maintain urine output will improve Outcome: Progressing Goal: Home care management will improve Outcome: Progressing   Problem: Education: Goal: Knowledge of General Education information will improve Description: Including pain rating scale, medication(s)/side effects and non-pharmacologic comfort measures Outcome: Progressing   Problem: Health Behavior/Discharge Planning: Goal: Ability to manage health-related needs will improve Outcome: Progressing   Problem: Clinical Measurements: Goal: Ability to maintain clinical measurements within normal limits will improve Outcome: Progressing Goal: Will remain free from infection Outcome: Progressing Goal: Diagnostic test results will improve Outcome: Progressing Goal: Respiratory complications will improve Outcome: Progressing Goal: Cardiovascular complication will be avoided Outcome: Progressing   Problem: Activity: Goal: Risk for activity intolerance will decrease Outcome: Progressing   Problem: Nutrition: Goal: Adequate nutrition will be maintained Outcome: Progressing   Problem: Coping: Goal: Level of anxiety will decrease Outcome: Progressing   Problem: Elimination: Goal: Will not experience complications related to bowel motility Outcome: Progressing Goal: Will not experience complications related to urinary  retention Outcome: Progressing   Problem: Pain Managment: Goal: General experience of comfort will improve and/or be controlled Outcome: Progressing   Problem: Safety: Goal: Ability to remain free from injury will improve Outcome: Progressing   Problem: Skin Integrity: Goal: Risk for impaired skin integrity will decrease Outcome: Progressing  Pt has eaten two cups of broth and is feeling good.

## 2023-12-25 NOTE — Progress Notes (Signed)
 Pt use walker and walked in the hallway without assistance 80 feet. Pt was not dizzy or unsteady on feet.

## 2023-12-25 NOTE — Care Management Obs Status (Signed)
 MEDICARE OBSERVATION STATUS NOTIFICATION   Patient Details  Name: JENSEN KILBURG MRN: [id number] Date of Birth: May 17, 1956   Medicare Observation Status Notification Given:  Yes    Zenon Hilda, LCSW 12/25/2023, 9:49 AM

## 2023-12-25 NOTE — Progress Notes (Signed)
   12/25/23 1007  TOC Brief Assessment  Insurance and Status Reviewed  Patient has primary care physician Yes  Home environment has been reviewed Resides at home with family  Prior level of function: Independent with ADLs at baseline  Prior/Current Home Services No current home services  Social Drivers of Health Review SDOH reviewed no interventions necessary  Readmission risk has been reviewed Yes  Transition of care needs no transition of care needs at this time

## 2023-12-28 ENCOUNTER — Telehealth: Payer: Self-pay | Admitting: Vascular Surgery

## 2023-12-29 LAB — SURGICAL PATHOLOGY

## 2023-12-29 NOTE — Progress Notes (Signed)
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   Patient is scheduled for his post op appointment on 2/14.  No additional needs at this time.  Will continue to follow.

## 2023-12-30 NOTE — Telephone Encounter (Signed)
Pt appts scheduled.

## 2024-01-08 ENCOUNTER — Other Ambulatory Visit: Payer: Self-pay

## 2024-01-08 DIAGNOSIS — I82402 Acute embolism and thrombosis of unspecified deep veins of left lower extremity: Secondary | ICD-10-CM

## 2024-01-15 ENCOUNTER — Ambulatory Visit (HOSPITAL_COMMUNITY)
Admission: RE | Admit: 2024-01-15 | Discharge: 2024-01-15 | Disposition: A | Discharge status: Home / Self Care | Payer: Medicare Other | Source: Ambulatory Visit | Attending: Vascular Surgery | Admitting: Vascular Surgery

## 2024-01-15 DIAGNOSIS — I82402 Acute embolism and thrombosis of unspecified deep veins of left lower extremity: Secondary | ICD-10-CM | POA: Diagnosis present

## 2024-01-25 NOTE — Progress Notes (Unsigned)
 Patient name: Thomas Huerta MRN: [id number] DOB: 12/05/1955 Sex: male  REASON FOR CONSULT: Follow-up for iliac vein stents  HPI: Thomas Huerta is a 68 y.o. 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  Initially in 2019 he underwent tenolysis of the left leg DVT ultimately requiring left common and external iliac vein stent with a Vici.  He later had a right leg DVT in 2021 requiring percutaneous thrombectomy with a right common iliac vein Wallstent.  Past Medical History:  Diagnosis Date   Back pain    chronic   Complication of anesthesia    over medicated with Fentanyl   History of kidney stones    5th time treating stones in 15 years   Obesity    history of prior to gastric bypass   Peripheral arterial disease (HCC)    Peripheral vascular disease (HCC)    Prostate cancer [facility])     Past Surgical History:  Procedure Laterality Date   ANGIOPLASTY ILLIAC ARTERY Left 09/11/2018   Procedure: BALLOON ANGIOPLASTY LEFT ILIAC VEIN;  Surgeon: Cephus Shelling, MD;  Location: [facility] OR;  Service: Vascular;  Laterality: Left;   CORONARY ULTRASOUND/IVUS N/A 09/11/2018   Procedure: INTRAVASCULAR ULTRASOUND/IVUS;  Surgeon: Cephus Shelling, MD;  Location: [facility] OR;  Service: Vascular;  Laterality: N/A;   CYST EXCISION     left lower back beside the spine   CYSTOSCOPY     CYSTOSCOPY W/ URETERAL STENT REMOVAL Left 08/18/2017   Procedure: CYSTOSCOPY WITH STENT REMOVAL;  Surgeon: Orson Ape, MD;  Location: [facility] ORS;  Service: Urology;  Laterality: Left;   CYSTOSCOPY WITH STENT PLACEMENT Left 06/16/2017   Procedure: CYSTOSCOPY WITH STENT PLACEMENT;  Surgeon: Orson Ape, MD;  Location: [facility] ORS;  Service: Urology;  Laterality: Left;   ESOPHAGOGASTRODUODENOSCOPY (EGD) WITH PROPOFOL N/A 12/14/2018   Procedure: ESOPHAGOGASTRODUODENOSCOPY (EGD) WITH PROPOFOL;  Surgeon: Toney Reil, MD;   Location[facility]OSCOPY;  Service: Gastroenterology;  Laterality: N/A;   EXTRACORPOREAL SHOCK WAVE LITHOTRIPSY Left 06/25/2017   Procedure: EXTRACORPOREAL SHOCK WAVE LITHOTRIPSY (ESWL);  Surgeon: Orson Ape, MD;  Location: [facility] ORS;  Service: Urology;  Laterality: Left;   EXTRACORPOREAL SHOCK WAVE LITHOTRIPSY Left 08/06/2017   Procedure: EXTRACORPOREAL SHOCK WAVE LITHOTRIPSY (ESWL);  Surgeon: Orson Ape, MD;  Location: [facility] ORS;  Service: Urology;  Laterality: Left;   EXTRACORPOREAL SHOCK WAVE LITHOTRIPSY Right 11/05/2017   Procedure: EXTRACORPOREAL SHOCK WAVE LITHOTRIPSY (ESWL);  Surgeon: Orson Ape, MD;  Location: [facility] ORS;  Service: Urology;  Laterality: Right;   GASTRIC BYPASS  2004   HERNIA REPAIR Bilateral    inguinal hernias   INSERTION OF ILIAC STENT Left 09/11/2018   Procedure: INSERTION OF LEFT ILIAC STENT;  Surgeon: Cephus Shelling, MD;  Location: [facility] OR;  Service: Vascular;  Laterality: Left;   LOWER EXTREMITY VENOGRAPHY Left 09/10/2018   Procedure: LOWER EXTREMITY VENOGRAPHY;  Surgeon: Sherren Kerns, MD;  [facility]: [facility] INVAS[facility] CV [facility];  Service: Cardiovascular;  Laterality: Left;   LYMPHADENECTOMY Bilateral 12/24/2023   Procedure: BILATERAL PELVIC LYMPHADENECTOMY;  Surgeon: Heloise Purpura, MD;  Location: [facility] ORS;  Service: Urology;  Laterality: Bilateral;   PERIPHERAL VASCULAR INTERVENTION  07/25/2020   Procedure: PERIPHERAL VASCULAR INTERVENTION;  Surgeon: Cephus Shelling, MD;  [facility]: [facility];  Service: Cardiovascular;;  lower rt external and common femoral venous stent   PERIPHERAL VASCULAR THROMBECTOMY Left 09/10/2018  Procedure: PERIPHERAL VASCULAR THROMBECTOMY;  Surgeon: Sherren Kerns, MD;  Location: [facility] INVASIVE CV LAB;  Service: Cardiovascular;  Laterality: Left;  lower extremity venous system.   PERIPHERAL VASCULAR THROMBECTOMY Right 07/25/2020   Procedure: PERIPHERAL VASCULAR THROMBECTOMY;  Surgeon: Cephus Shelling, MD;  Location:  [facility] INVAS[facility] CV [facility];  Service: Cardiovascular;  Laterality: Right;   ROBOT ASSISTED LAPAROSCOPIC RADICAL PROSTATECTOMY N/A 12/24/2023   Procedure: XI ROBOTIC ASSISTED LAPAROSCOPIC RADICAL PROSTATECTOMY LEVEL 3;  Surgeon: Heloise Purpura, MD;  Location: [facility] ORS;  Service: Urology;  Laterality: N/A;  210 MINUTES NEEDED FOR CASE   TONSILLECTOMY     and addenoids   TONSILLECTOMY AND ADENOIDECTOMY     URETEROSCOPY WITH HOLMIUM LASER LITHOTRIPSY Left 06/16/2017   Procedure: URETEROSCOPY WITH HOLMIUM LASER LITHOTRIPSY;  Surgeon: Orson Ape, MD;  Location: [facility] ORS;  Service: Urology;  Laterality: Left;   VENOGRAM Left 09/11/2018   Procedure: VENOGRAM LEFT LEG;  Surgeon: Cephus Shelling, MD;  Location[facility] OR;  Service: Vascular;  Laterality: Left;    Family History  Problem Relation Age of Onset   Lymphoma Mother 42   Colon cancer Father 55   Cancer Brother 51       GI cancer   Lymphoma Maternal Aunt 50 - 79   Prostate cancer Paternal Uncle 5    SOCIAL HISTORY: Social History   Socioeconomic History   Marital status: Married    Spouse name: Not on file   Number of children: Not on file   Years of education: Not on file   Highest education level: Not on file  Occupational History   Not on file  Tobacco Use   Smoking status: Every Day    Current packs/day: 0.00    Types: Cigarettes    Last attempt to quit: 11/01/2018    Years since quitting: 5.2   Smokeless tobacco: Never  Vaping Use   Vaping status: Never Used  Substance and Sexual Activity   Alcohol use: No   Drug use: Yes    Types: Oxycodone    Comment: past history age 76 with perscription Percocet   Sexual activity: Not on file  Other Topics Concern   Not on file  Social History Narrative   Not on file   Social Drivers of Health   Financial Resource Strain: Low Risk  (01/31/2022)   Received from [facility]   Overall Financial Resource Strain (CARDIA)    Difficulty of Paying Living Expenses: Not hard at  all  Food Insecurity: No Food Insecurity (12/24/2023)   Hunger Vital Sign    Worried About Running Out of Food in the Last Year: Never true    Ran Out of Food in the Last Year: Never true  Transportation Needs: No Transportation Needs (12/24/2023)   PRAPARE - Administrator, Civil Service (Medical): No    Lack of Transportation (Non-Medical): No  Physical Activity: Not on file  Stress: Not on file  Social Connections: Moderately Isolated (12/24/2023)   Social Connection and Isolation Panel [NHANES]    Frequency of Communication with Friends and Family: More than three times a week    Frequency of Social Gatherings with Friends and Family: More than three times a week    Attends Religious Services: Never    Database administrator or Organizations: No    Attends Banker Meetings: Never    Marital Status: Living with partner  Intimate Partner Violence: Not At Risk (12/24/2023)   Humiliation,  Afraid, Rape, and Kick questionnaire    Fear of Current or Ex-Partner: No    Emotionally Abused: No    Physically Abused: No    Sexually Abused: No    Allergies  Allergen Reactions   Fentanyl Other (See Comments)    Sedated for days    Current Outpatient Medications  Medication Sig Dispense Refill   acetaminophen (TYLENOL) 500 MG tablet Take 2 tablets (1,000 mg total) by mouth every 6 (six) hours. 30 tablet 0   buprenorphine (SUBUTEX) 8 MG SUBL SL tablet Place 8 mg under the tongue in the morning, at noon, and at bedtime.     Buprenorphine HCl-Naloxone HCl 12-3 MG FILM Place 0.5 Film under the tongue in the morning, at noon, in the evening, and at bedtime.     Cholecalciferol (VITAMIN D3 PO) Take 1 tablet by mouth daily.     cloNIDine (CATAPRES) 0.1 MG tablet Take 0.1 mg by mouth daily.     docusate sodium (COLACE) 100 MG capsule Take 1 capsule (100 mg total) by mouth 2 (two) times daily.     enoxaparin (LOVENOX) 40 MG/0.4ML injection Inject 0.4 mLs (40 mg total) into the  skin daily. 12 mL 0   Multiple Vitamins-Minerals (MULTIVITAMIN WITH MINERALS) tablet Take 1 tablet by mouth daily.     orphenadrine (NORFLEX) 100 MG tablet Take 1 tablet (100 mg total) by mouth 2 (two) times daily. (Patient not taking: Reported on 12/14/2023) 10 tablet 0   rivaroxaban (XARELTO) 10 MG TABS tablet Take 1 tablet (10 mg total) by mouth daily. 30 tablet 0   sulfamethoxazole-trimethoprim (BACTRIM DS) 800-160 MG tablet Take 1 tablet by mouth 2 (two) times daily. Start the day prior to foley removal appointment 6 tablet 0   No current facility-administered medications for this visit.    REVIEW OF SYSTEMS:  [X]  denotes positive finding, [ ]  denotes negative finding Cardiac  Comments:  Chest pain or chest pressure: ***   Shortness of breath upon exertion:    Short of breath when lying flat:    Irregular heart rhythm:        Vascular    Pain in calf, thigh, or hip brought on by ambulation:    Pain in feet at night that wakes you up from your sleep:     Blood clot in your veins:    Leg swelling:         Pulmonary    Oxygen at home:    Productive cough:     Wheezing:         Neurologic    Sudden weakness in arms or legs:     Sudden numbness in arms or legs:     Sudden onset of difficulty speaking or slurred speech:    Temporary loss of vision in one eye:     Problems with dizziness:         Gastrointestinal    Blood in stool:     Vomited blood:         Genitourinary    Burning when urinating:     Blood in urine:        Psychiatric    Major depression:         Hematologic    Bleeding problems:    Problems with blood clotting too easily:        Skin    Rashes or ulcers:        Constitutional    Fever or chills:  PHYSICAL EXAM: There were no vitals filed for this visit.  GENERAL: The patient is a well-nourished male, in no acute distress. The vital signs are documented above. CARDIAC: There is a regular rate and rhythm.  VASCULAR: *** PULMONARY:  There is good air exchange bilaterally without wheezing or rales. ABDOMEN: Soft and non-tender with normal pitched bowel sounds.  MUSCULOSKELETAL: There are no major deformities or cyanosis. NEUROLOGIC: No focal weakness or paresthesias are detected. SKIN: There are no ulcers or rashes noted. PSYCHIATRIC: The patient has a normal affect.  DATA:   IVC/ILIAC STUDY  Patient Name:  BOLTON CANUPP  Date of Exam:   01/15/2024 Medical Rec #: [id number]            Accession #:    [id number] Date of Birth: 1955/11/24             Patient Gender: M Patient Age:   43 years Exam Location:  [facility] Procedure:      VAS [place] IVC/ILIAC (VENOUS ONLY) Referring Phys: Sherald Hess   --------------------------------------------------------------------------- -----   Indications: Surgery date 09/11/18: Left CIV and EIV stent. Right CIV and EIV              stent.  Other Factors: 12/24/23: Radical prostatectomy.                Currently on Lovenox post op.  Limitations: Air/bowel gas and recent surgery.    Comparison Study: 02/26/21: Patent IVC and bilateral iliac veins.  Performing Technologist: Thereasa Parkin RVT    Examination Guidelines: A complete evaluation includes B-mode imaging, spectral Doppler, color Doppler, and power Doppler as needed of all accessible portions of each vessel. Bilateral testing is considered an integral part of a complete examination. Limited examinations for reoccurring indications may be performed as noted.    IVC/Iliac Findings: +----------+------+--------+--------+    IVC    PatentThrombusComments +----------+------+--------+--------+ IVC Prox  patent                 +----------+------+--------+--------+ IVC Mid   patent                 +----------+------+--------+--------+ IVC Distalpatent                 +----------+------+--------+--------+     +-------------------+---------+-----------+---------+-----------+--------+         CIV        RT-PatentRT-ThrombusLT-PatentLT-ThrombusComments +-------------------+---------+-----------+---------+-----------+--------+ Common Iliac Prox   patent              patent                      +-------------------+---------+-----------+---------+-----------+--------+ Common Iliac Mid    patent              patent                      +-------------------+---------+-----------+---------+-----------+--------+ Common Iliac Distal patent              patent                      +-------------------+---------+-----------+---------+-----------+--------+     +-------------------------+---------+-----------+---------+-----------+---- ----+            EIV           RT-PatentRT-ThrombusLT-PatentLT-ThrombusComments +-------------------------+---------+-----------+---------+-----------+---- ----+ External Iliac Vein Prox  patent              patent                       +-------------------------+---------+-----------+---------+-----------+---- ----+  External Iliac Vein Mid   patent              patent                       +-------------------------+---------+-----------+---------+-----------+---- ----+ External Iliac Vein       patent              patent                       Distal                                                                     +-------------------------+---------+-----------+---------+-----------+---- ----+         Summary: IVC/Iliac: Patent IVC. Patent bilateral CIV and EIV.   *See table(s) above for measurements and observations.   Electronically signed by Carolynn Sayers on 01/15/2024 at 1:46:30 PM.    Assessment/Plan:  68 y.o. 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  Initially in 2019 he underwent tenolysis of  the left leg DVT ultimately requiring left common and external iliac vein stent with a Vici.  He later had a right leg DVT in 2021 requiring percutaneous thrombectomy with a right common iliac vein Wallstent.  Both of his iliac vein stents on the right and left look patent today.  He understands that he needs lifelong anticoagulation and is on Xarelto.  Will follow-up in 1 year with repeat imaging.   Cephus Shelling, MD [facility] Office: [phone]

## 2024-01-26 ENCOUNTER — Encounter: Payer: Self-pay | Admitting: Vascular Surgery

## 2024-01-26 ENCOUNTER — Ambulatory Visit: Payer: Medicare Other | Admitting: Vascular Surgery

## 2024-01-26 VITALS — BP 151/90 | HR 48 | Temp 97.7°F | Resp 18 | Ht 69.0 in | Wt 159.4 lb

## 2024-01-26 DIAGNOSIS — I82401 Acute embolism and thrombosis of unspecified deep veins of right lower extremity: Secondary | ICD-10-CM | POA: Diagnosis not present

## 2024-01-26 DIAGNOSIS — I82402 Acute embolism and thrombosis of unspecified deep veins of left lower extremity: Secondary | ICD-10-CM

## 2024-03-08 ENCOUNTER — Inpatient Hospital Stay: Admission: RE | Admit: 2024-03-08 | Payer: BC Managed Care – PPO | Source: Ambulatory Visit

## 2024-03-09 ENCOUNTER — Other Ambulatory Visit: Payer: Self-pay | Admitting: Family

## 2024-03-09 DIAGNOSIS — Z13820 Encounter for screening for osteoporosis: Secondary | ICD-10-CM

## 2024-04-28 ENCOUNTER — Encounter: Payer: Self-pay | Admitting: *Deleted

## 2024-04-28 DIAGNOSIS — C61 Malignant neoplasm of prostate: Secondary | ICD-10-CM

## 2024-05-11 ENCOUNTER — Inpatient Hospital Stay: Attending: Adult Health | Admitting: *Deleted

## 2024-05-11 ENCOUNTER — Encounter: Payer: Self-pay | Admitting: *Deleted

## 2024-05-11 DIAGNOSIS — C61 Malignant neoplasm of prostate: Secondary | ICD-10-CM

## 2024-05-11 NOTE — Progress Notes (Signed)
 SCP reviewed and completed. Two Identifiers were used today as this was a telephone visit. Allergies and meds reviewed and updated. Vaccines were discussed and reviewed. Last colonoscopy was 04/28/2019.Repeat set for 2030. Pt states he is doing very well. He no longer has to wear any pads or briefs for  urinary leakage. Nocturia x2. 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 Repeat in 6 months. A copy of prostate tx summary sent to PCP.

## 2024-07-25 ENCOUNTER — Other Ambulatory Visit: Payer: Self-pay | Admitting: Nurse Practitioner

## 2024-07-25 ENCOUNTER — Encounter: Payer: Self-pay | Admitting: Nurse Practitioner

## 2024-07-25 DIAGNOSIS — C61 Malignant neoplasm of prostate: Secondary | ICD-10-CM

## 2024-07-25 DIAGNOSIS — D4102 Neoplasm of uncertain behavior of left kidney: Secondary | ICD-10-CM

## 2024-07-25 DIAGNOSIS — R310 Gross hematuria: Secondary | ICD-10-CM

## 2024-08-29 ENCOUNTER — Encounter: Payer: Self-pay | Admitting: Nurse Practitioner

## 2024-09-07 ENCOUNTER — Other Ambulatory Visit: Payer: Self-pay | Admitting: Nurse Practitioner

## 2024-09-07 DIAGNOSIS — F17200 Nicotine dependence, unspecified, uncomplicated: Secondary | ICD-10-CM

## 2024-09-08 ENCOUNTER — Ambulatory Visit
Admission: RE | Admit: 2024-09-08 | Discharge: 2024-09-08 | Disposition: A | Discharge status: Home / Self Care | Source: Ambulatory Visit | Attending: Nurse Practitioner | Admitting: Nurse Practitioner

## 2024-09-08 DIAGNOSIS — I77810 Thoracic aortic ectasia: Secondary | ICD-10-CM | POA: Diagnosis not present

## 2024-09-08 DIAGNOSIS — J439 Emphysema, unspecified: Secondary | ICD-10-CM | POA: Insufficient documentation

## 2024-09-08 DIAGNOSIS — N200 Calculus of kidney: Secondary | ICD-10-CM | POA: Insufficient documentation

## 2024-09-08 DIAGNOSIS — Z122 Encounter for screening for malignant neoplasm of respiratory organs: Secondary | ICD-10-CM | POA: Insufficient documentation

## 2024-09-08 DIAGNOSIS — F17200 Nicotine dependence, unspecified, uncomplicated: Secondary | ICD-10-CM

## 2024-09-08 DIAGNOSIS — I700 Atherosclerosis of aorta: Secondary | ICD-10-CM | POA: Diagnosis not present

## 2024-09-08 DIAGNOSIS — F17210 Nicotine dependence, cigarettes, uncomplicated: Secondary | ICD-10-CM | POA: Diagnosis not present

## 2024-09-10 NOTE — Progress Notes (Unsigned)
 Cardiology Office Note  Date:  09/12/2024   ID:  Akshat, Minehart 1956/04/03, MRN [id number]  PCP:  Silvano Angeline FALCON, NP   Chief Complaint  Patient presents with   New Patient (Initial Visit)    Ref by Burnard Idell Rigg, NP for shortness of breath any amount of exertion.     HPI:  Thomas Huerta is a 68 y.o. male with past medical history of: Smoking <1 ppd Prostate adenocarcinoma DVT x 2, lifelong anticoagulation Aortic atherosclerosis Morbid obesity, status post gastric bypass surgery (119 lbs, required TPN), reversal up to 156 lbs Chronic anemia Chronic bradycardia Who presents by referral from Burnard Idell Rigg for shortness of breath, chest tightness  Prior records reviewed Outpatient echocardiogram September 2019 Normal ejection fraction at that time  Reports worsening shortness of breath, particularly noticeable in the morning, tightness Past several weeks, has noticed increasing symptoms Smokes daily less than 1 pack/day More fatigue, get tired, run out of gas quicker [place], [place] at [place]  No recent labs to review Prior hx of anemia  CT chest 10/25 Atherosclerotic thoracic aorta with dilated 4.2 cm ascending thoracic aorta.  EKG personally reviewed by myself on todays visit EKG Interpretation Date/Time:  Monday September 12 2024 08:57:33 EDT Ventricular Rate:  45 PR Interval:  298 QRS Duration:  110 QT Interval:  448 QTC Calculation: 387 R Axis:   -11  Text Interpretation: Sinus bradycardia with 1st degree A-V block Nonspecific ST abnormality When compared with ECG of 25-Jul-2020 06:45, PR interval has increased Questionable change in QRS axis ST now depressed in Anterior leads Confirmed by Perla Lye [phone]) on 09/12/2024 9:06:51 AM    Past Medical History:  Diagnosis Date   Back pain    chronic   Complication of anesthesia    over medicated with Fentanyl    History of kidney stones    5th time treating stones in 15 years    Obesity    history of prior to gastric bypass   Peripheral arterial disease    Peripheral vascular disease    Prostate cancer (HCC)      PMH:   has a past medical history of Back pain, Complication of anesthesia, History of kidney stones, Obesity, Peripheral arterial disease, Peripheral vascular disease, and Prostate cancer (HCC).  PSH:    Past Surgical History:  Procedure Laterality Date   ANGIOPLASTY ILLIAC ARTERY Left 09/11/2018   Procedure: BALLOON ANGIOPLASTY LEFT ILIAC VEIN;  Surgeon: Gretta Lonni PARAS, MD;  Location: [facility] OR;  Service: Vascular;  Laterality: Left;   CORONARY ULTRASOUND/IVUS N/A 09/11/2018   Procedure: INTRAVASCULAR ULTRASOUND/IVUS;  Surgeon: Gretta Lonni PARAS, MD;  Location: [facility] OR;  Service: Vascular;  Laterality: N/A;   CYST EXCISION     left lower back beside the spine   CYSTOSCOPY     CYSTOSCOPY W/ URETERAL STENT REMOVAL Left 08/18/2017   Procedure: CYSTOSCOPY WITH STENT REMOVAL;  Surgeon: Kassie Ozell SAUNDERS, MD;  Location: [facility] ORS;  Service: Urology;  Laterality: Left;   CYSTOSCOPY WITH STENT PLACEMENT Left 06/16/2017   Procedure: CYSTOSCOPY WITH STENT PLACEMENT;  Surgeon: Kassie Ozell SAUNDERS, MD;  Location: [facility] ORS;  Service: Urology;  Laterality: Left;   ESOPHAGOGASTRODUODENOSCOPY (EGD) WITH PROPOFOL  N/A 12/14/2018   Procedure: ESOPHAGOGASTRODUODENOSCOPY (EGD) WITH PROPOFOL ;  Surgeon: Unk Corinn Skiff, MD;  Location: [facility];  Service: Gastroenterology;  Laterality: N/A;   EXTRACORPOREAL SHOCK WAVE LITHOTRIPSY Left 06/25/2017   Procedure: EXTRACORPOREAL SHOCK WAVE LITHOTRIPSY (ESWL);  Surgeon: Kassie Ozell SAUNDERS,  MD;  Location: [facility] ORS;  Service: Urology;  Laterality: Left;   EXTRACORPOREAL SHOCK WAVE LITHOTRIPSY Left 08/06/2017   Procedure: EXTRACORPOREAL SHOCK WAVE LITHOTRIPSY (ESWL);  Surgeon: Kassie Ozell SAUNDERS, MD;  Location: [facility] ORS;  Service: Urology;  Laterality: Left;   EXTRACORPOREAL SHOCK WAVE LITHOTRIPSY Right 11/05/2017   Procedure:  EXTRACORPOREAL SHOCK WAVE LITHOTRIPSY (ESWL);  Surgeon: Kassie Ozell SAUNDERS, MD;  Location: [facility] ORS;  Service: Urology;  Laterality: Right;   GASTRIC BYPASS  2004   HERNIA REPAIR Bilateral    inguinal hernias   INSERTION OF ILIAC STENT Left 09/11/2018   Procedure: INSERTION OF LEFT ILIAC STENT;  Surgeon: Gretta Lonni PARAS, MD;  Location: [facility] OR;  Service: Vascular;  Laterality: Left;   LOWER EXTREMITY VENOGRAPHY Left 09/10/2018   Procedure: LOWER EXTREMITY VENOGRAPHY;  Surgeon: Harvey Carlin BRAVO, MD;  Location: [facility];  Service: Cardiovascular;  Laterality: Left;   LYMPHADENECTOMY Bilateral 12/24/2023   Procedure: BILATERAL PELVIC LYMPHADENECTOMY;  Surgeon: Renda Glance, MD;  Location: [facility] OR[facility];  Service: Urology;  Laterality: Bilateral;   MULTIPLE TOOTH EXTRACTIONS     PERIPHERAL VASCULAR INTERVENTION  07/25/2020   Procedure: PERIPHERAL VASCULAR INTERVENTION;  Surgeon: Gretta Lonni PARAS, MD;  Location: [facility];  Service: Cardiovascular;;  lower rt external and common femoral venous stent   PERIPHERAL VASCULAR THROMBECTOMY Left 09/10/2018   Procedure: PERIPHERAL VASCULAR THROMBECTOMY;  Surgeon: Harvey Carlin BRAVO, MD;  Location: [facility];  Service: Cardiovascular;  Laterality: Left;  lower extremity venous system.   PERIPHERAL VASCULAR THROMBECTOMY Right 07/25/2020   Procedure: PERIPHERAL VASCULAR THROMBECTOMY;  Surgeon: Gretta Lonni PARAS, MD;  Location: [facility];  Service: Cardiovascular;  Laterality: Right;   ROBOT ASSISTED LAPAROSCOPIC RADICAL PROSTATECTOMY N/A 12/24/2023   Procedure: XI ROBOTIC ASSISTED LAPAROSCOPIC RADICAL PROSTATECTOMY LEVEL 3;  Surgeon: Renda Glance, MD;  Location: [facility] ORS;  Service: Urology;  Laterality: N/A;  210 MINUTES NEEDED FOR CASE   TONSILLECTOMY     and addenoids   TONSILLECTOMY AND ADENOIDECTOMY     URETEROSCOPY WITH HOLMIUM LASER LITHOTRIPSY Left 06/16/2017   Procedure: URETEROSCOPY WITH HOLMIUM LASER  LITHOTRIPSY;  Surgeon: Kassie Ozell SAUNDERS, MD;  Location: [facility] OR[facility];  Service: Urology;  Laterality: Left;   VENOGRAM Left 09/11/2018   Procedure: VENOGRAM LEFT LEG;  Surgeon: Gretta Lonni PARAS, MD;  Location: [facility] OR;  Service: Vascular;  Laterality: Left;    Current Outpatient Medications  Medication Sig Dispense Refill   acetaminophen  (TYLENOL ) 500 MG tablet Take 2 tablets (1,000 mg total) by mouth every 6 (six) hours. 30 tablet 0   baclofen (LIORESAL) 10 MG tablet Take 10 mg by mouth 3 (three) times daily as needed.     Buprenorphine  HCl-Naloxone  HCl 12-3 MG FILM Place 0.5 Film under the tongue in the morning, at noon, in the evening, and at bedtime.     Cholecalciferol (VITAMIN D3 PO) Take 1 tablet by mouth daily.     Multiple Vitamins-Minerals (MULTIVITAMIN WITH MINERALS) tablet Take 1 tablet by mouth daily.     ELIQUIS  5 MG TABS tablet Take 5 mg by mouth 2 (two) times daily.     QUEtiapine (SEROQUEL) 25 MG tablet Take 25 mg by mouth at bedtime. (Patient not taking: Reported on 09/12/2024)     No current facility-administered medications for this visit.    Allergies:   Fentanyl    Social History:  The patient  reports that he has been smoking cigarettes. He has never used smokeless tobacco. He reports current  drug use. Drug: Oxycodone . He reports that he does not drink alcohol.   Family History:   family history includes Cancer (age of onset: 2) in his brother; Colon cancer (age of onset: 101) in his father; Lymphoma (age of onset: 38) in his mother; Lymphoma (age of onset: 79 - 36) in his maternal aunt; Prostate cancer (age of onset: 27) in his paternal uncle.    Review of Systems: Review of Systems  Constitutional: Negative.   HENT: Negative.    Respiratory:  Positive for shortness of breath.   Cardiovascular:  Positive for chest pain.  Gastrointestinal: Negative.   Musculoskeletal: Negative.   Neurological: Negative.   Psychiatric/Behavioral: Negative.    All other systems  reviewed and are negative.   PHYSICAL EXAM: VS:  BP (!) 140/82 (BP Location: Left Arm, Patient Position: Sitting, Cuff Size: Normal)   Pulse (!) 45   Ht 5' 9 (1.753 m)   Wt 156 lb 2 oz (70.8 kg)   SpO2 97%   BMI 23.06 kg/m  , BMI Body mass index is 23.06 kg/m. GEN: Well nourished, well developed, in no acute distress HEENT: normal Neck: no JVD, carotid bruits, or masses Cardiac: RRR; no murmurs, rubs, or gallops,no edema  Respiratory:  clear to auscultation bilaterally, normal work of breathing GI: soft, nontender, nondistended, + BS MS: no deformity or atrophy Skin: warm and dry, no rash Neuro:  Strength and sensation are intact Psych: euthymic mood, full affect  Recent Labs: 12/18/2023: BUN 19; Creatinine, Ser 1.00; Platelets 192; Potassium 3.8; Sodium 141 12/25/2023: Hemoglobin 10.5    Lipid Panel No results found for: CHOL, HDL, LDLCALC, TRIG    Wt Readings from Last 3 Encounters:  09/12/24 156 lb 2 oz (70.8 kg)  01/26/24 159 lb 6.4 oz (72.3 kg)  12/24/23 160 lb (72.6 kg)     ASSESSMENT AND PLAN:  Problem List Items Addressed This Visit   None Visit Diagnoses       Shortness of breath    -  Primary   Relevant Orders   EKG 12-Lead (Completed)   CBC   ECHOCARDIOGRAM COMPLETE     Precordial pain       Relevant Orders   Basic metabolic panel with GFR   CT CORONARY MORPH W/CTA COR W/SCORE W/CA W/CM &/OR WO/CM     Chest tightness         Angina pectoris       Relevant Medications   ELIQUIS  5 MG TABS tablet     Chronic sinus bradycardia       Relevant Medications   ELIQUIS  5 MG TABS tablet     Smoker          Chest tightness/angina /shortness of breath Has noticed symptoms on exertion past several weeks, also waking with shortness of breath Long history of smoking 1 pack/day Unable to treadmill Various options for workup discussed, will recommend cardiac CTA for close evaluation and rule out of ischemia -Echocardiogram also ordered for  symptoms  History of anemia Recent lab work not available from primary care, we have ordered CBC  Smoking We will encourage him to continue to work on weaning his cigarettes and smoking cessation.   Chronic asymptomatic bradycardia Heart rates in the 40s, blood pressure stable, Would avoid clonidine  which could lower heart rate  History of DVT x 2 On Eliquis  5 twice daily  Signed, Velinda Lunger, M.D., Ph.D. [facility], [facility] [place], [place] [phone]

## 2024-09-12 ENCOUNTER — Ambulatory Visit: Attending: Cardiovascular Disease | Admitting: Cardiovascular Disease

## 2024-09-12 ENCOUNTER — Encounter: Payer: Self-pay | Admitting: Cardiovascular Disease

## 2024-09-12 VITALS — BP 140/82 | HR 45 | Ht 69.0 in | Wt 156.1 lb

## 2024-09-12 DIAGNOSIS — R0789 Other chest pain: Secondary | ICD-10-CM

## 2024-09-12 DIAGNOSIS — I209 Angina pectoris, unspecified: Secondary | ICD-10-CM | POA: Diagnosis not present

## 2024-09-12 DIAGNOSIS — R001 Bradycardia, unspecified: Secondary | ICD-10-CM

## 2024-09-12 DIAGNOSIS — R0602 Shortness of breath: Secondary | ICD-10-CM | POA: Diagnosis not present

## 2024-09-12 DIAGNOSIS — F17200 Nicotine dependence, unspecified, uncomplicated: Secondary | ICD-10-CM

## 2024-09-12 DIAGNOSIS — R072 Precordial pain: Secondary | ICD-10-CM

## 2024-09-12 NOTE — Patient Instructions (Addendum)
 Medication Instructions:  No changes  If you need a refill on your cardiac medications before your next appointment, please call your pharmacy.   Lab work: Your provider would like for you to have following labs drawn today CBC, BMP.    Testing/Procedures: Your physician has requested that you have an echocardiogram. Echocardiography is a painless test that uses sound waves to create images of your heart. It provides your doctor with information about the size and shape of your heart and how well your heart's chambers and valves are working.   You may receive an ultrasound enhancing agent through an IV if needed to better visualize your heart during the echo. This procedure takes approximately one hour.  There are no restrictions for this procedure.  This will take place at [address] [facility] [address], [place] [address]  Please note: We ask at that you not bring children with you during ultrasound (echo/ vascular) testing. Due to room size and safety concerns, children are not allowed in the ultrasound rooms during exams. Our front office staff cannot provide observation of children in our lobby area while testing is being conducted. An adult accompanying a patient to their appointment will only be allowed in the ultrasound room at the discretion of the ultrasound technician under special circumstances. We apologize for any inconvenience.     Your cardiac CT will be scheduled at one of the below locations:   [facility] [address] [place], [place] [address] [phone]  If scheduled at [facility] or [facility], please arrive 15 mins early for check-in and test prep.  There is spacious parking and easy access to the radiology department from the [facility] entrance. Please enter here and check-in with the desk attendant.   Please follow these instructions carefully (unless  otherwise directed):  An IV will be required for this test and Nitroglycerin will be given.  Hold all erectile dysfunction medications at least 3 days (72 hrs) prior to test. (Ie viagra, cialis, sildenafil, tadalafil, etc)   On the Night Before the Test: Be sure to Drink plenty of water . Do not consume any caffeinated/decaffeinated beverages or chocolate 12 hours prior to your test. Do not take any antihistamines 12 hours prior to your test.  On the Day of the Test: Drink plenty of water  until 1 hour prior to the test. Do not eat any food 1 hour prior to test. You may take your regular medications prior to the test.  Patients who wear a continuous glucose monitor MUST remove the device prior to scanning. FEMALES- please wear underwire-free bra if available, avoid dresses & tight clothing       After the Test: Drink plenty of water . After receiving IV contrast, you may experience a mild flushed feeling. This is normal. On occasion, you may experience a mild rash up to 24 hours after the test. This is not dangerous. If this occurs, you can take Benadryl  25 mg, Zyrtec, Claritin, or Allegra and increase your fluid intake. (Patients taking Tikosyn should avoid Benadryl , and may take Zyrtec, Claritin, or Allegra) If you experience trouble breathing, this can be serious. If it is severe call 911 IMMEDIATELY. If it is mild, please call our office.  We will call to schedule your test 2-4 weeks out understanding that some insurance companies will need an authorization prior to the service being performed.   For more information and frequently asked questions, please visit  our website : [email or web address]  For non-scheduling related questions, please contact the cardiac imaging nurse navigator should you have any questions/concerns: Cardiac Imaging Nurse Navigators Direct Office Dial: [phone]   For scheduling needs, including cancellations and rescheduling, please call  [place], [phone].    Follow-Up: At [facility], you and your health needs are our priority.  As part of our continuing mission to provide you with exceptional heart care, we have created designated Provider Care Teams.  These Care Teams include your primary Cardiologist (physician) and Advanced Practice Providers (APPs -  Physician Assistants and Nurse Practitioners) who all work together to provide you with the care you need, when you need it.  You will need a follow up appointment as needed  Providers on your designated Care Team:   Lonni Meager, NP Bernardino Bring, PA-C Cadence [place], [place]  COVID-19 Vaccine Information can be found at: [email or web address] For questions related to vaccine distribution or appointments, please email [email or web address] or call [phone].

## 2024-09-13 LAB — BASIC METABOLIC PANEL WITH GFR
BUN/Creatinine Ratio: 17 (ref 10–24)
BUN: 19 mg/dL (ref 8–27)
CO2: 23 mmol/L (ref 20–29)
Calcium: 9.3 mg/dL (ref 8.6–10.2)
Chloride: 106 mmol/L (ref 96–106)
Creatinine, Ser: 1.13 mg/dL (ref 0.76–1.27)
Glucose: 61 mg/dL — ABNORMAL LOW (ref 70–99)
Potassium: 4.6 mmol/L (ref 3.5–5.2)
Sodium: 144 mmol/L (ref 134–144)
eGFR: 72 mL/min/1.73 (ref >59)

## 2024-09-13 LAB — CBC
Hematocrit: 41 % (ref 37.5–51.0)
Hemoglobin: 12.9 g/dL — ABNORMAL LOW (ref 13.0–17.7)
MCH: 29.9 pg (ref 26.6–33.0)
MCHC: 31.5 g/dL (ref 31.5–35.7)
MCV: 95 fL (ref 79–97)
Platelets: 201 x10E3/uL (ref 150–450)
RBC: 4.32 x10E6/uL (ref 4.14–5.80)
RDW: 14.2 % (ref 11.6–15.4)
WBC: 3.8 x10E3/uL (ref 3.4–10.8)

## 2024-09-17 ENCOUNTER — Ambulatory Visit: Payer: Self-pay | Admitting: Cardiovascular Disease

## 2024-09-26 ENCOUNTER — Ambulatory Visit
Admission: RE | Admit: 2024-09-26 | Discharge: 2024-09-26 | Disposition: A | Source: Ambulatory Visit | Attending: Nurse Practitioner

## 2024-09-26 DIAGNOSIS — R310 Gross hematuria: Secondary | ICD-10-CM

## 2024-09-26 DIAGNOSIS — D4102 Neoplasm of uncertain behavior of left kidney: Secondary | ICD-10-CM

## 2024-09-26 DIAGNOSIS — C61 Malignant neoplasm of prostate: Secondary | ICD-10-CM

## 2024-09-26 MED ORDER — GADOPICLENOL 0.5 MMOL/ML IV SOLN
7.5000 mL | Freq: Once | INTRAVENOUS | Status: AC | PRN
  Administered 2024-09-26: 7.5 mL via INTRAVENOUS

## 2024-09-27 ENCOUNTER — Encounter (HOSPITAL_COMMUNITY): Payer: Self-pay

## 2024-09-28 ENCOUNTER — Telehealth (HOSPITAL_COMMUNITY): Payer: Self-pay | Admitting: Emergency Medicine

## 2024-09-28 NOTE — Telephone Encounter (Signed)
 Reaching out to patient to offer assistance regarding upcoming cardiac imaging study; pt verbalizes understanding of appt date/time, parking situation and where to check in, pre-test NPO status and medications ordered, and verified current allergies; name and call back number provided for further questions should they arise Rockwell [place] RN Navigator Cardiac Imaging [facility] [phone] office [phone] cell

## 2024-09-29 ENCOUNTER — Ambulatory Visit
Admission: RE | Admit: 2024-09-29 | Discharge: 2024-09-29 | Disposition: A | Discharge status: Home / Self Care | Source: Ambulatory Visit | Attending: Cardiovascular Disease | Admitting: Cardiovascular Disease

## 2024-09-29 DIAGNOSIS — R0602 Shortness of breath: Secondary | ICD-10-CM | POA: Insufficient documentation

## 2024-09-29 DIAGNOSIS — I209 Angina pectoris, unspecified: Secondary | ICD-10-CM | POA: Insufficient documentation

## 2024-09-29 DIAGNOSIS — R072 Precordial pain: Secondary | ICD-10-CM | POA: Insufficient documentation

## 2024-09-29 DIAGNOSIS — R0789 Other chest pain: Secondary | ICD-10-CM | POA: Diagnosis present

## 2024-09-29 MED ORDER — IOHEXOL 350 MG/ML SOLN
100.0000 mL | Freq: Once | INTRAVENOUS | Status: AC | PRN
  Administered 2024-09-29: 100 mL via INTRAVENOUS

## 2024-09-29 MED ORDER — NITROGLYCERIN 0.4 MG SL SUBL
0.8000 mg | SUBLINGUAL_TABLET | Freq: Once | SUBLINGUAL | Status: AC
  Administered 2024-09-29: 0.8 mg via SUBLINGUAL
  Filled 2024-09-29: qty 25

## 2024-09-29 NOTE — Progress Notes (Signed)
 Patient tolerated CT well. Vital signs stable encourage to drink water throughout day.Reasons explained and verbalized understanding. Ambulated steady gait.

## 2024-09-30 ENCOUNTER — Encounter: Payer: Self-pay | Admitting: Emergency Medicine

## 2024-10-24 ENCOUNTER — Ambulatory Visit: Attending: Cardiovascular Disease

## 2024-11-23 ENCOUNTER — Other Ambulatory Visit
